# Patient Record
Sex: Female | Born: 1970
Health system: Southern US, Community
[De-identification: ages and names within clinical notes are randomized; demographics above are authoritative.]

## PROBLEM LIST (undated history)

## (undated) ENCOUNTER — Emergency Department (HOSPITAL_COMMUNITY): Admission: EM | Disposition: A | Discharge status: Home / Self Care | Payer: BC Managed Care – PPO

## (undated) ENCOUNTER — Emergency Department (HOSPITAL_COMMUNITY): Admission: EM | Payer: Medicaid Other | Source: Home / Self Care

## (undated) ENCOUNTER — Emergency Department (HOSPITAL_COMMUNITY): Payer: BC Managed Care – PPO

## (undated) DIAGNOSIS — E119 Type 2 diabetes mellitus without complications: Secondary | ICD-10-CM

## (undated) DIAGNOSIS — Z8613 Personal history of malaria: Secondary | ICD-10-CM

## (undated) DIAGNOSIS — I10 Essential (primary) hypertension: Secondary | ICD-10-CM

## (undated) DIAGNOSIS — D61818 Other pancytopenia: Secondary | ICD-10-CM

## (undated) DIAGNOSIS — B1920 Unspecified viral hepatitis C without hepatic coma: Secondary | ICD-10-CM

## (undated) HISTORY — PX: FOOT SURGERY: SHX648

## (undated) HISTORY — DX: Personal history of malaria: Z86.13

## (undated) HISTORY — DX: Unspecified viral hepatitis C without hepatic coma: B19.20

## (undated) HISTORY — DX: Other pancytopenia: D61.818

---

## 2014-11-11 ENCOUNTER — Emergency Department (INDEPENDENT_AMBULATORY_CARE_PROVIDER_SITE_OTHER)
Admission: EM | Admit: 2014-11-11 | Discharge: 2014-11-11 | Disposition: A | Discharge status: Nursing Home (Includes ICF) | Payer: Medicaid Other | Source: Home / Self Care | Attending: Emergency Medicine | Admitting: Emergency Medicine

## 2014-11-11 ENCOUNTER — Emergency Department (HOSPITAL_COMMUNITY)
Admission: EM | Admit: 2014-11-11 | Discharge: 2014-11-11 | Disposition: A | Discharge status: Home / Self Care | Payer: Medicaid Other | Attending: Emergency Medicine | Admitting: Emergency Medicine

## 2014-11-11 ENCOUNTER — Encounter (HOSPITAL_COMMUNITY): Payer: Self-pay | Admitting: Emergency Medicine

## 2014-11-11 ENCOUNTER — Emergency Department (HOSPITAL_COMMUNITY): Payer: Medicaid Other

## 2014-11-11 DIAGNOSIS — R109 Unspecified abdominal pain: Secondary | ICD-10-CM | POA: Insufficient documentation

## 2014-11-11 DIAGNOSIS — D72819 Decreased white blood cell count, unspecified: Secondary | ICD-10-CM

## 2014-11-11 DIAGNOSIS — D696 Thrombocytopenia, unspecified: Secondary | ICD-10-CM

## 2014-11-11 DIAGNOSIS — R161 Splenomegaly, not elsewhere classified: Secondary | ICD-10-CM

## 2014-11-11 DIAGNOSIS — R10819 Abdominal tenderness, unspecified site: Secondary | ICD-10-CM

## 2014-11-11 DIAGNOSIS — D649 Anemia, unspecified: Secondary | ICD-10-CM | POA: Diagnosis not present

## 2014-11-11 DIAGNOSIS — K7689 Other specified diseases of liver: Secondary | ICD-10-CM

## 2014-11-11 DIAGNOSIS — R6881 Early satiety: Secondary | ICD-10-CM

## 2014-11-11 DIAGNOSIS — R140 Abdominal distension (gaseous): Secondary | ICD-10-CM

## 2014-11-11 LAB — I-STAT CHEM 8, ED
BUN: 17 mg/dL (ref 6–23)
CALCIUM ION: 1.17 mmol/L (ref 1.12–1.23)
CREATININE: 0.6 mg/dL (ref 0.50–1.10)
Chloride: 105 mEq/L (ref 96–112)
Glucose, Bld: 109 mg/dL — ABNORMAL HIGH (ref 70–99)
HCT: 32 % — ABNORMAL LOW (ref 36.0–46.0)
Hemoglobin: 10.9 g/dL — ABNORMAL LOW (ref 12.0–15.0)
Potassium: 4 mEq/L (ref 3.7–5.3)
SODIUM: 141 meq/L (ref 137–147)
TCO2: 24 mmol/L (ref 0–100)

## 2014-11-11 LAB — POCT URINALYSIS DIP (DEVICE)
Bilirubin Urine: NEGATIVE
Glucose, UA: NEGATIVE mg/dL
Hgb urine dipstick: NEGATIVE
Ketones, ur: NEGATIVE mg/dL
Leukocytes, UA: NEGATIVE
Nitrite: NEGATIVE
PH: 7 (ref 5.0–8.0)
PROTEIN: NEGATIVE mg/dL
SPECIFIC GRAVITY, URINE: 1.015 (ref 1.005–1.030)
UROBILINOGEN UA: 2 mg/dL — AB (ref 0.0–1.0)

## 2014-11-11 LAB — URINALYSIS, ROUTINE W REFLEX MICROSCOPIC
Bilirubin Urine: NEGATIVE
Glucose, UA: NEGATIVE mg/dL
Ketones, ur: NEGATIVE mg/dL
Leukocytes, UA: NEGATIVE
Nitrite: NEGATIVE
Protein, ur: NEGATIVE mg/dL
SPECIFIC GRAVITY, URINE: 1.017 (ref 1.005–1.030)
UROBILINOGEN UA: 1 mg/dL (ref 0.0–1.0)
pH: 7 (ref 5.0–8.0)

## 2014-11-11 LAB — CBC WITH DIFFERENTIAL/PLATELET
BASOS ABS: 0 10*3/uL (ref 0.0–0.1)
Basophils Relative: 0 % (ref 0–1)
EOS ABS: 0 10*3/uL (ref 0.0–0.7)
Eosinophils Relative: 2 % (ref 0–5)
HEMATOCRIT: 29.9 % — AB (ref 36.0–46.0)
Hemoglobin: 9.5 g/dL — ABNORMAL LOW (ref 12.0–15.0)
LYMPHS ABS: 0.6 10*3/uL — AB (ref 0.7–4.0)
LYMPHS PCT: 38 % (ref 12–46)
MCH: 23.9 pg — AB (ref 26.0–34.0)
MCHC: 31.8 g/dL (ref 30.0–36.0)
MCV: 75.1 fL — ABNORMAL LOW (ref 78.0–100.0)
MONO ABS: 0.1 10*3/uL (ref 0.1–1.0)
Monocytes Relative: 7 % (ref 3–12)
NEUTROS ABS: 0.8 10*3/uL — AB (ref 1.7–7.7)
Neutrophils Relative %: 53 % (ref 43–77)
Platelets: 38 10*3/uL — ABNORMAL LOW (ref 150–400)
RBC: 3.98 MIL/uL (ref 3.87–5.11)
RDW: 15 % (ref 11.5–15.5)
WBC: 1.5 10*3/uL — ABNORMAL LOW (ref 4.0–10.5)

## 2014-11-11 LAB — COMPREHENSIVE METABOLIC PANEL
ALBUMIN: 3.6 g/dL (ref 3.5–5.2)
ALT: 30 U/L (ref 0–35)
AST: 39 U/L — ABNORMAL HIGH (ref 0–37)
Alkaline Phosphatase: 55 U/L (ref 39–117)
Anion gap: 12 (ref 5–15)
BUN: 16 mg/dL (ref 6–23)
CALCIUM: 9.1 mg/dL (ref 8.4–10.5)
CO2: 24 mEq/L (ref 19–32)
Chloride: 104 mEq/L (ref 96–112)
Creatinine, Ser: 0.59 mg/dL (ref 0.50–1.10)
GFR calc Af Amer: >90 mL/min (ref >90)
GFR calc non Af Amer: >90 mL/min (ref >90)
GLUCOSE: 104 mg/dL — AB (ref 70–99)
Potassium: 4.1 mEq/L (ref 3.7–5.3)
Sodium: 140 mEq/L (ref 137–147)
TOTAL PROTEIN: 8.3 g/dL (ref 6.0–8.3)
Total Bilirubin: 0.4 mg/dL (ref 0.3–1.2)

## 2014-11-11 LAB — URINE MICROSCOPIC-ADD ON

## 2014-11-11 LAB — POC OCCULT BLOOD, ED: FECAL OCCULT BLD: NEGATIVE

## 2014-11-11 LAB — POCT PREGNANCY, URINE: PREG TEST UR: NEGATIVE

## 2014-11-11 LAB — LIPASE, BLOOD: LIPASE: 32 U/L (ref 11–59)

## 2014-11-11 MED ORDER — HYDROCODONE-ACETAMINOPHEN 5-325 MG PO TABS
1.0000 | ORAL_TABLET | ORAL | Status: DC | PRN
Start: 2014-11-11 — End: 2014-12-08

## 2014-11-11 MED ORDER — SODIUM CHLORIDE 0.9 % IV BOLUS (SEPSIS)
1000.0000 mL | Freq: Once | INTRAVENOUS | Status: AC
  Administered 2014-11-11: 1000 mL via INTRAVENOUS

## 2014-11-11 MED ORDER — IOHEXOL 300 MG/ML  SOLN
100.0000 mL | Freq: Once | INTRAMUSCULAR | Status: AC | PRN
  Administered 2014-11-11: 100 mL via INTRAVENOUS

## 2014-11-11 MED ORDER — MORPHINE SULFATE 4 MG/ML IJ SOLN
4.0000 mg | Freq: Once | INTRAMUSCULAR | Status: AC
  Administered 2014-11-11: 4 mg via INTRAVENOUS
  Filled 2014-11-11: qty 1

## 2014-11-11 MED ORDER — ONDANSETRON HCL 4 MG/2ML IJ SOLN
4.0000 mg | Freq: Once | INTRAMUSCULAR | Status: AC
  Administered 2014-11-11: 4 mg via INTRAVENOUS
  Filled 2014-11-11: qty 2

## 2014-11-11 MED ORDER — IOHEXOL 300 MG/ML  SOLN
25.0000 mL | INTRAMUSCULAR | Status: AC
  Administered 2014-11-11: 25 mL via ORAL

## 2014-11-11 NOTE — ED Notes (Signed)
Pt finished oral contrast. CT made aware.

## 2014-11-11 NOTE — ED Notes (Signed)
Pt instructed via interpreter line to follow up. Pt with representative from refugee agency. Who verbalizes understanding as well as via Arts administrator. Pt is ambulatory.

## 2014-11-11 NOTE — ED Provider Notes (Signed)
CSN: [id number]     Arrival date & time 11/11/14  1217 History   First MD Initiated Contact with Patient 11/11/14 1247     Chief Complaint  Patient presents with  . Abdominal Pain     (Consider location/radiation/quality/duration/timing/severity/associated sxs/prior Treatment) HPI  Katherine Garza is a 43 y.o. female presenting with intermittent abdominal pain and distention that she has had for one year. Most recent episode started October 23. Patient states she is in pain now. She denies any nausea, vomiting. When she has the pain she endorses diarrhea with bloody mucus. Otherwise she her stool is regular every other day. Patient denies any urinary or vaginal complaints. Patient states she has a decreased appetite and early satiety. Patient endorses tactile fevers and chills. No weight loss. LMP on 11/54/2015. Stated this was first menses in several months. Patient recently came to [place] in October from [place] in as refugee for 19 years.  History obtained via Optometrist. Patient only speaks Swahili. History obtained from patient. Family at bedside.  History reviewed. No pertinent past medical history. History reviewed. No pertinent past surgical history. No family history on file. History  Substance Use Topics  . Smoking status: Never Smoker   . Smokeless tobacco: Not on file  . Alcohol Use: No   OB History    No data available     Review of Systems  Constitutional: Positive for fever and chills.  Respiratory: Negative for shortness of breath.   Cardiovascular: Negative for chest pain.  Gastrointestinal: Positive for abdominal pain, diarrhea and abdominal distention. Negative for nausea and vomiting.  Genitourinary: Negative for dysuria and hematuria.  Musculoskeletal: Negative for back pain.  Skin: Negative for rash.  Neurological: Negative for headaches.      Allergies  Review of patient's allergies indicates no known allergies.  Home Medications   Prior to  Admission medications   Medication Sig Start Date End Date Taking? Authorizing Provider  HYDROcodone-acetaminophen (NORCO/VICODIN) 5-325 MG per tablet Take 1-2 tablets by mouth every 4 (four) hours as needed for moderate pain or severe pain. 11/11/14   Jordan Hawks L Tyrrell Stephens, PA-C   BP 118/73 mmHg  Pulse 77  Temp(Src) 97.8 F (36.6 C) (Oral)  Resp 19  SpO2 100%  LMP 11/03/2014 Physical Exam  Constitutional: She appears well-developed and well-nourished. No distress.  HENT:  Head: Normocephalic and atraumatic.  Eyes: Conjunctivae and EOM are normal. Right eye exhibits no discharge. Left eye exhibits no discharge.  Cardiovascular: Normal rate, regular rhythm and normal heart sounds.   Pulmonary/Chest: Effort normal and breath sounds normal. No respiratory distress. She has no wheezes.  Abdominal: Soft. Bowel sounds are normal.  Abdomen soft with diffuse tenderness worse in left lower quadrant. Patient with distention. Enlarged spleen noted but no masses felt. No back tenderness or CVA tenderness.  Neurological: She is alert. She exhibits normal muscle tone. Coordination normal.  Skin: Skin is warm and dry. She is not diaphoretic.  Nursing note and vitals reviewed.   ED Course  Procedures (including critical care time) Labs Review Labs Reviewed  CBC WITH DIFFERENTIAL - Abnormal; Notable for the following:    WBC 1.5 (*)    Hemoglobin 9.5 (*)    HCT 29.9 (*)    MCV 75.1 (*)    MCH 23.9 (*)    Platelets 38 (*)    Neutro Abs 0.8 (*)    Lymphs Abs 0.6 (*)    All other components within normal limits  COMPREHENSIVE METABOLIC PANEL - Abnormal; Notable  for the following:    Glucose, Bld 104 (*)    AST 39 (*)    All other components within normal limits  URINALYSIS, ROUTINE W REFLEX MICROSCOPIC - Abnormal; Notable for the following:    Hgb urine dipstick SMALL (*)    All other components within normal limits  I-STAT CHEM 8, ED - Abnormal; Notable for the following:    Glucose, Bld 109  (*)    Hemoglobin 10.9 (*)    HCT 32.0 (*)    All other components within normal limits  LIPASE, BLOOD  URINE MICROSCOPIC-ADD ON  POC OCCULT BLOOD, ED    Imaging Review Ct Abdomen Pelvis W Contrast  11/11/2014   CLINICAL DATA:  Acute left-sided abdominal pain.  EXAM: CT ABDOMEN AND PELVIS WITH CONTRAST  TECHNIQUE: Multidetector CT imaging of the abdomen and pelvis was performed using the standard protocol following bolus administration of intravenous contrast.  CONTRAST:  169mL OMNIPAQUE IOHEXOL 300 MG/ML  SOLN  COMPARISON:  None.  FINDINGS: Mild degenerative disc disease is noted at L3-4. Visualized lung bases appear normal.  No gallstones are noted. Gallbladder is mildly distended, but no surrounding inflammation or gallbladder wall thickening is noted. Nodular hepatic margins are noted suggesting hepatic cirrhosis. Portal veins are dilated but patent. Splenic vein is also dilated. Severe splenomegaly is noted. Pancreas appears normal. Adrenal glands appear normal. No hydronephrosis or renal obstruction is noted. Bilateral simple renal cysts are noted. Small nonobstructive calculus is noted in upper pole collecting system of left kidney. Multiple small ill-defined low densities are noted in the liver, as well as ill-defined abnormality seen in anterior segment of right hepatic lobe. The left kidney is displaced by the splenomegaly. The appendix appears normal. There is no evidence of bowel obstruction. No abnormal fluid collection is noted. Urinary bladder is decompressed. Uterus appears normal. Ovaries appear normal. No significant adenopathy is noted.  IMPRESSION: Nodular hepatic margins are noted suggesting hepatic cirrhosis. Severe splenomegaly is noted with dilatation of the portal and splenic vein suggesting portal hypertension. Multiple small ill-defined hypodensities are noted in hepatic parenchyma, as well as ill-defined complex abnormality seen in anterior segment of right hepatic lobe. MRI is  recommended to rule out possible underlying hepatocellular carcinoma.  Small nonobstructive left renal calculus is noted. Bilateral renal cysts are noted. No hydronephrosis or renal obstruction is noted.   Electronically Signed   By: Sabino Dick M.D.   On: 11/11/2014 16:16     EKG Interpretation None      MDM   Final diagnoses:  Abdominal tenderness  Splenomegaly  Liver nodule  Mild anemia  Leukopenia   Pt is [place] refugee who has recently come to the [place] and without prior medical work up for intermittent left sided abdominal pain with abdominal distention for one year and ocassional bloody stool. Pt in no distress. VSS. Diffuse abdominal tenderness worse on left with enlarged spleen noted. No rebound or signs of peritonitis. Pt labs with mild anemia, leukocytosis, thrombocytosis and no electrolyte abnormalities. Mild elevation of AST but no other LFT or kidney function abnormalities. Lipase negative and UA without signs of infection. Hemoccult negative. CT with severe splenomegaly and liver changes suggesting cirrhosis and portal hypertension. Also noted are complex abnormalities in hepatic parenchyma possibly representing hepatocellular carcinoma. Pt was informed of these findings via phone translator. Pt without a PCP. Pt to follow up with the wellness center for further evaluation of these findings. A case management intern, Claiborne Billings, has been assigned to the case  to help facilitate pt getting her pain medications and establishing follow up as well as getting in contact with the refugee agency the pt have been working with. It has been determined that no acute conditions requiring further emergency intervention are present at this time. Patient is afebrile, nontoxic, and in no acute distress. Patient is appropriate for outpatient management and is stable for discharge.  Discussed return precautions with patient. Discussed all results and patient verbalizes understanding and agrees with  plan.  Case has been discussed with Dr. Mingo Amber who agrees with the above plan and to discharge.     Pura Spice, PA-C 11/12/14 1108  Evelina Bucy, MD 11/12/14 1245

## 2014-11-11 NOTE — Discharge Instructions (Signed)
Return to the emergency room with worsening of symptoms, new symptoms or with symptoms that are concerning. Ibuprofen 400mg  (2 tablets 200mg ) every 5-6 hours for 3-5 days and then as needed for pain.\ Norco for severe pain. Do not operate machinery, drive or drink alcohol while taking narcotics or muscle relaxers. Call to make appointment with the wellness center as soon as possible.   IMPRESSION: Nodular hepatic margins are noted suggesting hepatic cirrhosis. Severe splenomegaly is noted with dilatation of the portal and splenic vein suggesting portal hypertension. Multiple small ill-defined hypodensities are noted in hepatic parenchyma, as well as ill-defined complex abnormality seen in anterior segment of right hepatic lobe. MRI is recommended to rule out possible underlying hepatocellular carcinoma.  Small nonobstructive left renal calculus is noted. Bilateral renal cysts are noted. No hydronephrosis or renal obstruction is noted.

## 2014-11-11 NOTE — ED Notes (Addendum)
Pt ONLY SPEAKS SWAHILI.  HAS pain in left abd, states has been hurting since October 23-- that is when she came to the [place], from [place] as a refugee for 19 years. No nausea/vomiting, when having pain, pt states has diarrhea. Intermittent.  States has blood in stool-- red mixed with mucus.

## 2014-11-11 NOTE — ED Notes (Addendum)
Via Swahili phone interpreter Pt c/o intermittent abd pain onset 1 year; recently flared up on 11/21 and has been more constant Arrived to [place] recently; no PCP LMP = 11/24; reports its first period she has seen in 7 months Alert, no signs of acute distress.

## 2014-11-11 NOTE — ED Provider Notes (Signed)
CSN: [id number]     Arrival date & time 11/11/14  1019 History   First MD Initiated Contact with Patient 11/11/14 1040     Chief Complaint  Patient presents with  . Abdominal Pain   (Consider location/radiation/quality/duration/timing/severity/associated sxs/prior Treatment) HPI Comments: 43 year old female from [place] has been in the [place] for one month. She is complaining of chronic intermittent abdominal pain and swelling. Her most recent episode began approximately 10/31/2014. She developed a distended abdomen with pain primarily to the left hemiabdomen and epigastrium. The pain is unchanged in the past week. She has not sought medical attention for this in the past. She denies nausea, vomiting. She states she has a bowel movement approximately every other day. She has decreased appetite and early satiety. Denies weight loss. LMP was on 11/03/2014, however that was the first menses in 7 months.   History reviewed. No pertinent past medical history. History reviewed. No pertinent past surgical history. No family history on file. History  Substance Use Topics  . Smoking status: Never Smoker   . Smokeless tobacco: Not on file  . Alcohol Use: No   OB History    No data available     Review of Systems  Constitutional: Positive for activity change and fatigue. Negative for fever.  HENT: Negative.   Respiratory: Negative for cough and shortness of breath.   Gastrointestinal: Positive for abdominal pain and abdominal distention. Negative for vomiting, diarrhea and constipation.  Genitourinary: Negative.  Negative for dysuria, urgency, frequency, vaginal bleeding and pelvic pain.  Skin: Negative.     Allergies  Review of patient's allergies indicates no known allergies.  Home Medications   Prior to Admission medications   Not on File   BP 149/87 mmHg  Pulse 79  Temp(Src) 99 F (37.2 C) (Oral)  Resp 14  SpO2 98%  LMP 11/03/2014 Physical Exam  Constitutional: She  appears well-developed. No distress.  Eyes: EOM are normal.  Neck: Normal range of motion. Neck supple.  Cardiovascular: Normal rate, regular rhythm and normal heart sounds.   Pulmonary/Chest: Effort normal and breath sounds normal. No respiratory distress. She has no wheezes. She has no rales.  Abdominal: Soft. She exhibits distension. There is tenderness. There is no rebound and no guarding.  Abdomen percusses tympanic. Tenderness left hemiabdomen, lesser to right. No RLQ tenderness.  Musculoskeletal: She exhibits no edema.  Neurological: She is alert. She exhibits normal muscle tone.  Skin: Skin is warm and dry.  Psychiatric: She has a normal mood and affect.  Nursing note and vitals reviewed.   ED Course  Procedures (including critical care time) Labs Review Labs Reviewed  POCT URINALYSIS DIP (DEVICE) - Abnormal; Notable for the following:    Urobilinogen, UA 2.0 (*)    All other components within normal limits  POCT PREGNANCY, URINE   Results for orders placed or performed during the hospital encounter of 11/11/14  POCT urinalysis dip (device)  Result Value Ref Range   Glucose, UA NEGATIVE NEGATIVE mg/dL   Bilirubin Urine NEGATIVE NEGATIVE   Ketones, ur NEGATIVE NEGATIVE mg/dL   Specific Gravity, Urine 1.015 1.005 - 1.030   Hgb urine dipstick NEGATIVE NEGATIVE   pH 7.0 5.0 - 8.0   Protein, ur NEGATIVE NEGATIVE mg/dL   Urobilinogen, UA 2.0 (H) 0.0 - 1.0 mg/dL   Nitrite NEGATIVE NEGATIVE   Leukocytes, UA NEGATIVE NEGATIVE  Pregnancy, urine POC  Result Value Ref Range   Preg Test, Ur NEGATIVE NEGATIVE     Imaging Review  No results found.   MDM   1. Abdominal pain in female   2. Abdominal distension   3. Early satiety      Although the assistance with the interpreter is helpful there seems to be some loss in translation. Uncertain etiology for abdominal pain. Consider ileus, constipation, parasitic dz, mass, partial obstruction. Transfer to to the ED for  adequate evaluation via shuttle.     Janne Napoleon, NP 11/11/14 1124

## 2014-11-12 LAB — PATHOLOGIST SMEAR REVIEW

## 2014-11-27 ENCOUNTER — Ambulatory Visit (INDEPENDENT_AMBULATORY_CARE_PROVIDER_SITE_OTHER): Payer: Medicaid Other | Admitting: Internal Medicine

## 2014-11-27 ENCOUNTER — Encounter: Payer: Self-pay | Admitting: Internal Medicine

## 2014-11-27 VITALS — BP 139/78 | HR 86 | Temp 98.6°F | Ht 63.4 in | Wt 156.8 lb

## 2014-11-27 DIAGNOSIS — R161 Splenomegaly, not elsewhere classified: Secondary | ICD-10-CM

## 2014-11-27 DIAGNOSIS — R109 Unspecified abdominal pain: Secondary | ICD-10-CM

## 2014-11-27 DIAGNOSIS — K219 Gastro-esophageal reflux disease without esophagitis: Secondary | ICD-10-CM | POA: Insufficient documentation

## 2014-11-27 DIAGNOSIS — D61818 Other pancytopenia: Secondary | ICD-10-CM | POA: Insufficient documentation

## 2014-11-27 DIAGNOSIS — R1012 Left upper quadrant pain: Secondary | ICD-10-CM

## 2014-11-27 DIAGNOSIS — K7460 Unspecified cirrhosis of liver: Secondary | ICD-10-CM

## 2014-11-27 DIAGNOSIS — R1013 Epigastric pain: Secondary | ICD-10-CM

## 2014-11-27 HISTORY — DX: Splenomegaly, not elsewhere classified: R16.1

## 2014-11-27 HISTORY — DX: Unspecified cirrhosis of liver: K74.60

## 2014-11-27 LAB — COMPLETE METABOLIC PANEL WITH GFR
ALBUMIN: 3.9 g/dL (ref 3.5–5.2)
ALT: 30 U/L (ref 0–35)
AST: 35 U/L (ref 0–37)
Alkaline Phosphatase: 55 U/L (ref 39–117)
BUN: 16 mg/dL (ref 6–23)
CALCIUM: 9.2 mg/dL (ref 8.4–10.5)
CO2: 26 meq/L (ref 19–32)
Chloride: 103 mEq/L (ref 96–112)
Creat: 0.6 mg/dL (ref 0.50–1.10)
Glucose, Bld: 80 mg/dL (ref 70–99)
POTASSIUM: 3.9 meq/L (ref 3.5–5.3)
SODIUM: 138 meq/L (ref 135–145)
TOTAL PROTEIN: 8.2 g/dL (ref 6.0–8.3)
Total Bilirubin: 0.5 mg/dL (ref 0.2–1.2)

## 2014-11-27 LAB — C-REACTIVE PROTEIN: CRP: <0.5 mg/dL (ref <0.60)

## 2014-11-27 MED ORDER — ESOMEPRAZOLE MAGNESIUM 20 MG PO CPDR: 20.0000 mg | DELAYED_RELEASE_CAPSULE | Freq: Every day | ORAL | Status: DC

## 2014-11-27 NOTE — Assessment & Plan Note (Addendum)
Unclear etiology, however, suspect this is most likely related to severe splenomegaly (palpable on exam) and sequestration. Typically this only effects RBC's and platelets, however, can effect WBC's as well. May also be 2/2 infection (ie: leishmaniasis, malaria, hepatitis? etc.). No lymphadenopathy on exam or seen on CT abdomen, lymphoma less likely. Patient w/ microcytic anemia (MCV 75) and smear significant for pancytopenia (including neutropenia) and normocytic anemia on pathologist review. MDS less likely given MCV (not macrocytic anemia). HIV also significant possibility. -Repeat CBC w/ diff today -Follow up w/ hematology (Dr. Beryle Beams) in 12/2014.  -HIV pending

## 2014-11-27 NOTE — Assessment & Plan Note (Addendum)
Most likely related to portal HTN 2/2 hepatic cirrhosis of unknown etiology, given significant dilatation of the portal and splenic veins seen on CT abdomen. Other possibilities include infectious causes as stated in hepatic cirrhosis section, however, appears to be a primary hepatic issue given imaging results.  -Hepatitis panel, HIV, Tb Quant gold -GI referral -Heme referral

## 2014-11-27 NOTE — Progress Notes (Signed)
Subjective:   Patient ID: Katherine Garza female   DOB: [birth year] 43 y.o.   MRN: [id number]  HPI: [name] is a  43 y.o. Swahili-speaking female w/ unknown PMHx, presents to the clinic today for a follow-up visit after being seen in the ED on 11/11/14. Patient was initially complaining of intermittent abdominal pain and distension which has been going on for about a year, accompanied by diarrhea and scant blood in her stools. A CT scan of the abdomen was done at that time which showed nodular hepatic margins suggestive of hepatic cirrhosis, severe splenomegaly with dilatation of the portal and splenic vein suggestive of portal hypertension. Also showed multiple small ill-defined hypodensities in hepatic parenchyma, as well an as ill-defined complex abnormality seen in anterior segment of the right hepatic lobe.   Today, the patient claims she is no longer having abdominal pain, diarrhea, or blood in her stools. Her only complaint at this time is "burning" in her chest. She denies associated SOB, diaphoresis, or palpitations. She says there is no association w/ food or exertion, but does say it is worse when she lies flat. No recent fever, chills, nausea, vomiting. She also denies any recent weight loss or night sweats. No cough or hemoptysis.   The patient has an unknown past medical history but does state she has had malaria several times (could not specify the number of times) and has been hospitalized twice for this. She also says she had hospitalization about 1 year ago for "pain with swallowing" and was given several injections and oral medications to take, the specifics of this are unclear. 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 Currently she is doing house work, according to the patient.    Past Medical History  Diagnosis Date  . History of malaria     Multiple episodes w/ 2 hospitalizations   Current  Outpatient Prescriptions  Medication Sig Dispense Refill  . esomeprazole (NEXIUM) 20 MG capsule Take 1 capsule (20 mg total) by mouth daily. 30 capsule 1  . HYDROcodone-acetaminophen (NORCO/VICODIN) 5-325 MG per tablet Take 1-2 tablets by mouth every 4 (four) hours as needed for moderate pain or severe pain. 20 tablet 0   No current facility-administered medications for this visit.    Review of Systems: General: Denies fever, chills, diaphoresis, appetite change and fatigue.  Respiratory: Denies SOB, DOE, cough, and wheezing.   Cardiovascular: Positive for chest pain ("burning"). Denies palpitations.  Gastrointestinal: Denies nausea, vomiting, abdominal pain, and diarrhea.  Genitourinary: Denies dysuria, increased frequency, and flank pain. Endocrine: Denies hot or cold intolerance, polyuria, and polydipsia. Musculoskeletal: Denies myalgias, back pain, joint swelling, arthralgias and gait problem.  Skin: Denies pallor, rash and wounds.  Neurological: Denies dizziness, seizures, syncope, weakness, lightheadedness, numbness and headaches.  Psychiatric/Behavioral: Denies mood changes, and sleep disturbances .  Objective:   Physical Exam: Filed Vitals:   11/27/14 1005  BP: 139/78  Pulse: 86  Temp: 98.6 F (37 C)  TempSrc: Oral  SpO2: 100%    General: Very pleasant thin-appearing african female, alert, cooperative, NAD. ONLY Swahili speaking. HEENT: PERRL, EOMI. Moist mucus membranes. Multiple dental caries. No pharyngeal erythema. Whitish lesion on the tip of the tongue.  Neck: Full range of motion without pain, supple, no lymphadenopathy or carotid bruits Lungs: Clear to ascultation bilaterally, normal work of respiration, no wheezes, rales, rhonchi Heart: RRR, no murmurs, gallops, or rubs Breast: Irregular breast tissue,  no obvious lumps or masses. No axillary lymphadenopathy.  Abdomen: Soft, non-tender, non-distended, BS +. Significant splenomegaly extending ~8-10 cm below  ribs. Non-tender. Liver not palpated.  Extremities: No cyanosis, clubbing, or edema. Strong distal pulses in all extremities.  Neurologic: Alert & oriented X3, cranial nerves II-XII intact, strength grossly intact, sensation intact to light touch   Assessment & Plan:   Please see problem based assessment and plan.

## 2014-11-27 NOTE — Assessment & Plan Note (Addendum)
Patient complaining of chest pain/burning, initially, but after further investigation, seems to be epigastric in nature. Says the pain is located centrally and is worse when lying down. Denies SOB, palpitations, dizziness, lightheadedness, or diaphoresis. No recent cough, not exacerbated by deep inspiration or exertion. Chest examination including heart and lungs as well as breast examination unremarkable.  -Start Nexium 20 mg daily for now -If no improvement in 4-6 weeks, can consider further workup for H. Pylori (patient from [place], >10% prevalence).  -GI referral

## 2014-11-27 NOTE — Assessment & Plan Note (Addendum)
Nodular liver on CT w/ severe splenomegaly, likely 2/2 portal hypertension. No obvious jaundice/icterus or other stigmata of cirrhosis. Most recent LFT's generally normal aside from AST mildly elevated to 39. 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 Some question of [place] given irregularities seen on CT as well. Cannot rule out other infectious causes of liver disease as well such as parasitic infections (schistosomiasis, echinococcus, visceral leishmaniasis) however, eosinophils during ED visit on 11/11/14 were 0. Also possibly related to multiple malarial infections (or incompletely treated malarial infection) in relation to splenomegaly ("tropical splenomegaly syndrome") however this is quite rare and unlikely.  -GI referral for further management. Will likely need MRI to further characterize nodularity and abnormality in anterior segment of the right hepatic lobe.  -Repeat CMP today -Hepatitis workup; Hep E Ab, Hep C Ab, and Hep B surface Ag. -Tb Quantiferon gold assay -HIV -ESR, CRP

## 2014-11-27 NOTE — Assessment & Plan Note (Signed)
Chief complaint when seen in the ED on 11/11/14, most likely related to severe splenomegaly. Has almost completely resolved at this time, according to the patient. No recent diarrhea or blood in stool.  -RTC in 4 weeks -GI referral

## 2014-11-27 NOTE — Patient Instructions (Signed)
General Instructions:  1. Please schedule a follow up appointment for 2-4 weeks.   We will schedule you to see a GI doctor as you have an enlarged spleen and changes in your liver.   2. Please take all medications as prescribed. Take Nexium 20 mg daily for chest pain.  3. If you have worsening of your symptoms or new symptoms arise, please call the clinic ([phone]), or go to the ER immediately if symptoms are severe.   Please bring your medicines with you each time you come to clinic.  Medicines may include prescription medications, over-the-counter medications, herbal remedies, eye drops, vitamins, or other pills.

## 2014-11-28 LAB — CBC WITH DIFFERENTIAL/PLATELET
BASOS ABS: 0 10*3/uL (ref 0.0–0.1)
BASOS PCT: 0 % (ref 0–1)
Eosinophils Absolute: 0 10*3/uL (ref 0.0–0.7)
Eosinophils Relative: 3 % (ref 0–5)
HCT: 33.2 % — ABNORMAL LOW (ref 36.0–46.0)
Hemoglobin: 10.8 g/dL — ABNORMAL LOW (ref 12.0–15.0)
Lymphocytes Relative: 42 % (ref 12–46)
Lymphs Abs: 0.7 10*3/uL (ref 0.7–4.0)
MCH: 24.3 pg — ABNORMAL LOW (ref 26.0–34.0)
MCHC: 32.5 g/dL (ref 30.0–36.0)
MCV: 74.6 fL — AB (ref 78.0–100.0)
Monocytes Absolute: 0.1 10*3/uL (ref 0.1–1.0)
Monocytes Relative: 7 % (ref 3–12)
NEUTROS ABS: 0.8 10*3/uL — AB (ref 1.7–7.7)
NEUTROS PCT: 48 % (ref 43–77)
PLATELETS: 34 10*3/uL — AB (ref 150–400)
RBC: 4.45 MIL/uL (ref 3.87–5.11)
RDW: 15.6 % — ABNORMAL HIGH (ref 11.5–15.5)
WBC: 1.6 10*3/uL — AB (ref 4.0–10.5)

## 2014-11-28 LAB — HIV ANTIBODY (ROUTINE TESTING W REFLEX): HIV 1&2 Ab, 4th Generation: NONREACTIVE

## 2014-11-28 LAB — SEDIMENTATION RATE: SED RATE: 38 mm/h — AB (ref 0–22)

## 2014-11-28 LAB — HEPATITIS C ANTIBODY: HCV AB: REACTIVE — AB

## 2014-11-28 LAB — HEPATITIS B SURFACE ANTIGEN: Hepatitis B Surface Ag: NEGATIVE

## 2014-11-30 LAB — HEPATITIS C RNA QUANTITATIVE
HCV Quantitative Log: 5.63 {Log} — ABNORMAL HIGH (ref <1.18)
HCV Quantitative: 426353 IU/mL — ABNORMAL HIGH (ref <15)

## 2014-12-01 ENCOUNTER — Telehealth: Payer: Self-pay | Admitting: *Deleted

## 2014-12-01 DIAGNOSIS — R1013 Epigastric pain: Secondary | ICD-10-CM

## 2014-12-01 LAB — QUANTIFERON TB GOLD ASSAY (BLOOD)
Mitogen value: 0.17 IU/mL
QUANTIFERON TB AG MINUS NIL: 0 [IU]/mL
Quantiferon Nil Value: 0.09 IU/mL
TB AG VALUE: 0.04 [IU]/mL

## 2014-12-01 MED ORDER — ESOMEPRAZOLE MAGNESIUM 20 MG PO CPDR: 20.0000 mg | DELAYED_RELEASE_CAPSULE | Freq: Every day | ORAL | Status: DC

## 2014-12-01 NOTE — Telephone Encounter (Signed)
Received PA request from pt's pharmacy for the esomeprazole magnesium 20mg  caps.  Brand name is the preferred.  Brand name sent to pharmacy, will send prescribing MD to make change in the pt's chart.Despina Hidden Cassady12/22/20159:19 AM

## 2014-12-05 LAB — HEPATITIS E ANTIBODY, IGG

## 2014-12-07 ENCOUNTER — Telehealth: Payer: Self-pay | Admitting: *Deleted

## 2014-12-07 NOTE — Telephone Encounter (Signed)
Claiborne Billings with [facility] called to request NPI number.  NPI number given x 1 visit.  Pt will need to contact medicaid to change name of provider on her card.  Derl Barrow, RN

## 2014-12-08 ENCOUNTER — Encounter (HOSPITAL_COMMUNITY): Payer: Self-pay | Admitting: Emergency Medicine

## 2014-12-08 ENCOUNTER — Emergency Department (HOSPITAL_COMMUNITY)
Admission: EM | Admit: 2014-12-08 | Discharge: 2014-12-08 | Disposition: A | Discharge status: Home / Self Care | Payer: Medicaid Other | Source: Home / Self Care | Attending: Family Medicine | Admitting: Family Medicine

## 2014-12-08 DIAGNOSIS — K0889 Other specified disorders of teeth and supporting structures: Secondary | ICD-10-CM

## 2014-12-08 DIAGNOSIS — K047 Periapical abscess without sinus: Secondary | ICD-10-CM

## 2014-12-08 DIAGNOSIS — K088 Other specified disorders of teeth and supporting structures: Secondary | ICD-10-CM

## 2014-12-08 MED ORDER — HYDROCODONE-ACETAMINOPHEN 5-325 MG PO TABS
1.0000 | ORAL_TABLET | ORAL | Status: DC | PRN
Start: 2014-12-08 — End: 2015-02-24

## 2014-12-08 MED ORDER — AMOXICILLIN 500 MG PO CAPS: 1000.0000 mg | ORAL_CAPSULE | Freq: Two times a day (BID) | ORAL | Status: DC

## 2014-12-08 NOTE — Discharge Instructions (Signed)
Dental Abscess A dental abscess is a collection of infected fluid (pus) from a bacterial infection in the inner part of the tooth (pulp). It usually occurs at the end of the tooth's root.  CAUSES   Severe tooth decay.  Trauma to the tooth that allows bacteria to enter into the pulp, such as a broken or chipped tooth. SYMPTOMS   Severe pain in and around the infected tooth.  Swelling and redness around the abscessed tooth or in the mouth or face.  Tenderness.  Pus drainage.  Bad breath.  Bitter taste in the mouth.  Difficulty swallowing.  Difficulty opening the mouth.  Nausea.  Vomiting.  Chills.  Swollen neck glands. DIAGNOSIS   A medical and dental history will be taken.  An examination will be performed by tapping on the abscessed tooth.  X-rays may be taken of the tooth to identify the abscess. TREATMENT The goal of treatment is to eliminate the infection. You may be prescribed antibiotic medicine to stop the infection from spreading. A root canal may be performed to save the tooth. If the tooth cannot be saved, it may be pulled (extracted) and the abscess may be drained.  HOME CARE INSTRUCTIONS  Only take over-the-counter or prescription medicines for pain, fever, or discomfort as directed by your caregiver.  Rinse your mouth (gargle) often with salt water ( tsp salt in 8 oz [250 ml] of warm water) to relieve pain or swelling.  Do not drive after taking pain medicine (narcotics).  Do not apply heat to the outside of your face.  Return to your dentist for further treatment as directed. SEEK MEDICAL CARE IF:  Your pain is not helped by medicine.  Your pain is getting worse instead of better. SEEK IMMEDIATE MEDICAL CARE IF:  You have a fever or persistent symptoms for more than 2-3 days.  You have a fever and your symptoms suddenly get worse.  You have chills or a very bad headache.  You have problems breathing or swallowing.  You have trouble  opening your mouth.  You have swelling in the neck or around the eye. Document Released: 11/27/2005 Document Revised: 08/21/2012 Document Reviewed: 03/07/2011 [facility] Patient Information 2015 [place], [place]. This information is not intended to replace advice given to you by your health care provider. Make sure you discuss any questions you have with your health care provider.  Dental Pain A tooth ache may be caused by cavities (tooth decay). Cavities expose the nerve of the tooth to air and hot or cold temperatures. It may come from an infection or abscess (also called a boil or furuncle) around your tooth. It is also often caused by dental caries (tooth decay). This causes the pain you are having. DIAGNOSIS  Your caregiver can diagnose this problem by exam. TREATMENT   If caused by an infection, it may be treated with medications which kill germs (antibiotics) and pain medications as prescribed by your caregiver. Take medications as directed.  Only take over-the-counter or prescription medicines for pain, discomfort, or fever as directed by your caregiver.  Whether the tooth ache today is caused by infection or dental disease, you should see your dentist as soon as possible for further care. SEEK MEDICAL CARE IF: The exam and treatment you received today has been provided on an emergency basis only. This is not a substitute for complete medical or dental care. If your problem worsens or new problems (symptoms) appear, and you are unable to meet with your dentist, call or  return to this location. SEEK IMMEDIATE MEDICAL CARE IF:   You have a fever.  You develop redness and swelling of your face, jaw, or neck.  You are unable to open your mouth.  You have severe pain uncontrolled by pain medicine. MAKE SURE YOU:   Understand these instructions.  Will watch your condition.  Will get help right away if you are not doing well or get worse. Document Released: 11/27/2005 Document  Revised: 02/19/2012 Document Reviewed: 07/15/2008 [facility] Patient Information 2015 [place], [place]. This information is not intended to replace advice given to you by your health care provider. Make sure you discuss any questions you have with your health care provider.

## 2014-12-08 NOTE — ED Provider Notes (Signed)
CSN: [id number]     Arrival date & time 12/08/14  1307 History   First MD Initiated Contact with Patient 12/08/14 1344     No chief complaint on file.  (Consider location/radiation/quality/duration/timing/severity/associated sxs/prior Treatment) HPI Comments: 43 year old female who speaks Swahili only is here with an interpreter. Her complaint is that of pain and swelling to the right jaw and lower tooth. It began approximately 2-3 days ago.  Patient is a 43 y.o. female presenting with abscess.  Abscess   Past Medical History  Diagnosis Date  . History of malaria     Multiple episodes w/ 2 hospitalizations   No past surgical history on file. No family history on file. History  Substance Use Topics  . Smoking status: Never Smoker   . Smokeless tobacco: Not on file  . Alcohol Use: No   OB History    No data available     Review of Systems  Constitutional: Negative.   HENT: Positive for dental problem and facial swelling. Negative for congestion and sore throat.   Respiratory: Negative.   Gastrointestinal: Negative.     Allergies  Review of patient's allergies indicates no known allergies.  Home Medications   Prior to Admission medications   Medication Sig Start Date End Date Taking? Authorizing Provider  amoxicillin (AMOXIL) 500 MG capsule Take 2 capsules (1,000 mg total) by mouth 2 (two) times daily. 12/08/14   Janne Napoleon, NP  esomeprazole (NEXIUM) 20 MG capsule Take 1 capsule (20 mg total) by mouth daily. 12/01/14 12/01/15  Corky Sox, MD  HYDROcodone-acetaminophen (NORCO/VICODIN) 5-325 MG per tablet Take 1 tablet by mouth every 4 (four) hours as needed. 12/08/14   Janne Napoleon, NP   BP 131/81 mmHg  Pulse 90  Temp(Src) 98.5 F (36.9 C) (Oral)  Resp 16  SpO2 100%  LMP 11/03/2014 Physical Exam  Constitutional: She is oriented to person, place, and time. She appears well-developed and well-nourished. No distress.  HENT:  Mouth/Throat: Oropharynx is clear and  moist. No oropharyngeal exudate.  Right lower third molar tender with extended, hypertrophied gingival tissue with erythema covering a portion of the tooth and small bulge adjacent to the tooth. Gingiva is friable. Positive dental tenderness.   Neck: Normal range of motion. Neck supple.  Pulmonary/Chest: Effort normal.  Lymphadenopathy:    She has no cervical adenopathy.  Neurological: She is alert and oriented to person, place, and time.  Skin: Skin is warm and dry.  Nursing note and vitals reviewed.   ED Course  Procedures (including critical care time) Labs Review Labs Reviewed - No data to display  Imaging Review No results found.   MDM   1. Dental abscess   2. Toothache    Amoxicillin norco 5 mg #15 Must see dentist ASAP    Janne Napoleon, NP 12/08/14 1409  Janne Napoleon, NP 12/08/14 1410

## 2014-12-08 NOTE — Progress Notes (Signed)
Internal Medicine Clinic Attending  I saw and evaluated the patient.  I personally confirmed the key portions of the history and exam documented by Dr. Jones and I reviewed pertinent patient test results.  The assessment, diagnosis, and plan were formulated together and I agree with the documentation in the resident's note.     

## 2014-12-08 NOTE — ED Notes (Signed)
Patient does not speak Katherine Garza waiting for interpreter to arrive. Patient is pointing the the right side of her mouth and implies pain. She holds up the number 3. See providers note.

## 2014-12-17 ENCOUNTER — Ambulatory Visit (INDEPENDENT_AMBULATORY_CARE_PROVIDER_SITE_OTHER): Payer: Medicaid Other | Admitting: Internal Medicine

## 2014-12-17 ENCOUNTER — Encounter: Payer: Self-pay | Admitting: Internal Medicine

## 2014-12-17 VITALS — BP 138/86 | HR 88 | Temp 98.2°F | Ht 63.5 in | Wt 159.3 lb

## 2014-12-17 DIAGNOSIS — K7460 Unspecified cirrhosis of liver: Secondary | ICD-10-CM

## 2014-12-17 DIAGNOSIS — D61818 Other pancytopenia: Secondary | ICD-10-CM

## 2014-12-17 DIAGNOSIS — K219 Gastro-esophageal reflux disease without esophagitis: Secondary | ICD-10-CM

## 2014-12-17 NOTE — Patient Instructions (Addendum)
Please attend your GI and hematology appointments on January 11 and January 25th.  Continue to take your Nexium as prescribed.  General Instructions:   Thank you for bringing your medicines today. This helps [place] keep you safe from mistakes.   Progress Toward Treatment Goals:  No flowsheet data found.  Self Care Goals & Plans:  No flowsheet data found.  No flowsheet data found.   Care Management & Community Referrals:  No flowsheet data found.     Gastroenterology appointment: Dr. Wall[facility] 12-21-2014@9 :30  [address], [place] [address] Referral#: [id number]   Hematology appointment: Dr. Beryle Beams 01/04/15 9:30 AM at [facility] clinic

## 2014-12-17 NOTE — Assessment & Plan Note (Signed)
Patient has burning epigastric pain worst with spicy foods and improving on Nexium (she has been taking it for 2 weeks).   - Continue Nexium (patient has one refill); to be continued if this continues to help - Could investigate for H pylori or consider Carafate (patient's kidney function good) if response to Nexium weakens - Patient has GI follow up with Dr. Penelope Coop on 11/21/15

## 2014-12-17 NOTE — Progress Notes (Signed)
Subjective:     Patient ID: Katherine Garza, female   DOB: 1971/05/19, 44 y.o.   MRN: [id number]  HPI Katherine Garza is a44 year old Swahili-speaking female who presents to the clinic today for follow-up after her initial clinic visit last month. At that time, she was complaining of epigastric burning and she was started on Nexium 20 mg by mouth daily. Today, her main concern is continuing intermittent epigastric burning, though this has improved. The burning is worst with spicy foods, which she cut out of her diet completely. She has not noticed shortness of breath, cough, halitosis, diaphoresis, chest pain or palpitations. She has not noticed a change in the pain on lying down.   On an ED visit one month ago, the patient was complaining of LUQ pain; she was found to have severe splenomegaly and her CT scan of the abdomen at that time showed nodular hepatic margins suggestive of hepatic cirrhosis, severe splenomegaly with dilatation of the portal and splenic vein suggestive of portal hypertension. Also showed multiple small ill-defined hypodensities in hepatic parenchyma, as well an as ill-defined complex abnormality seen in anterior segment of the right hepatic lobe. The patient has an unknown past medical history but does state she has had malaria several times (could not specify the number of times) and has been hospitalized twice for this. Her hepatitis C antibody was reactive on last visit with HCV quant 384,665, (hepatitis B surface antigen negative, hepatitis E not detected, HIV nonreactive), her quantiferon gold indeterminate, her ESR high (38), CRP WNL. She has follow up with Dr. Penelope Coop (GI) on 12/21/14. Today, she states that her LUQ pain has resolved. She has not been experiencing any diarrhea or blood in her stool.  She was also found to have pancytopenia in the ED last month. This may all be a result of her severe splenomegaly and sequestration. She had an MCV of 75 with pancytopenia  on smear. Infection (leishmaniasis, malaria, hepatits?), MDS and lymphoma are also on the differential. The patient has follow up with Dr. Beryle Beams on 01/04/15.   Review of Systems  Constitutional: Negative for chills, activity change and appetite change.  HENT: Negative.   Eyes: Negative.   Respiratory: Negative.   Cardiovascular: Negative.   Gastrointestinal: Negative for vomiting, abdominal pain, diarrhea, constipation and blood in stool.       Burning in chest, worst with spicy foods, improved since last visit.  Genitourinary: Negative.   Musculoskeletal: Negative.   Skin: Negative.   Neurological: Negative.        Objective:   Physical Exam  Constitutional: She is oriented to person, place, and time. She appears well-developed and well-nourished.  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  HENT:  Head: Normocephalic and atraumatic.  Poor dentition  Eyes: EOM are normal. Pupils are equal, round, and reactive to light.  Cardiovascular: Normal rate, regular rhythm and normal heart sounds.   No murmur heard. Pulmonary/Chest: Effort normal and breath sounds normal.  Abdominal: Soft. Bowel sounds are normal. She exhibits distension. There is no tenderness. There is no rebound and no guarding.  Extreme splenomegaly, extending 7 cm below costal margin. Liver not palpable.  Musculoskeletal: She exhibits edema.  Trace pitting edema BLE  Neurological: She is alert and oriented to person, place, and time.  Skin: Skin is warm and dry. She is not diaphoretic.  Psychiatric: She has a normal mood and affect.     In summary, Katherine Garza appears to have symptoms of GERD.  Her Nexium is helping. Please see problem-oriented charting for assessment and plan.

## 2014-12-17 NOTE — Assessment & Plan Note (Signed)
Patient's CT abdomen/pelvis showed hepatic cirrhosis (11/11/14). Her hepatitis C antibody was reactive with HCV quant 608,883, (hepatitis B surface antigen negative, hepatitis E not detected, HIV nonreactive), her quantiferon gold indeterminate, her ESR high (38), CRP WNL. She has been experiencing no diarrhea, blood in her stool, vomiting or RUQ pain.  - Follow up with Dr. Penelope Coop (GI) on 12/21/14

## 2014-12-17 NOTE — Progress Notes (Signed)
Internal Medicine Clinic Attending  Case discussed with Dr. Mallory at the time of the visit.  We reviewed the resident's history and exam and pertinent patient test results.  I agree with the assessment, diagnosis, and plan of care documented in the resident's note. 

## 2014-12-17 NOTE — Assessment & Plan Note (Signed)
Pancytopenia last month (see below). This is likely a result of her severe splenomegaly and sequestration. She had an MCV of 75 with pancytopenia on smear. Infection (leishmaniasis, malaria, hepatits?), MDS and lymphoma are also on the differential.        Ref Range 2wk ago (11/27/14) 64mo ago (11/11/14) 40mo ago (11/11/14)    WBC 4.0 - 10.5 K/uL 1.6 (L)  1.5 (L)    RBC 3.87 - 5.11 MIL/uL 4.45  3.98    Hemoglobin 12.0 - 15.0 g/dL 10.8 (L) 10.9 (L) 9.5 (L)    HCT 36.0 - 46.0 % 33.2 (L) 32.0 (L) 29.9 (L)    MCV 78.0 - 100.0 fL 74.6 (L)  75.1 (L)    MCH 26.0 - 34.0 pg 24.3 (L)  23.9 (L)    MCHC 30.0 - 36.0 g/dL 32.5  31.8    RDW 11.5 - 15.5 % 15.6 (H)  15.0    Platelets 150 - 400 K/uL 34 (L)  38 (L)CM      - Follow up with Dr. Beryle Beams on 01/04/15

## 2014-12-21 ENCOUNTER — Other Ambulatory Visit: Payer: Self-pay | Admitting: Gastroenterology

## 2014-12-21 DIAGNOSIS — K7469 Other cirrhosis of liver: Secondary | ICD-10-CM

## 2014-12-21 DIAGNOSIS — R935 Abnormal findings on diagnostic imaging of other abdominal regions, including retroperitoneum: Secondary | ICD-10-CM

## 2015-01-04 ENCOUNTER — Ambulatory Visit (INDEPENDENT_AMBULATORY_CARE_PROVIDER_SITE_OTHER): Payer: Medicaid Other | Admitting: Oncology

## 2015-01-04 ENCOUNTER — Encounter: Payer: Self-pay | Admitting: Oncology

## 2015-01-04 VITALS — BP 131/75 | HR 72 | Temp 98.0°F | Ht 63.5 in | Wt 164.5 lb

## 2015-01-04 DIAGNOSIS — D509 Iron deficiency anemia, unspecified: Secondary | ICD-10-CM

## 2015-01-04 DIAGNOSIS — R161 Splenomegaly, not elsewhere classified: Secondary | ICD-10-CM

## 2015-01-04 DIAGNOSIS — B1920 Unspecified viral hepatitis C without hepatic coma: Secondary | ICD-10-CM

## 2015-01-04 DIAGNOSIS — B1710 Acute hepatitis C without hepatic coma: Secondary | ICD-10-CM

## 2015-01-04 DIAGNOSIS — Z8613 Personal history of malaria: Secondary | ICD-10-CM

## 2015-01-04 DIAGNOSIS — B182 Chronic viral hepatitis C: Secondary | ICD-10-CM

## 2015-01-04 DIAGNOSIS — K7460 Unspecified cirrhosis of liver: Secondary | ICD-10-CM

## 2015-01-04 DIAGNOSIS — D61818 Other pancytopenia: Secondary | ICD-10-CM

## 2015-01-04 HISTORY — DX: Unspecified viral hepatitis C without hepatic coma: B19.20

## 2015-01-04 LAB — LACTATE DEHYDROGENASE: LDH: 173 U/L (ref 94–250)

## 2015-01-04 LAB — IRON AND TIBC
%SAT: 10 % — ABNORMAL LOW (ref 20–55)
Iron: 51 ug/dL (ref 42–145)
TIBC: 489 ug/dL — AB (ref 250–470)
UIBC: 438 ug/dL — ABNORMAL HIGH (ref 125–400)

## 2015-01-04 LAB — SAVE SMEAR

## 2015-01-04 LAB — HEPATITIS A ANTIBODY, TOTAL: HEP A TOTAL AB: REACTIVE — AB

## 2015-01-04 LAB — FERRITIN: Ferritin: 17 ng/mL (ref 10–291)

## 2015-01-04 NOTE — Patient Instructions (Signed)
To lab today Schedule appointment to see Infectious Disease Dr Linus Salmons as soon as possible No need to see blood specialist again unless requested by the other doctors - I will send them a letter

## 2015-01-04 NOTE — Progress Notes (Signed)
Patient ID: Katherine Garza, female   DOB: [birth year], 44 y.o.   MRN: [id number] New Patient Hematology   Katherine Garza [id number] 09-04-1971 44 y.o. 01/04/2015  CC: Dr. Drucilla Schmidt, Dr. Anson Fret, Dr. Scharlene Gloss   Reason for referral: Pancytopenia in patient with newly diagnosis hepatitis C infection   HPI:  History obtained with the assistance of a certified translator by phone  44 year old woman originally from the [place] who initially presented to [facility] urgent care center on 11/11/2014 complaining of abdominal pain. Pain was progressive over a one year interval. Physical examination remarkable for an enlarged spleen. No lymphadenopathy. Laboratory profile showed pancytopenia with hemoglobin 9.5, hematocrit 29.9, MCV 75, white count 1500, 53% neutrophils, 38% lymphocytes, 7 monocytes, 2 eosinophils platelet count 38,000. A CT scan of the abdomen was done which confirmed marked splenomegaly, a nodular contour of the liver suggesting cirrhosis, some nondescript abnormalities in the liver parenchyma with multiple small ill-defined low densities as well as a more prominent abnormality in the anterior segment of the right hepatic lobe. Evidence for portal hypertension without any portal vein thrombosis. Gallbladder was mildly distended. A nonobstructing left renal stone noted. Benign bilateral renal cysts. She was referred to the general internal medicine clinic for further evaluation. Additional laboratory studies were done on December 18 and revealed that she is positive for hepatitis C with quantitative virus of 426,353, log 5.63. Hepatitis A not checked. Hepatitis B surface antigen negative. HIV negative. QuantiFERON negative. Hepatitis E IgG negative. Repeat CBC essentially unchanged from the December 2 study.   PMH: Past Medical History  Diagnosis Date  . History of malaria     Multiple episodes w/ 2 hospitalizations    No past surgical history on  file.  Allergies: No Known Allergies  Medications:  Current outpatient prescriptions:  .  amoxicillin (AMOXIL) 500 MG capsule, Take 2 capsules (1,000 mg total) by mouth 2 (two) times daily., Disp: 28 capsule, Rfl: 0 .  esomeprazole (NEXIUM) 20 MG capsule, Take 1 capsule (20 mg total) by mouth daily., Disp: 30 capsule, Rfl: 1 .  HYDROcodone-acetaminophen (NORCO/VICODIN) 5-325 MG per tablet, Take 1 tablet by mouth every 4 (four) hours as needed., Disp: 15 tablet, Rfl: 0  Social History: Married. Originally from the [employer]. Speaks only Swahili.   she has never smoked.  she does not drink alcohol or use illicit drugs.  Family History: No family history on file.  Review of Systems: See history of present illness Remaining ROS negative.  Physical Exam: Blood pressure 131/75, pulse 72, temperature 98 F (36.7 C), temperature source Oral, height 5' 3.5" (1.613 m), weight 164 lb 8 oz (74.617 kg), SpO2 100 %. Wt Readings from Last 3 Encounters:  01/04/15 164 lb 8 oz (74.617 kg)  12/17/14 159 lb 4.8 oz (72.258 kg)  11/27/14 156 lb 12.8 oz (71.124 kg)     General appearance: Well-nourished African woman HENNT: Pharynx no erythema, exudate, mass, or ulcer. No thyromegaly or thyroid nodules Lymph nodes: No cervical, supraclavicular, or axillary lymphadenopathy Breasts:  Lungs: Clear to auscultation, resonant to percussion throughout Heart: Regular rhythm, no murmur, no gallop, no rub, no click, no edema Abdomen: Soft, nontender, normal bowel sounds, no obvious fluid wave, no mass, spleen 12 cm below left costal margin. No hepatomegaly. Extremities: No edema, no calf tenderness Musculoskeletal: no joint deformities GU:  Vascular: Carotid pulses 2+, no bruits, Neurologic: Alert, oriented, PERRLA, cranial nerves grossly normal, motor strength 5 over 5, reflexes 1+ symmetric, upper body coordination  normal, gait normal, Skin: No rash or ecchymosis    Lab Results: Lab Results  Component  Value Date   WBC 1.6* 11/27/2014   HGB 10.8* 11/27/2014   HCT 33.2* 11/27/2014   MCV 74.6* 11/27/2014   PLT 34* 11/27/2014     Chemistry      Component Value Date/Time   NA 138 11/27/2014 1059   K 3.9 11/27/2014 1059   CL 103 11/27/2014 1059   CO2 26 11/27/2014 1059   BUN 16 11/27/2014 1059   CREATININE 0.60 11/27/2014 1059   CREATININE 0.60 11/11/2014 1403      Component Value Date/Time   CALCIUM 9.2 11/27/2014 1059   ALKPHOS 55 11/27/2014 1059   AST 35 11/27/2014 1059   ALT 30 11/27/2014 1059   BILITOT 0.5 11/27/2014 1059      Review of peripheral blood film:  Normochromic microcytic red cells. No spherocytes, no schistocytes, no polychromasia, no inclusions, no parasites. Confirm low white count and low platelet count. Mature neutrophils, occasional benign reactive lymphocyte, normal-appearing monocytes. No immature myeloid or lymphoid cells.   Radiological Studies: EXAM: CT ABDOMEN AND PELVIS WITH CONTRAST  TECHNIQUE: Multidetector CT imaging of the abdomen and pelvis was performed using the standard protocol following bolus administration of intravenous contrast.  CONTRAST: 139m OMNIPAQUE IOHEXOL 300 MG/ML SOLN  COMPARISON: None.  FINDINGS: Mild degenerative disc disease is noted at L3-4. Visualized lung bases appear normal.  No gallstones are noted. Gallbladder is mildly distended, but no surrounding inflammation or gallbladder wall thickening is noted. Nodular hepatic margins are noted suggesting hepatic cirrhosis. Portal veins are dilated but patent. Splenic vein is also dilated. Severe splenomegaly is noted. Pancreas appears normal. Adrenal glands appear normal. No hydronephrosis or renal obstruction is noted. Bilateral simple renal cysts are noted. Small nonobstructive calculus is noted in upper pole collecting system of left kidney. Multiple small ill-defined low densities are noted in the liver, as well as ill-defined abnormality seen in  anterior segment of right hepatic lobe. The left kidney is displaced by the splenomegaly. The appendix appears normal. There is no evidence of bowel obstruction. No abnormal fluid collection is noted. Urinary bladder is decompressed. Uterus appears normal. Ovaries appear normal. No significant adenopathy is noted.  IMPRESSION: Nodular hepatic margins are noted suggesting hepatic cirrhosis. Severe splenomegaly is noted with dilatation of the portal and splenic vein suggesting portal hypertension. Multiple small ill-defined hypodensities are noted in hepatic parenchyma, as well as ill-defined complex abnormality seen in anterior segment of right hepatic lobe. MRI is recommended to rule out possible underlying hepatocellular carcinoma.  Small nonobstructive left renal calculus is noted. Bilateral renal cysts are noted. No hydronephrosis or renal obstruction is noted.   Electronically Signed  By: JSabino DickM.D.  On: 11/11/2014 16:16     Impression and Plan: #1. Untreated hepatitis C infection Liver functions are surprisingly normal but x-ray findings suggest cirrhosis with portal hypertension and passive congestion of the spleen. She does have a history of malaria so the splenomegaly may be partially related to this.   #2. Pancytopenia Likely related to #1. A combination of viral suppression of the bone marrow function, decreased thrombopoietin production by the liver, and an element of splenic sequestration in an enlarged spleen.  #3. Microcytic anemia Likely multifactorial. I suspect she has an element of iron deficiency and may also have a thalassemia trait in addition to the anemia associated with active hepatitis C infection.  #4. History of malaria No obvious changes on the peripheral blood  film to suggest active infection  #5. Indistinct, parenchymal abnormalities in her liver. Hepatitis C infection puts her at risk for hepatocellular carcinoma. Radiologist  recommends a MRI scan which has been scheduled for this Wednesday.  Recommendations: I'm going to check some additional baseline laboratory studies including hepatitis a IgM, iron and ferritin, alpha-fetoprotein, hemoglobin electrophoresis. Proceed with MRI of the liver as scheduled. I would also consider ultrasound elastography to assess degree of liver fibrosis. She has a referral to gastroenterology:  Dr. Penelope Coop. I will make a referral to Dr. Scharlene Gloss, infectious disease specialist with interest in hepatitis C. I believe the major challenge in this woman will be getting reimbursement for anti-hepatitis C medication. I do not feel that a bone marrow biopsy will add any additional helpful information that will change her management.        Annia Belt, MD 01/04/2015, 4:21 PM

## 2015-01-04 NOTE — Progress Notes (Signed)
Ms. Heringer came to the Clinic Lab for blood to be drawn.  Used the language line to assist with communication. Swahili.  UG#[id number].   Maryan Rued, PBT  01-04-2015 [phone]

## 2015-01-05 LAB — CBC WITH DIFFERENTIAL/PLATELET
Basophils Absolute: 0 10*3/uL (ref 0.0–0.1)
Basophils Relative: 1 % (ref 0–1)
EOS ABS: 0 10*3/uL (ref 0.0–0.7)
Eosinophils Relative: 2 % (ref 0–5)
HCT: 33.2 % — ABNORMAL LOW (ref 36.0–46.0)
HEMOGLOBIN: 11.1 g/dL — AB (ref 12.0–15.0)
Lymphocytes Relative: 52 % — ABNORMAL HIGH (ref 12–46)
Lymphs Abs: 0.8 10*3/uL (ref 0.7–4.0)
MCH: 23.9 pg — AB (ref 26.0–34.0)
MCHC: 33.4 g/dL (ref 30.0–36.0)
MCV: 71.6 fL — AB (ref 78.0–100.0)
Monocytes Absolute: 0.1 10*3/uL (ref 0.1–1.0)
Monocytes Relative: 7 % (ref 3–12)
Neutro Abs: 0.6 10*3/uL — ABNORMAL LOW (ref 1.7–7.7)
Neutrophils Relative %: 38 % — ABNORMAL LOW (ref 43–77)
Platelets: 35 10*3/uL — ABNORMAL LOW (ref 150–400)
RBC: 4.64 MIL/uL (ref 3.87–5.11)
RDW: 15.7 % — ABNORMAL HIGH (ref 11.5–15.5)
WBC: 1.5 10*3/uL — AB (ref 4.0–10.5)

## 2015-01-05 LAB — RETICULOCYTES
ABS Retic: 32.5 10*3/uL (ref 19.0–186.0)
RBC.: 4.64 MIL/uL (ref 3.87–5.11)
Retic Ct Pct: 0.7 % (ref 0.4–2.3)

## 2015-01-05 LAB — AFP TUMOR MARKER: AFP-Tumor Marker: 5.7 ng/mL (ref <6.1)

## 2015-01-05 LAB — PATHOLOGIST SMEAR REVIEW

## 2015-01-06 ENCOUNTER — Other Ambulatory Visit: Payer: Medicaid Other

## 2015-01-06 LAB — HEMOGLOBINOPATHY EVALUATION
HEMOGLOBIN OTHER: 0 %
HGB A2 QUANT: 2.9 % (ref 2.2–3.2)
Hgb A: 59.2 % — ABNORMAL LOW (ref 96.8–97.8)
Hgb F Quant: 0 % (ref 0.0–2.0)
Hgb S Quant: 37.9 % — ABNORMAL HIGH

## 2015-01-14 ENCOUNTER — Other Ambulatory Visit: Payer: Medicaid Other

## 2015-01-14 DIAGNOSIS — B182 Chronic viral hepatitis C: Secondary | ICD-10-CM

## 2015-01-14 LAB — HEPATITIS B SURFACE ANTIBODY,QUALITATIVE: Hep B S Ab: NEGATIVE

## 2015-01-14 LAB — HEPATITIS B CORE ANTIBODY, TOTAL: HEP B C TOTAL AB: NONREACTIVE

## 2015-01-14 LAB — PROTIME-INR
INR: 1.31 (ref <1.50)
PROTHROMBIN TIME: 16.3 s — AB (ref 11.6–15.2)

## 2015-01-15 LAB — ANA: Anti Nuclear Antibody(ANA): POSITIVE — AB

## 2015-01-15 LAB — ANTI-NUCLEAR AB-TITER (ANA TITER): ANA Titer 1: NEGATIVE

## 2015-01-19 LAB — HEPATITIS C GENOTYPE: HCV Genotype: 4

## 2015-01-20 ENCOUNTER — Other Ambulatory Visit: Payer: Medicaid Other

## 2015-01-29 ENCOUNTER — Ambulatory Visit
Admission: RE | Admit: 2015-01-29 | Discharge: 2015-01-29 | Disposition: A | Discharge status: Home / Self Care | Payer: Medicaid Other | Source: Ambulatory Visit | Attending: Gastroenterology | Admitting: Gastroenterology

## 2015-01-29 DIAGNOSIS — K7469 Other cirrhosis of liver: Secondary | ICD-10-CM

## 2015-01-29 DIAGNOSIS — R935 Abnormal findings on diagnostic imaging of other abdominal regions, including retroperitoneum: Secondary | ICD-10-CM

## 2015-01-29 MED ORDER — GADOXETATE DISODIUM 0.25 MMOL/ML IV SOLN
7.0000 mL | Freq: Once | INTRAVENOUS | Status: AC | PRN
  Administered 2015-01-29: 7 mL via INTRAVENOUS

## 2015-02-24 ENCOUNTER — Ambulatory Visit (INDEPENDENT_AMBULATORY_CARE_PROVIDER_SITE_OTHER): Payer: Medicaid Other | Admitting: Internal Medicine

## 2015-02-24 ENCOUNTER — Encounter: Payer: Self-pay | Admitting: Internal Medicine

## 2015-02-24 VITALS — BP 123/79 | HR 96 | Temp 98.2°F | Ht 64.0 in | Wt 164.0 lb

## 2015-02-24 DIAGNOSIS — B182 Chronic viral hepatitis C: Secondary | ICD-10-CM

## 2015-02-24 DIAGNOSIS — Z23 Encounter for immunization: Secondary | ICD-10-CM

## 2015-02-24 DIAGNOSIS — K7460 Unspecified cirrhosis of liver: Secondary | ICD-10-CM

## 2015-02-24 MED ORDER — LEDIPASVIR-SOFOSBUVIR 90-400 MG PO TABS: 1.0000 | ORAL_TABLET | Freq: Every day | ORAL | Status: DC

## 2015-02-24 NOTE — Progress Notes (Signed)
+Katherine Garza is a 44 y.o. female who presents for initial evaluation and management of a positive Hepatitis C antibody test.  Patient tested positive several months ago. Hepatitis C risk factors present are: none identified, she is from the [place], in the [place] 4 months. Patient denies IV drug abuse, multiple sexual partners, renal dialysis, sexual contact with person with liver disease, tattoos. Patient has had other studies performed. Results: hepatitis C RNA by PCR, result: positive. Patient has not had prior treatment for Hepatitis C. Patient does have a past history of liver disease. Patient does not have a family history of liver disease.   HPI: She does not recall any transmission risk.  Does not drink alcohol.  Has not recently had her menstrual period and is sexually active.  Takes Nexium.    Patient does have documented immunity to Hepatitis A. Patient does not have documented immunity to Hepatitis B.     Review of Systems A comprehensive review of systems was negative.   Past Medical History  Diagnosis Date  . History of malaria     Multiple episodes w/ 2 hospitalizations  . Hepatitis C virus infection 01/04/2015    Prior to Admission medications   Medication Sig Start Date End Date Taking? Authorizing Provider  esomeprazole (NEXIUM) 20 MG capsule Take 1 capsule (20 mg total) by mouth daily. 12/01/14 12/01/15 Yes Corky Sox, MD    No Known Allergies  History  Substance Use Topics  . Smoking status: Never Smoker   . Smokeless tobacco: Never Used  . Alcohol Use: No    No family history on file.    Objective:   Filed Vitals:   02/24/15 1338  BP: 123/79  Pulse: 96  Temp: 98.2 F (36.8 C)   in no apparent distress and alert HEENT: anicteric Cor RRR and No murmurs clear Bowel sounds are normal, liver is not enlarged, spleen is not enlarged peripheral pulses normal, no pedal edema, no clubbing or cyanosis negative for - jaundice, spider hemangioma,  telangiectasia, palmar erythema, ecchymosis and atrophy  Laboratory Genotype:  Lab Results  Component Value Date   HCVGENOTYPE 4 01/14/2015   HCV viral load:  Lab Results  Component Value Date   HCVQUANT 774128* 11/27/2014   Lab Results  Component Value Date   WBC 1.5* 01/04/2015   HGB 11.1* 01/04/2015   HCT 33.2* 01/04/2015   MCV 71.6* 01/04/2015   PLT 35* 01/04/2015    Lab Results  Component Value Date   CREATININE 0.60 11/27/2014   BUN 16 11/27/2014   NA 138 11/27/2014   K 3.9 11/27/2014   CL 103 11/27/2014   CO2 26 11/27/2014    Lab Results  Component Value Date   ALT 30 11/27/2014   AST 35 11/27/2014   ALKPHOS 55 11/27/2014   BILITOT 0.5 11/27/2014   INR 1.31 01/14/2015      Assessment: Chronic Hepatitis C genotype 4  Plan: 1) Patient counseled extensively on limiting acetaminophen to no more than 2 grams daily, avoidance of alcohol. 2) Transmission discussed with patient including sexual transmission, sharing razors and toothbrush.   3) Will need referral to gastroenterology if concern for cirrhosis 4) Will need referral for substance abuse counseling: No. 5) Will prescribe Harvoni for 12 weeks  6) Hepatitis A vaccine No. 7) Hepatitis B vaccine Yes.   8) Pneumovax vaccine  9) will follow up in 6 weeks 10) she is on Nexium and feels she can stop during Harvoni treatment

## 2015-02-24 NOTE — Addendum Note (Signed)
Addended by: Myrtis Hopping A on: 02/24/2015 02:50 PM   Modules accepted: Orders

## 2015-02-24 NOTE — Patient Instructions (Addendum)
Date 02/24/2015  Dear Ms Melene Muller, As discussed in the [facility], your hepatitis C therapy will include the following medications:          Harvoni 90mg /400mg  tablet:           Take 1 tablet by mouth once daily   Please note that ALL MEDICATIONS WILL START ON THE SAME DATE for a total of 12 weeks. ---------------------------------------------------------------- Your HCV Treatment Start Date: TBA   Your HCV genotype:  4    Liver Fibrosis: cirrhosis   ---------------------------------------------------------------- YOUR PHARMACY CONTACT:   [place] [facility] [place] Phone: [phone] Hours: Monday to Friday 7:30 am to 6:00 pm   Please always contact your pharmacy at least 3-4 business days before you run out of medications to ensure your next month's medication is ready or 1 week prior to running out if you receive it by mail.  Remember, each prescription is for 28 days. ---------------------------------------------------------------- GENERAL NOTES REGARDING YOUR HEPATITIS C MEDICATION:  SOFOSBUVIR/LEDIPASVIR (HARVONI): - Harvoni tablet is taken daily with OR without food. - The tablets are orange. - The tablets should be stored at room temperature.  - Acid reducing agents such as H2 blockers (ie. Pepcid (famotidine), Zantac (ranitidine), Tagamet (cimetidine), Axid (nizatidine) and proton pump inhibitors (ie. Prilosec (omeprazole), Protonix (pantoprazole), Nexium (esomeprazole), or Aciphex (rabeprazole)) can decrease effectiveness of Harvoni. Do not take until you have discussed with a health care provider.    -Antacids that contain magnesium and/or aluminum hydroxide (ie. Milk of Magensia, Rolaids, Gaviscon, Maalox, Mylanta, an dArthritis Pain Formula)can reduce absorption of Harvoni, so take them at least 4 hours before or after Harvoni.  -Calcium carbonate (calcium supplements or antacids such as Tums,  Caltrate, Os-Cal)needs to be taken at least 4 hours hours before or after Harvoni.  -St. John's wort or any products that contain St. John's wort like some herbal supplements  Please inform the office prior to starting any of these medications.  - The common side effects with Harvoni:      1. Fatigue      2. Headache      3. Nausea      4. Diarrhea      5. Insomnia   Support Path is a suite of resources designed to help patients start with HARVONI and move toward treatment completion [facility] helps patients access therapy and get off to an efficient start  Benefits investigation and prior authorization support Co-pay and other financial assistance A specialty pharmacy finder CO-PAY COUPON The [facility] co-pay coupon may help eligible patients lower their out-of-pocket costs. With a co-pay coupon, most eligible patients may pay no more than $5 per co-pay (restrictions apply) www.harvoni.com call 714-887-7137 Not valid for patients enrolled in government healthcare prescription drug programs, such as Medicare Part D and Medicaid. Patients in the coverage gap known as the "donut hole" also are not eligible The HARVONI co-pay coupon program will cover the out-of-pocket costs for HARVONI prescriptions up to a maximum of 25% of the catalog price of a 12-week regimen of HARVONI  Please note that this only lists the most common side effects and is NOT a comprehensive list of the potential side effects of these medications. For more information, please review the drug information sheets that come with your medication package from the pharmacy.  ---------------------------------------------------------------- GENERAL HELPFUL HINTS ON HCV THERAPY: 1. No alcohol. 2. Protect against sun-sensitivity/sunburns (wear sunglasses, hat, long sleeves, pants and  sunscreen). 3. Stay well-hydrated/well-moisturized. 4. Notify the [facility] of any changes in your other  over-the-counter/herbal or prescription medications. 5. If you miss a dose of your medication, take the missed dose as soon as you remember. Return to your regular time/dose schedule the next day.  6.  Do not stop taking your medications without first talking with your healthcare provider. 7.  You may take Tylenol (acetaminophen), as long as the dose is less than 2000 mg (OR no more than 4 tablets of the Tylenol Extra Strengths 500mg  tablet) in 24 hours. 8.  You will need to obtain routine labs and/or office visits at [facility] at weeks 2, 4, 8,  and 12 as well as 12 and 24 weeks after completion of treatment.   Scharlene Gloss, [place] for [address] [place], [place]  [phone]

## 2015-02-24 NOTE — Addendum Note (Signed)
Addended by: Thayer Headings on: 02/24/2015 02:30 PM   Modules accepted: Level of Service

## 2015-02-25 ENCOUNTER — Other Ambulatory Visit (INDEPENDENT_AMBULATORY_CARE_PROVIDER_SITE_OTHER): Payer: Medicaid Other

## 2015-02-25 DIAGNOSIS — N926 Irregular menstruation, unspecified: Secondary | ICD-10-CM

## 2015-02-25 LAB — POCT URINE PREGNANCY: PREG TEST UR: NEGATIVE

## 2015-04-05 ENCOUNTER — Other Ambulatory Visit: Payer: Self-pay | Admitting: Gastroenterology

## 2015-04-05 DIAGNOSIS — K7469 Other cirrhosis of liver: Secondary | ICD-10-CM

## 2015-04-06 ENCOUNTER — Encounter: Payer: Self-pay | Admitting: Internal Medicine

## 2015-04-06 ENCOUNTER — Ambulatory Visit (HOSPITAL_COMMUNITY)
Admission: RE | Admit: 2015-04-06 | Discharge: 2015-04-06 | Disposition: A | Discharge status: Home / Self Care | Payer: Medicaid Other | Source: Ambulatory Visit | Attending: Internal Medicine | Admitting: Internal Medicine

## 2015-04-06 ENCOUNTER — Ambulatory Visit (INDEPENDENT_AMBULATORY_CARE_PROVIDER_SITE_OTHER): Payer: Medicaid Other | Admitting: Internal Medicine

## 2015-04-06 VITALS — BP 129/74 | HR 76 | Temp 98.6°F | Ht 64.0 in | Wt 175.9 lb

## 2015-04-06 DIAGNOSIS — R2232 Localized swelling, mass and lump, left upper limb: Secondary | ICD-10-CM | POA: Insufficient documentation

## 2015-04-06 DIAGNOSIS — Z0000 Encounter for general adult medical examination without abnormal findings: Secondary | ICD-10-CM | POA: Insufficient documentation

## 2015-04-06 DIAGNOSIS — B1920 Unspecified viral hepatitis C without hepatic coma: Secondary | ICD-10-CM

## 2015-04-06 DIAGNOSIS — H6123 Impacted cerumen, bilateral: Secondary | ICD-10-CM | POA: Diagnosis present

## 2015-04-06 DIAGNOSIS — M25532 Pain in left wrist: Secondary | ICD-10-CM | POA: Insufficient documentation

## 2015-04-06 DIAGNOSIS — H6120 Impacted cerumen, unspecified ear: Secondary | ICD-10-CM | POA: Insufficient documentation

## 2015-04-06 MED ORDER — CIPROFLOXACIN-DEXAMETHASONE 0.3-0.1 % OT SUSP: 4.0000 [drp] | Freq: Two times a day (BID) | OTIC | Status: DC

## 2015-04-06 NOTE — Assessment & Plan Note (Signed)
Checked Snellen vision is 20/20 left eye and 20/30 right eye. Patient is able to work her job at [employer] with this vision. If vision declines consider sending to eye MD

## 2015-04-06 NOTE — Patient Instructions (Addendum)
Follow up in 3 months, sooner if needed  You can try Debrox drops for your ears  Use antibiotic ear drops 4 x per day right ear We will take an Xray of your left wrist  You are able to work.  Your vision and your ears are okay for your job. (Please see the note)  Cerumen Impaction A cerumen impaction is when the wax in your ear forms a plug. This plug usually causes reduced hearing. Sometimes it also causes an earache or dizziness. Removing a cerumen impaction can be difficult and painful. The wax sticks to the ear canal. The canal is sensitive and bleeds easily. If you try to remove a heavy wax buildup with a cotton tipped swab, you may push it in further. Irrigation with water, suction, and small ear curettes may be used to clear out the wax. If the impaction is fixed to the skin in the ear canal, ear drops may be needed for a few days to loosen the wax. People who build up a lot of wax frequently can use ear wax removal products available in your local drugstore. SEEK MEDICAL CARE IF:  You develop an earache, increased hearing loss, or marked dizziness. Document Released: 01/04/2005 Document Revised: 02/19/2012 Document Reviewed: 02/24/2010 [facility] Patient Information 2015 [place], [place]. This information is not intended to replace advice given to you by your health care provider. Make sure you discuss any questions you have with your health care provider.

## 2015-04-06 NOTE — Assessment & Plan Note (Signed)
Taking Harvoni and following with ID

## 2015-04-06 NOTE — Progress Notes (Signed)
   Subjective:    Patient ID: Katherine Garza, female    DOB: 1971-09-15, 44 y.o.   MRN: [id number]  HPI Comments: 44 y.o african speaking women. 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 BP today 124/74  1. Eyes-She denies problems with vision. Snellen test performed and left eye with 20/20 vision and right eye with 20/30 vision  2. Ears-She denies problems with hearing 3. Left wrist with ~1 cm nodule which has been present for >1 year or longer. She denies pain currently and when she does have pain has 4/10 pain with ROM.  Her wrist pain is better. She has not tried any meds for this 4. H/o HCV with cirrhosis following with ID on Harvoni     Review of Systems  HENT: Negative for hearing loss.   Eyes: Negative for visual disturbance.  Respiratory: Negative for shortness of breath.   Cardiovascular: Negative for chest pain.       Objective:   Physical Exam  Constitutional: She is oriented to person, place, and time. Vital signs are normal. She appears well-developed and well-nourished. She is cooperative. No distress.  HENT:  Head: Normocephalic and atraumatic.  Right Ear: Hearing, tympanic membrane and external ear normal.  Left Ear: Hearing and tympanic membrane normal.  Mouth/Throat: Abnormal dentition. No oropharyngeal exudate.  B/l cerumen impaction   Eyes: Conjunctivae are normal. Pupils are equal, round, and reactive to light. Right eye exhibits no discharge. Left eye exhibits no discharge. No scleral icterus.  Cardiovascular: Normal rate, regular rhythm and normal heart sounds.   No murmur heard. Pulmonary/Chest: Effort normal and breath sounds normal. No respiratory distress. She has no wheezes.  Musculoskeletal:       Left wrist: She exhibits normal range of motion and no tenderness.        Arms: Neurological: She is alert and oriented to person, place, and time. She has normal strength. No cranial nerve deficit or sensory deficit. Gait normal.  Snellen with left eye 20/20 right eye 20/30  Skin: Skin is warm and dry. No rash noted. She is not diaphoretic.  Psychiatric: She has a normal mood and affect. Her speech is normal and behavior is normal. Judgment and thought content normal. Cognition and memory are normal.  Nursing note and vitals reviewed.         Assessment & Plan:  F/u in 3 months

## 2015-04-06 NOTE — Addendum Note (Signed)
Addended by: Cresenciano Genre on: 04/06/2015 11:57 AM   Modules accepted: Orders

## 2015-04-06 NOTE — Addendum Note (Signed)
Addended by: Cresenciano Genre on: 04/06/2015 11:53 AM   Modules accepted: Orders, Level of Service

## 2015-04-06 NOTE — Assessment & Plan Note (Signed)
Lesion could be bone vs ganglion cyst  Will do Xray left wrist to rule of bone lesion if negative consider further w/u for ganglion cyst

## 2015-04-06 NOTE — Assessment & Plan Note (Addendum)
B/l ear lavage after lavage ear bleeding min. With inflammation will try ear drops  Debrox OTC drops can try

## 2015-04-07 NOTE — Progress Notes (Signed)
INTERNAL MEDICINE TEACHING ATTENDING ADDENDUM - Sheyla Zaffino, MD: I reviewed and discussed at the time of visit with the resident Dr. McLean, the patient's medical history, physical examination, diagnosis and results of pertinent tests and treatment and I agree with the patient's care as documented.  

## 2015-04-09 ENCOUNTER — Other Ambulatory Visit: Payer: Medicaid Other

## 2015-04-12 ENCOUNTER — Encounter: Payer: Self-pay | Admitting: *Deleted

## 2015-04-15 ENCOUNTER — Ambulatory Visit (INDEPENDENT_AMBULATORY_CARE_PROVIDER_SITE_OTHER): Payer: Medicaid Other | Admitting: Internal Medicine

## 2015-04-15 ENCOUNTER — Encounter: Payer: Self-pay | Admitting: Internal Medicine

## 2015-04-15 VITALS — BP 136/88 | HR 80 | Temp 98.7°F | Wt 174.0 lb

## 2015-04-15 DIAGNOSIS — Z23 Encounter for immunization: Secondary | ICD-10-CM

## 2015-04-15 DIAGNOSIS — K7460 Unspecified cirrhosis of liver: Secondary | ICD-10-CM | POA: Diagnosis not present

## 2015-04-15 DIAGNOSIS — B182 Chronic viral hepatitis C: Secondary | ICD-10-CM | POA: Diagnosis not present

## 2015-04-15 LAB — COMPLETE METABOLIC PANEL WITH GFR
ALBUMIN: 4.3 g/dL (ref 3.5–5.2)
ALT: 9 U/L (ref 0–35)
AST: 16 U/L (ref 0–37)
Alkaline Phosphatase: 69 U/L (ref 39–117)
BUN: 11 mg/dL (ref 6–23)
CO2: 26 mEq/L (ref 19–32)
CREATININE: 0.78 mg/dL (ref 0.50–1.10)
Calcium: 9.4 mg/dL (ref 8.4–10.5)
Chloride: 101 mEq/L (ref 96–112)
GFR, Est African American: >89 mL/min
Glucose, Bld: 90 mg/dL (ref 70–99)
Potassium: 4.1 mEq/L (ref 3.5–5.3)
Sodium: 137 mEq/L (ref 135–145)
TOTAL PROTEIN: 8.5 g/dL — AB (ref 6.0–8.3)
Total Bilirubin: 0.8 mg/dL (ref 0.2–1.2)

## 2015-04-15 NOTE — Assessment & Plan Note (Addendum)
Managed by [facility].

## 2015-04-15 NOTE — Progress Notes (Signed)
   Subjective:    Patient ID: Katherine Garza, female    DOB: 03-Sep-1971, 44 y.o.   MRN: [id number]  HPI She is here for follow up of HCV.  She has genotype 4, viral load of 426,353 and cirrhosis noted on MRI. She started Harvoni just over 4 weeks ago and taking daily, no missed doses and is on her second bottle.  Has held Nexium.  No issues with GERD.  Child Pugh A.  Did have MRI of liver due to nodules but not cw hepatoma.  AFP done and normal at 5.7.  Noted splenomegaly on CT, has low platelets.  No symptoms of encephalopathy.     Review of Systems  Constitutional: Negative for fatigue.  Gastrointestinal: Negative for nausea and diarrhea.  Skin: Negative for rash.  Neurological: Positive for headaches.       Mild and relieved without therapy       Objective:   Physical Exam  Constitutional: She appears well-developed and well-nourished. No distress.  Eyes: No scleral icterus.  Cardiovascular: Normal rate, regular rhythm and normal heart sounds.   No murmur heard. Pulmonary/Chest: Effort normal and breath sounds normal. No respiratory distress. She has no wheezes.  Skin: No rash noted.          Assessment & Plan:

## 2015-04-15 NOTE — Assessment & Plan Note (Signed)
Doing well and no issues with medication.  Labs today and rtc in 8-9 weeks for end of treatment labs.

## 2015-04-15 NOTE — Progress Notes (Signed)
Patient already has follow up scheduled with [facility], Dr. Penelope Coop.  She is scheduled for ultrasound on 5/9 at [place], upper GI at [place] on 5/10. Landis Gandy, RN

## 2015-04-16 LAB — CBC WITH DIFFERENTIAL/PLATELET
BASOS PCT: 0 % (ref 0–1)
Basophils Absolute: 0 10*3/uL (ref 0.0–0.1)
EOS ABS: 0 10*3/uL (ref 0.0–0.7)
EOS PCT: 1 % (ref 0–5)
HCT: 34.8 % — ABNORMAL LOW (ref 36.0–46.0)
HEMOGLOBIN: 11.5 g/dL — AB (ref 12.0–15.0)
Lymphocytes Relative: 42 % (ref 12–46)
Lymphs Abs: 0.8 10*3/uL (ref 0.7–4.0)
MCH: 24.5 pg — ABNORMAL LOW (ref 26.0–34.0)
MCHC: 33 g/dL (ref 30.0–36.0)
MCV: 74.2 fL — AB (ref 78.0–100.0)
MONOS PCT: 6 % (ref 3–12)
Monocytes Absolute: 0.1 10*3/uL (ref 0.1–1.0)
Neutro Abs: 0.9 10*3/uL — ABNORMAL LOW (ref 1.7–7.7)
Neutrophils Relative %: 51 % (ref 43–77)
PLATELETS: 33 10*3/uL — AB (ref 150–400)
RBC: 4.69 MIL/uL (ref 3.87–5.11)
RDW: 16.6 % — ABNORMAL HIGH (ref 11.5–15.5)
WBC: 1.8 10*3/uL — ABNORMAL LOW (ref 4.0–10.5)

## 2015-04-16 LAB — HEPATITIS C RNA QUANTITATIVE: HCV QUANT: NOT DETECTED [IU]/mL (ref <15)

## 2015-04-19 ENCOUNTER — Ambulatory Visit
Admission: RE | Admit: 2015-04-19 | Discharge: 2015-04-19 | Disposition: A | Discharge status: Home / Self Care | Payer: Medicaid Other | Source: Ambulatory Visit | Attending: Gastroenterology | Admitting: Gastroenterology

## 2015-04-19 DIAGNOSIS — K7469 Other cirrhosis of liver: Secondary | ICD-10-CM

## 2015-06-15 ENCOUNTER — Ambulatory Visit (INDEPENDENT_AMBULATORY_CARE_PROVIDER_SITE_OTHER): Payer: Medicaid Other | Admitting: Internal Medicine

## 2015-06-15 VITALS — BP 134/84 | HR 87 | Temp 97.4°F | Wt 179.0 lb

## 2015-06-15 DIAGNOSIS — B1920 Unspecified viral hepatitis C without hepatic coma: Secondary | ICD-10-CM | POA: Diagnosis present

## 2015-06-15 DIAGNOSIS — Z23 Encounter for immunization: Secondary | ICD-10-CM

## 2015-06-15 DIAGNOSIS — K7460 Unspecified cirrhosis of liver: Secondary | ICD-10-CM | POA: Diagnosis not present

## 2015-06-15 NOTE — Assessment & Plan Note (Signed)
Followed by [facility].

## 2015-06-15 NOTE — Addendum Note (Signed)
Addended by: Myrtis Hopping A on: 06/15/2015 11:37 AM   Modules accepted: Orders

## 2015-06-15 NOTE — Progress Notes (Signed)
   Subjective:    Patient ID: Katherine Garza, female    DOB: 03/30/1971, 44 y.o.   MRN: [id number]  HPI She is here for follow up of HCV.  She has genotype 4, viral load of 426,353 and cirrhosis noted on MRI. She completed 12 weeks of Harvoni.  Held Nexium.  No issues with GERD.  Child Pugh A.  Did have MRI of liver due to nodules but not cw hepatoma.  AFP done and normal at 5.7. Recent ultrasound and no new concerns.  Followed by Dr. Penelope Coop of [facility].  Noted splenomegaly on CT, has low platelets.    Review of Systems  Constitutional: Negative for fatigue.  Gastrointestinal: Negative for nausea and diarrhea.  Skin: Negative for rash.  Neurological: Negative for headaches.       Objective:   Physical Exam  Constitutional: She appears well-developed and well-nourished. No distress.  Eyes: No scleral icterus.  Cardiovascular: Normal rate, regular rhythm and normal heart sounds.   No murmur heard. Pulmonary/Chest: Effort normal and breath sounds normal. No respiratory distress. She has no wheezes.  Abdominal: Soft. Bowel sounds are normal. She exhibits distension.  Skin: No rash noted.          Assessment & Plan:

## 2015-06-15 NOTE — Assessment & Plan Note (Signed)
Will check end of treatment vl and then SVR12 in about 4 months (close to [facility]).

## 2015-06-16 LAB — HEPATITIS C RNA QUANTITATIVE: HCV Quantitative: NOT DETECTED IU/mL (ref <15)

## 2015-10-21 ENCOUNTER — Ambulatory Visit (INDEPENDENT_AMBULATORY_CARE_PROVIDER_SITE_OTHER): Payer: BLUE CROSS/BLUE SHIELD | Admitting: Internal Medicine

## 2015-10-21 ENCOUNTER — Encounter: Payer: Self-pay | Admitting: Internal Medicine

## 2015-10-21 VITALS — BP 129/86 | HR 73 | Temp 97.6°F | Ht 62.0 in | Wt 194.0 lb

## 2015-10-21 DIAGNOSIS — Z23 Encounter for immunization: Secondary | ICD-10-CM

## 2015-10-21 DIAGNOSIS — B182 Chronic viral hepatitis C: Secondary | ICD-10-CM

## 2015-10-21 DIAGNOSIS — K7460 Unspecified cirrhosis of liver: Secondary | ICD-10-CM | POA: Diagnosis not present

## 2015-10-22 LAB — HEPATITIS C RNA QUANTITATIVE: HCV Quantitative: NOT DETECTED IU/mL (ref <15)

## 2015-10-22 NOTE — Progress Notes (Signed)
   Subjective:    Patient ID: Katherine Garza, female    DOB: Feb 02, 1971, 44 y.o.   MRN: [id number]  HPI She is here for follow up of HCV.  She has genotype 4, viral load of 426,353 and cirrhosis noted on MRI. She completed 12 weeks of Harvoni.  Child Pugh A.  Did have MRI of liver due to nodules but not cw hepatoma.  AFP done and normal at 5.7. Followed by Dr. Penelope Coop of [facility].  Noted splenomegaly on CT, has low platelets. Now here for SVR12 at about 5 months post treatment.  No complaints.  On nadolol for her cirrhosis.    Review of Systems  Constitutional: Negative for fatigue.  Gastrointestinal: Negative for nausea and diarrhea.  Skin: Negative for rash.  Neurological: Negative for headaches.       Objective:   Physical Exam  Constitutional: She appears well-developed and well-nourished. No distress.  Eyes: No scleral icterus.  Cardiovascular: Normal rate, regular rhythm and normal heart sounds.   No murmur heard. Pulmonary/Chest: Effort normal and breath sounds normal. No respiratory distress. She has no wheezes.  Abdominal: Soft. Bowel sounds are normal. She exhibits distension.  Skin: No rash noted.          Assessment & Plan:

## 2015-10-22 NOTE — Assessment & Plan Note (Signed)
Will check SVR12 (5 months) today and if negative, considered cured.  No further follow up indicated.

## 2015-10-22 NOTE — Assessment & Plan Note (Signed)
Followed by GI.  Recently started nadolol.  Will continue to follow with them.

## 2015-10-25 ENCOUNTER — Telehealth: Payer: Self-pay | Admitting: *Deleted

## 2015-10-25 NOTE — Telephone Encounter (Signed)
Patient notified via interpreter. Katherine Garza

## 2015-10-25 NOTE — Telephone Encounter (Signed)
-----   Message from Thayer Headings, MD sent at 10/23/2015  9:23 AM EST ----- Please let her know that her HCV is still negative and she is now considered cured, as discussed.  She does not need to follow up. thanks

## 2016-02-16 ENCOUNTER — Other Ambulatory Visit: Payer: Self-pay | Admitting: Gastroenterology

## 2016-02-16 DIAGNOSIS — K7460 Unspecified cirrhosis of liver: Secondary | ICD-10-CM

## 2016-02-23 ENCOUNTER — Ambulatory Visit
Admission: RE | Admit: 2016-02-23 | Discharge: 2016-02-23 | Disposition: A | Discharge status: Home / Self Care | Payer: BLUE CROSS/BLUE SHIELD | Source: Ambulatory Visit | Attending: Gastroenterology | Admitting: Gastroenterology

## 2016-02-23 DIAGNOSIS — K7460 Unspecified cirrhosis of liver: Secondary | ICD-10-CM

## 2016-05-22 ENCOUNTER — Encounter: Payer: Self-pay | Admitting: *Deleted

## 2016-09-06 ENCOUNTER — Other Ambulatory Visit: Payer: Self-pay | Admitting: Gastroenterology

## 2016-09-06 DIAGNOSIS — K7460 Unspecified cirrhosis of liver: Secondary | ICD-10-CM

## 2016-09-20 ENCOUNTER — Ambulatory Visit
Admission: RE | Admit: 2016-09-20 | Discharge: 2016-09-20 | Disposition: A | Discharge status: Home / Self Care | Payer: BLUE CROSS/BLUE SHIELD | Source: Ambulatory Visit | Attending: Gastroenterology | Admitting: Gastroenterology

## 2016-09-20 DIAGNOSIS — K7460 Unspecified cirrhosis of liver: Secondary | ICD-10-CM

## 2017-01-05 ENCOUNTER — Ambulatory Visit (HOSPITAL_COMMUNITY)
Admission: RE | Admit: 2017-01-05 | Discharge: 2017-01-05 | Disposition: A | Discharge status: Home / Self Care | Payer: BLUE CROSS/BLUE SHIELD | Source: Ambulatory Visit | Attending: Internal Medicine | Admitting: Internal Medicine

## 2017-01-05 ENCOUNTER — Ambulatory Visit (INDEPENDENT_AMBULATORY_CARE_PROVIDER_SITE_OTHER): Payer: BLUE CROSS/BLUE SHIELD | Admitting: Internal Medicine

## 2017-01-05 ENCOUNTER — Telehealth: Payer: Self-pay | Admitting: *Deleted

## 2017-01-05 ENCOUNTER — Encounter: Payer: Self-pay | Admitting: Internal Medicine

## 2017-01-05 VITALS — BP 134/76 | HR 80 | Temp 97.3°F | Ht 62.0 in | Wt 228.6 lb

## 2017-01-05 DIAGNOSIS — R600 Localized edema: Secondary | ICD-10-CM | POA: Diagnosis not present

## 2017-01-05 DIAGNOSIS — M25461 Effusion, right knee: Secondary | ICD-10-CM | POA: Diagnosis not present

## 2017-01-05 DIAGNOSIS — M79604 Pain in right leg: Secondary | ICD-10-CM

## 2017-01-05 DIAGNOSIS — M7989 Other specified soft tissue disorders: Secondary | ICD-10-CM | POA: Insufficient documentation

## 2017-01-05 DIAGNOSIS — M79605 Pain in left leg: Secondary | ICD-10-CM

## 2017-01-05 DIAGNOSIS — Z862 Personal history of diseases of the blood and blood-forming organs and certain disorders involving the immune mechanism: Secondary | ICD-10-CM

## 2017-01-05 DIAGNOSIS — K766 Portal hypertension: Secondary | ICD-10-CM

## 2017-01-05 DIAGNOSIS — K7460 Unspecified cirrhosis of liver: Secondary | ICD-10-CM

## 2017-01-05 DIAGNOSIS — Z8619 Personal history of other infectious and parasitic diseases: Secondary | ICD-10-CM

## 2017-01-05 DIAGNOSIS — M25561 Pain in right knee: Secondary | ICD-10-CM | POA: Diagnosis not present

## 2017-01-05 DIAGNOSIS — Z79899 Other long term (current) drug therapy: Secondary | ICD-10-CM

## 2017-01-05 LAB — CBC WITH DIFFERENTIAL/PLATELET
BASOS PCT: 0 %
Basophils Absolute: 0 10*3/uL (ref 0.0–0.1)
EOS ABS: 0 10*3/uL (ref 0.0–0.7)
EOS PCT: 1 %
HCT: 31.1 % — ABNORMAL LOW (ref 36.0–46.0)
Hemoglobin: 10.1 g/dL — ABNORMAL LOW (ref 12.0–15.0)
Lymphocytes Relative: 41 %
Lymphs Abs: 0.7 10*3/uL (ref 0.7–4.0)
MCH: 25.7 pg — ABNORMAL LOW (ref 26.0–34.0)
MCHC: 32.5 g/dL (ref 30.0–36.0)
MCV: 79.1 fL (ref 78.0–100.0)
MONO ABS: 0.2 10*3/uL (ref 0.1–1.0)
Monocytes Relative: 10 %
NEUTROS ABS: 0.7 10*3/uL — AB (ref 1.7–7.7)
NEUTROS PCT: 48 %
PLATELETS: 34 10*3/uL — AB (ref 150–400)
RBC: 3.93 MIL/uL (ref 3.87–5.11)
RDW: 14.6 % (ref 11.5–15.5)
WBC: 1.6 10*3/uL — ABNORMAL LOW (ref 4.0–10.5)

## 2017-01-05 MED ORDER — FUROSEMIDE 40 MG PO TABS: 40.0000 mg | ORAL_TABLET | Freq: Two times a day (BID) | ORAL | 11 refills | Status: DC

## 2017-01-05 MED FILL — FUROSEMIDE 40 MG TABLET: 40 | 30 days supply | Qty: 60 | Fill #0

## 2017-01-05 NOTE — Patient Instructions (Signed)
Please take the lasix 40 mg daily. Come back to clinic in 1 week.

## 2017-01-05 NOTE — Assessment & Plan Note (Addendum)
Katherine Garza presents with a 2 week history of bilateral lower extremity pain and swelling. Reports her right knee is the most painful. Denies any trauma or falls. Denies any fevers but does report chills. No recent travel. Denies any erythema but does note that right knee has felt warm for the past 2 weeks.  Reports monogamous relationship with husband. No history of sexually transmitted disease. No rashes. No history of blood clots. No history of gout.   Both legs are edematous with 2+ pitting edema. Diffiuclt to palpate pulses due to edema but 1+ bilaterally. No pain to palpation. Right knee is edematous and warm to the touch. No erythema or rashes present.   BP is elevated today at 166/90, mildly tachycardic at 100 with temp of 99.9.   History of Hep C with cirrhosis and evidence of portal HTN. Currently only on nadolol. Skin changes on legs appear more consistent with chronic venous stasis and I question the duration of this edema. Possibly related to her cirrhosis.   Last plt count in 2016 was in the 30s.   Assessment: Bilateral lower extremity edema and pain  Plan: Will get Dopplers of both legs to rule out DVT. Will also get ultrasound of right knee to check for effusions. No systemic signs of infection that would be concerning for septic joint infection.   Dopplers negative. Checking cbc due to history of thrombocytopenia - plts 34.  CMP today - not drawn, will check at follow up.   Ultrasound of her right knee shows mild to moderate lateral effusion but significant tissue edema present. Given low plts and low suspicion for septic joint (pain only mild, no systemic signs, no obvious source) opted to not do arthrocentesis today.   Lasix 40 mg daily. RTC in 1 week. Would consider alternative considerations for LE edema if no improvement including ECHO.

## 2017-01-05 NOTE — Telephone Encounter (Signed)
Pt presents with c/o R lower leg being painful for 2 weeks, her work has a clinic and pt was seen there yesterday. RLE 53cms LLE 50cms, appears to be more this am, BP 166/90, HR 100, T 99.9 Today RLE has redness to it and is painful at front of knee and down the outer R calf. Please advise Spoke to dr's narendra and boswell. Dr Charlynn Grimes will see pt

## 2017-01-05 NOTE — Progress Notes (Signed)
   CC: Leg swelling and pain  HPI:  [name] is a 46 y.o. female with a past medical history listed below here today with complaints of lower extremity swelling and pain.  For details of today's visit and the status of her chronic medical issues please refer to the assessment and plan.  Past Medical History:  Diagnosis Date  . Hepatitis C virus infection 01/04/2015  . History of malaria    Multiple episodes w/ 2 hospitalizations    Review of Systems:   See HPI  Physical Exam:  Vitals:   01/05/17 1108  BP: 134/76  Pulse: 80  Temp: 97.3 F (36.3 C)  TempSrc: Oral  SpO2: 100%  Weight: 228 lb 9.6 oz (103.7 kg)  Height: 5\' 2"  (1.575 m)   Physical Exam  Constitutional: She is well-developed, well-nourished, and in no distress. No distress.  HENT:  Head: Normocephalic and atraumatic.  Cardiovascular: Regular rhythm and normal heart sounds.   tachycardic  Pulmonary/Chest: Effort normal and breath sounds normal. No respiratory distress. She has no wheezes.  Musculoskeletal: She exhibits edema.  Both legs are edematous with 2+ pitting edema. Diffiuclt to palpate pulses due to edema but 1+ bilaterally. No pain to palpation. Right knee is edematous and warm to the touch. No erythema or rashes present.   Skin: Skin is warm and dry. No rash noted. She is not diaphoretic. No erythema.     Assessment & Plan:   See Encounters Tab for problem based charting.  Patient seen with Dr. Dareen Piano

## 2017-01-05 NOTE — Progress Notes (Addendum)
VASCULAR LAB PRELIMINARY  PRELIMINARY  PRELIMINARY  PRELIMINARY  Bilateral lower extremity venous duplex completed.    Preliminary report:  Technically difficult due body habitus and edema Bilateral:  No obvious evidence of DVTor superficial thrombosis. Positive for a right Baker's cyst. No evidence of a left Baker's cyst   Katherine Garza, RVS 01/05/2017, 1:28 PM

## 2017-01-08 LAB — PATHOLOGIST SMEAR REVIEW

## 2017-01-08 NOTE — Progress Notes (Signed)
Internal Medicine Clinic Attending  I saw and evaluated the patient.  I personally confirmed the key portions of the history and exam documented by Dr. Boswell and I reviewed pertinent patient test results.  The assessment, diagnosis, and plan were formulated together and I agree with the documentation in the resident's note. 

## 2017-01-08 NOTE — Addendum Note (Signed)
Addended by: [place] Lions on: 01/08/2017 10:37 AM   Modules accepted: Orders

## 2017-01-12 ENCOUNTER — Ambulatory Visit (INDEPENDENT_AMBULATORY_CARE_PROVIDER_SITE_OTHER): Payer: BLUE CROSS/BLUE SHIELD | Admitting: Internal Medicine

## 2017-01-12 VITALS — BP 136/82 | HR 81 | Temp 97.5°F | Ht 62.0 in | Wt 217.6 lb

## 2017-01-12 DIAGNOSIS — K7460 Unspecified cirrhosis of liver: Secondary | ICD-10-CM | POA: Diagnosis not present

## 2017-01-12 DIAGNOSIS — B182 Chronic viral hepatitis C: Secondary | ICD-10-CM | POA: Diagnosis not present

## 2017-01-12 DIAGNOSIS — R600 Localized edema: Secondary | ICD-10-CM

## 2017-01-12 DIAGNOSIS — D696 Thrombocytopenia, unspecified: Secondary | ICD-10-CM

## 2017-01-12 LAB — PROTIME-INR
INR: 1.2
PROTHROMBIN TIME: 15.3 s — AB (ref 11.4–15.2)

## 2017-01-12 LAB — COMPREHENSIVE METABOLIC PANEL
ALT: 24 U/L (ref 14–54)
ANION GAP: 11 (ref 5–15)
AST: 31 U/L (ref 15–41)
Albumin: 4.5 g/dL (ref 3.5–5.0)
Alkaline Phosphatase: 52 U/L (ref 38–126)
BILIRUBIN TOTAL: 0.8 mg/dL (ref 0.3–1.2)
BUN: 12 mg/dL (ref 6–20)
CHLORIDE: 97 mmol/L — AB (ref 101–111)
CO2: 31 mmol/L (ref 22–32)
Calcium: 10.1 mg/dL (ref 8.9–10.3)
Creatinine, Ser: 0.73 mg/dL (ref 0.44–1.00)
Glucose, Bld: 124 mg/dL — ABNORMAL HIGH (ref 65–99)
POTASSIUM: 4 mmol/L (ref 3.5–5.1)
Sodium: 139 mmol/L (ref 135–145)
TOTAL PROTEIN: 8.6 g/dL — AB (ref 6.5–8.1)

## 2017-01-12 LAB — BRAIN NATRIURETIC PEPTIDE: B NATRIURETIC PEPTIDE 5: 8.6 pg/mL (ref 0.0–100.0)

## 2017-01-12 MED ORDER — IBUPROFEN 200 MG PO TABS: 800.0000 mg | ORAL_TABLET | Freq: Three times a day (TID) | ORAL | 2 refills | Status: DC | PRN

## 2017-01-12 NOTE — Progress Notes (Signed)
   CC: follow-up of bilateral lower extremity edema  HPI:  Ms.Jette Midgette is a 46 y.o. 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 She presents with an interpretor.   BL Lower Extremity Edema: Seen 1/26 with a several week history of lower extremity swelling and pain. Also endorsed right knee pain as well. Denied any sort of trauma, previous travel, falls, fevers or chills. BL LE Dopplers were ordered which were negative for DVT. R knee [place] did show a mid-moderate lateral effusion and extensive soft tissue edema. At her visit she was started on Lasix 40 mg daily. Since starting Lasix, she is down 11 lbs and she has noticed an improvement in her swelling and her SOB has completely resolved.   Hepatitis C Infection, Cirrhosis: Last hepatic US 10/17 was without mass concerning for [place]. Will be due for repeat imaging 03/2017.   Past Medical History:  Diagnosis Date  . Hepatitis C virus infection 01/04/2015  . History of malaria    Multiple episodes w/ 2 hospitalizations    Review of Systems:  Review of Systems  Constitutional: Negative for chills and fever.  Respiratory: Negative for cough, shortness of breath (resolved) and wheezing.   Cardiovascular: Positive for orthopnea and leg swelling. Negative for chest pain and palpitations.  Gastrointestinal: Negative for abdominal pain, blood in stool, nausea and vomiting.  Musculoskeletal: Negative for falls and myalgias.  Neurological: Negative for weakness.   Physical Exam: Physical Exam  Constitutional: She is well-developed, well-nourished, and in no distress.  HENT:  Head: Normocephalic and atraumatic.  Cardiovascular: Normal rate and regular rhythm.   No murmur heard. Pulmonary/Chest: Effort normal and breath sounds normal. No respiratory distress.  Abdominal: Soft. Bowel sounds are normal. She  exhibits no distension.  Musculoskeletal: She exhibits edema (1+ pitting edema BL to knee).  Right knee without obvious effusion, erythema or increased warmth. Indicates pain is medial and rates it 5/10.   Skin: Skin is warm and dry. No rash noted. She is not diaphoretic. No erythema.  Psychiatric: Affect and judgment normal.    Vitals:   01/12/17 0951  BP: 136/82  Pulse: 81  Temp: 97.5 F (36.4 C)  TempSrc: Oral  SpO2: 100%  Weight: 217 lb 9.5 oz (98.7 kg)  Height: 5\' 2"  (1.575 m)    Assessment & Plan:   See Encounters Tab for problem based charting.  Patient discussed with Dr. Angelia Mould

## 2017-01-12 NOTE — Assessment & Plan Note (Signed)
Volume status improved on todays examination however still has volume to go. Patient reports compliance with Lasix and has lost 11 lbs since her visit 7 days ago. She reports her SOB with exertion has resolved as well. Dopplers negative for DVTs and R knee [place] did show a lateral knee effusion however showed significant soft tissue edema.  Need to evaluate further why patient has peripheral edema. Ordered CMET, BNP, ECHO. Encouraged patient to continue Lasix and to follow-up 1 week after completing the ECHO.

## 2017-01-12 NOTE — Patient Instructions (Signed)
It was a pleasure meeting you today!  1. Please continue taking your Lasix as directed.  2. I have ordered several blood tests today which need to be performed before you leave the clinic.  3. I have also ordered you a examination called an ECHOCARDIOGRAM. This should be completed within the next 1-2 weeks. 4. Ive also sent you a prescription for a pain medication as well.  5. Please make an appointment in the clinic approximately 1 week after your ECHO has been completed!

## 2017-01-12 NOTE — Assessment & Plan Note (Signed)
Abd [place] from 09/2016 consistent with cirrhosis. No underlying mass seen at that time. Recommend repeat US 02/2017 as continued screening for HCC.  -PT/INR today

## 2017-01-16 NOTE — Progress Notes (Addendum)
Internal Medicine Clinic Attending  Case discussed with Dr. Danford Bad at the time of the visit.  We reviewed the resident's history and exam and pertinent patient test results.  I agree with the assessment, diagnosis, and plan of care documented in the resident's note with the following exception: it appears that high dose Ibuprofen was prescribed.  I will ask Dr Danford Bad to discontinue this medication as it is a high risk medication given her cirrhosis and thrombocytopenia.

## 2017-01-26 ENCOUNTER — Ambulatory Visit (HOSPITAL_COMMUNITY)
Admission: RE | Admit: 2017-01-26 | Discharge: 2017-01-26 | Disposition: A | Discharge status: Home / Self Care | Payer: BLUE CROSS/BLUE SHIELD | Source: Ambulatory Visit | Attending: Internal Medicine | Admitting: Internal Medicine

## 2017-01-26 DIAGNOSIS — R0600 Dyspnea, unspecified: Secondary | ICD-10-CM | POA: Diagnosis present

## 2017-01-26 DIAGNOSIS — R600 Localized edema: Secondary | ICD-10-CM

## 2017-01-26 DIAGNOSIS — I351 Nonrheumatic aortic (valve) insufficiency: Secondary | ICD-10-CM | POA: Insufficient documentation

## 2017-01-26 LAB — ECHOCARDIOGRAM COMPLETE
AVLVOTPG: 13 mmHg
AVPHT: 389 ms
CHL CUP MV DEC (S): 239
E decel time: 239 msec
EERAT: 10.02
FS: 30 % (ref 28–44)
IV/PV OW: 0.9
LA diam end sys: 34 mm
LA vol: 42.2 mL
LADIAMINDEX: 1.72 cm/m2
LASIZE: 34 mm
LAVOLA4C: 41.8 mL
LAVOLIN: 21.3 mL/m2
LV E/e' medial: 10.02
LV E/e'average: 10.02
LV PW d: 10.2 mm — AB (ref 0.6–1.1)
LVELAT: 9.68 cm/s
LVOT SV: 93 mL
LVOT VTI: 36.6 cm
LVOT area: 2.54 cm2
LVOT diameter: 18 mm
LVOT peak vel: 179 cm/s
MV Peak grad: 4 mmHg
MV pk A vel: 101 m/s
MVPKEVEL: 97 m/s
RV LATERAL S' VELOCITY: 14.6 cm/s
TAPSE: 17.8 mm
TDI e' lateral: 9.68
TDI e' medial: 6.53

## 2017-01-26 NOTE — Progress Notes (Signed)
  Echocardiogram 2D Echocardiogram has been performed.  Katherine Garza 01/26/2017, 12:00 PM

## 2017-02-01 ENCOUNTER — Telehealth: Payer: Self-pay | Admitting: Internal Medicine

## 2017-02-01 NOTE — Telephone Encounter (Signed)
APT. REMINDER CALL, LMTCB °

## 2017-02-02 ENCOUNTER — Ambulatory Visit (INDEPENDENT_AMBULATORY_CARE_PROVIDER_SITE_OTHER): Payer: BLUE CROSS/BLUE SHIELD | Admitting: Internal Medicine

## 2017-02-02 VITALS — BP 130/83 | HR 83 | Temp 97.7°F | Ht 62.0 in | Wt 218.5 lb

## 2017-02-02 DIAGNOSIS — D509 Iron deficiency anemia, unspecified: Secondary | ICD-10-CM | POA: Diagnosis not present

## 2017-02-02 DIAGNOSIS — M25562 Pain in left knee: Secondary | ICD-10-CM

## 2017-02-02 DIAGNOSIS — M25561 Pain in right knee: Secondary | ICD-10-CM | POA: Diagnosis not present

## 2017-02-02 DIAGNOSIS — Z8619 Personal history of other infectious and parasitic diseases: Secondary | ICD-10-CM

## 2017-02-02 DIAGNOSIS — R600 Localized edema: Secondary | ICD-10-CM | POA: Diagnosis not present

## 2017-02-02 DIAGNOSIS — M25461 Effusion, right knee: Secondary | ICD-10-CM | POA: Diagnosis not present

## 2017-02-02 DIAGNOSIS — K7460 Unspecified cirrhosis of liver: Secondary | ICD-10-CM | POA: Diagnosis not present

## 2017-02-02 DIAGNOSIS — Z8613 Personal history of malaria: Secondary | ICD-10-CM

## 2017-02-02 DIAGNOSIS — I8510 Secondary esophageal varices without bleeding: Secondary | ICD-10-CM | POA: Diagnosis not present

## 2017-02-02 DIAGNOSIS — L819 Disorder of pigmentation, unspecified: Secondary | ICD-10-CM | POA: Diagnosis not present

## 2017-02-02 MED ORDER — NADOLOL 40 MG PO TABS: 40.0000 mg | ORAL_TABLET | Freq: Every day | ORAL | 11 refills | Status: DC

## 2017-02-02 MED ORDER — TRAMADOL HCL 50 MG PO TABS: 50.0000 mg | ORAL_TABLET | Freq: Two times a day (BID) | ORAL | 1 refills | Status: DC | PRN

## 2017-02-02 MED ORDER — FERROUS SULFATE 325 (65 FE) MG PO TBEC: 325.0000 mg | DELAYED_RELEASE_TABLET | Freq: Every day | ORAL | 3 refills | Status: DC

## 2017-02-02 MED FILL — traMADol HCL 50 MG TABS: 50 | 7 days supply | Qty: 14 | Fill #0

## 2017-02-02 NOTE — Progress Notes (Signed)
    CC: Follow-up for lower extremity edema  HPI: [name] is a 46 y.o. female with PMHx of cirrhosis secondary to hepatitis C, history of hepatitis C status post treatment, grade 1 diastolic heart failure who presents to the clinic for follow-up for lower extremity edema.  Patient first presented to the internal medicine clinic on 01/05/2017 with complaint of bilateral lower extremity pain and swelling. She also noted pain in her bilateral knees, particularly her right knee. On exam, patient was noted to have 2+ pitting edema bilaterally. Original differential diagnosis included heart failure, chronic venous stasis, or volume status changes secondary to cirrhosis. Bilateral lower extremity Dopplers were obtained which were negative for DVT. The right knee ultrasound was obtained which showed only mild to moderate right knee effusion but with significant soft tissue edema. Patient was started on Lasix 40 mg daily and instructed to follow up in 1 week. On follow-up, patient has lost 11 pounds of weight in her lower extremity edema was improved. Shortness of breath was also resolved. An echocardiogram was obtained to show EF of 65-70% and grade 1 diastolic dysfunction. There is no significant valvular abnormalities. Today, patient states the swelling has improved, but she has significant bilateral lower extremity pain extending from her ankles up to her shins bilaterally. She describes the pain as burning, sharp, throbbing. She had previously been taking ibuprofen 800 mg 3 times a day as needed, but this was discontinued due to increased risk of bleeding given her thrombocytopenia. Pain is severe.  Past Medical History:  Diagnosis Date  . Hepatitis C virus infection 01/04/2015  . History of malaria    Multiple episodes w/ 2 hospitalizations    Review of Systems: Please see pertinent ROS reviewed in HPI and problem based charting.   Physical Exam: Vitals:   02/02/17 0927  BP: 130/83    Pulse: 83  Temp: 97.7 F (36.5 C)  TempSrc: Oral  SpO2: 99%  Weight: 218 lb 8 oz (99.1 kg)  Height: 5\' 2"  (1.575 m)   General: Vital signs reviewed.  Patient is well-developed and well-nourished, in no acute distress and cooperative with exam.  Cardiovascular: RRR, S1 normal, S2 normal, no murmurs, gallops, or rubs. Pulmonary/Chest: Clear to auscultation bilaterally, no wheezes, rales, or rhonchi. Abdominal: Soft, non-tender, non-distended, BS +, No fluid wave Extremities: 1-2 + nonpitting brawny and woody lower extremity edema bilaterally, hyperpigmentation throughout bilateral distal lower extremities and dorsal surface of bilateral feet. No calf tenderness. No knee tenderness on palpation.  Neurological: Sensory intact to light touch bilaterally in lower extremities.   Assessment & Plan:  See encounters tab for problem based medical decision making. Patient discussed with Dr. Lynnae January

## 2017-02-02 NOTE — Patient Instructions (Signed)
Take Nadolol 40 mg once a day. Start taking ferrous sulfate once a day.  Continue taking Lasix.  Stop taking ibuprofen.  Start taking tramadol 50 mg twice a day as needed for pain. Please follow up in 4 weeks.

## 2017-02-02 NOTE — Assessment & Plan Note (Signed)
Laboratory workup is consistent with iron deficiency anemia.  Plan: -Start ferrous sulfate 325 mg once a day

## 2017-02-02 NOTE — Assessment & Plan Note (Signed)
Patient has a history of cirrhosis with grade 1 esophageal varices. She was previously on nadolol but has no longer been taking it. She has been treated for hepatitis C. No prior history of ascites or paracenteses. No palpable ascites or fluid wave on examination today. Blood pressure is 130/83 with a heart rate of 83. She has been compliant with Lasix. Patient is to have a repeat abdominal ultrasound in March 2018 for continued surveillance for hepatocellular carcinoma. [facility].  Plan: -Restart nadolol 40 mg once a day for variceal bleeding prophylaxis -If patient remains on a nonselective beta blocker she does not need repeat screening for varices

## 2017-02-02 NOTE — Assessment & Plan Note (Signed)
Patient first presented to the internal medicine clinic on 01/05/2017 with complaint of bilateral lower extremity pain and swelling. She also noted pain in her bilateral knees, particularly her right knee. On exam, patient was noted to have 2+ pitting edema bilaterally. Original differential diagnosis included heart failure, chronic venous stasis, or volume status changes secondary to cirrhosis. Bilateral lower extremity Dopplers were obtained which were negative for DVT. The right knee ultrasound was obtained which showed only mild to moderate right knee effusion but with significant soft tissue edema. Patient was started on Lasix 40 mg daily and instructed to follow up in 1 week. On follow-up, patient has lost 11 pounds of weight in her lower extremity edema was improved. Shortness of breath was also resolved. An echocardiogram was obtained to show EF of 65-70% and grade 1 diastolic dysfunction. There is no significant valvular abnormalities. Today, patient states the swelling has improved, but she has significant bilateral lower extremity pain extending from her ankles up to her shins bilaterally. She describes the pain as burning, sharp, throbbing. She had previously been taking ibuprofen 800 mg 3 times a day as needed, but this was discontinued due to increased risk of bleeding given her thrombocytopenia. Pain is severe. On examination, patient has 1-2+ nonpitting brawny and woody edema in her lower extremities bilaterally with hyperpigmentation throughout her distal lower extremities extending to dorsal surface of feet bilaterally.  Assessment: Pain and edema of lower extremities. This may be secondary to chronic venous stasis despite patient stating that her symptoms are new in the last 1 month. However, I am concerned for development of lipodermatosclerosis given the appearance of her lower extremities. Especially with the skin induration and hyperpigmentation of the lower third of the  leg.  Plan: -Continue Lasix -Leg elevation -Weight loss -Patient will follow up in 4 weeks -Tramadol 50 mg twice a day when necessary -At follow-up, please address compression therapy and more aggressive modalities for chronic venous stasis/lipodermatosclerosis

## 2017-02-05 NOTE — Progress Notes (Signed)
Internal Medicine Clinic Attending  Case discussed with Dr. Burns soon after the resident saw the patient.  We reviewed the resident's history and exam and pertinent patient test results.  I agree with the assessment, diagnosis, and plan of care documented in the resident's note. 

## 2017-03-02 ENCOUNTER — Ambulatory Visit (INDEPENDENT_AMBULATORY_CARE_PROVIDER_SITE_OTHER): Payer: BLUE CROSS/BLUE SHIELD | Admitting: Internal Medicine

## 2017-03-02 VITALS — BP 128/76 | HR 77 | Temp 97.6°F | Ht 62.0 in | Wt 212.7 lb

## 2017-03-02 DIAGNOSIS — R600 Localized edema: Secondary | ICD-10-CM

## 2017-03-02 DIAGNOSIS — Z79899 Other long term (current) drug therapy: Secondary | ICD-10-CM | POA: Diagnosis not present

## 2017-03-02 MED ORDER — FUROSEMIDE 40 MG PO TABS: 40.0000 mg | ORAL_TABLET | Freq: Every day | ORAL | 11 refills | Status: DC

## 2017-03-02 MED FILL — FUROSEMIDE 40 MG TABLET: 40 | 30 days supply | Qty: 30 | Fill #0

## 2017-03-02 NOTE — Assessment & Plan Note (Signed)
A: Pt here for 1 month f/u of LE edema. She is on lasix 40mg  daily. Her LE edema and pain has improved. She has no complaints this visit. She is tolerating lasix and has been elevating her legs. Weight is down to 212 from 218 last month. On exam she has trace pedal edema.   P: continue lasix. F/u in 3 months or sooner if needed.

## 2017-03-02 NOTE — Progress Notes (Signed)
   CC: LE edema   HPI:  Katherine Garza is a 46 y.o. with PMHx as outlined below who presents to clinic for LE edema follow up. Please see problem list for further details of patient's chronic medical issues.   Past Medical History:  Diagnosis Date  . Hepatitis C virus infection 01/04/2015  . History of malaria    Multiple episodes w/ 2 hospitalizations    Review of Systems:  Denies orthopnea, weight gain, SOB, and dysuria.   Physical Exam:  Vitals:   03/02/17 0929  BP: 128/76  Pulse: 77  Temp: 97.6 F (36.4 C)  TempSrc: Oral  SpO2: 100%  Weight: 212 lb 11.2 oz (96.5 kg)  Height: 5\' 2"  (1.575 m)   Physical Exam  Constitutional:  appears well-developed and well-nourished. No distress.  HENT:  Head: Normocephalic and atraumatic.  Nose: Nose normal.  Cardiovascular: Normal rate, regular rhythm and normal heart sounds.  Exam reveals no gallop and no friction rub.   No murmur heard. Pulmonary/Chest: Effort normal and breath sounds normal. No respiratory distress.  has no wheezes.no rales.  Abdominal: Soft. Bowel sounds are normal. distended There is no tenderness. There is no rebound and no guarding.  Neurological: alert and oriented to person, place, and time.  Ext: trace pedal edema b/l with hyperpigmented skin, no open skin breaks or oozing.    Assessment & Plan:   See Encounters Tab for problem based charting.  Patient discussed with Dr. Daryll Drown

## 2017-03-05 NOTE — Progress Notes (Signed)
Internal Medicine Clinic Attending  Case discussed with Dr. Truong soon after the resident saw the patient.  We reviewed the resident's history and exam and pertinent patient test results.  I agree with the assessment, diagnosis, and plan of care documented in the resident's note. 

## 2017-04-05 ENCOUNTER — Other Ambulatory Visit: Payer: Self-pay | Admitting: Gastroenterology

## 2017-04-05 DIAGNOSIS — K7469 Other cirrhosis of liver: Secondary | ICD-10-CM

## 2017-04-06 MED FILL — FUROSEMIDE 40 MG TABLET: 40 | 30 days supply | Qty: 30 | Fill #1

## 2017-04-12 ENCOUNTER — Other Ambulatory Visit: Payer: BLUE CROSS/BLUE SHIELD

## 2017-04-13 ENCOUNTER — Ambulatory Visit
Admission: RE | Admit: 2017-04-13 | Discharge: 2017-04-13 | Disposition: A | Discharge status: Home / Self Care | Payer: BLUE CROSS/BLUE SHIELD | Source: Ambulatory Visit | Attending: Gastroenterology | Admitting: Gastroenterology

## 2017-04-13 DIAGNOSIS — K7469 Other cirrhosis of liver: Secondary | ICD-10-CM

## 2017-05-04 ENCOUNTER — Telehealth: Payer: Self-pay | Admitting: *Deleted

## 2017-05-04 MED FILL — FUROSEMIDE 40 MG TABLET: 40 | 30 days supply | Qty: 30 | Fill #2

## 2017-05-04 NOTE — Telephone Encounter (Signed)
Pt presented to front desk wanting her medicine refilled, called [facility], they are filling her furosemide

## 2017-05-18 ENCOUNTER — Encounter: Payer: Self-pay | Admitting: *Deleted

## 2017-06-04 MED FILL — FUROSEMIDE 40 MG TABLET: 40 | 30 days supply | Qty: 30 | Fill #3

## 2017-07-23 MED FILL — FUROSEMIDE 40 MG TABLET: 40 | 30 days supply | Qty: 30 | Fill #4

## 2017-08-30 MED FILL — FUROSEMIDE 40 MG TABLET: 40 | 30 days supply | Qty: 30 | Fill #5

## 2017-10-01 MED FILL — FUROSEMIDE 40 MG TABLET: 40 | 30 days supply | Qty: 30 | Fill #6

## 2017-10-29 MED FILL — FUROSEMIDE 40 MG TAB: 40 | 30 days supply | Qty: 30 | Fill #7

## 2017-11-13 ENCOUNTER — Other Ambulatory Visit: Payer: Self-pay | Admitting: Gastroenterology

## 2017-11-13 DIAGNOSIS — K7460 Unspecified cirrhosis of liver: Secondary | ICD-10-CM

## 2017-11-26 ENCOUNTER — Ambulatory Visit
Admission: RE | Admit: 2017-11-26 | Discharge: 2017-11-26 | Disposition: A | Discharge status: Home / Self Care | Payer: BLUE CROSS/BLUE SHIELD | Source: Ambulatory Visit | Attending: Gastroenterology | Admitting: Gastroenterology

## 2017-11-26 DIAGNOSIS — K7460 Unspecified cirrhosis of liver: Secondary | ICD-10-CM

## 2017-12-10 MED FILL — FUROSEMIDE 40 MG TAB: 40 | 30 days supply | Qty: 30 | Fill #8

## 2018-01-08 IMAGING — US US ABDOMEN COMPLETE
1 series · 14 of 25 positions shown · non-contrast
Comparison: 09/20/2016

CLINICAL DATA: Nonalcoholic micronodular cirrhosis of the liver,
hepatitis-C

EXAM:
ABDOMEN ULTRASOUND COMPLETE

[Series 1: us abdomen complete · 0.17mm/px · 14 of 101 slices shown]
[im 1/101]
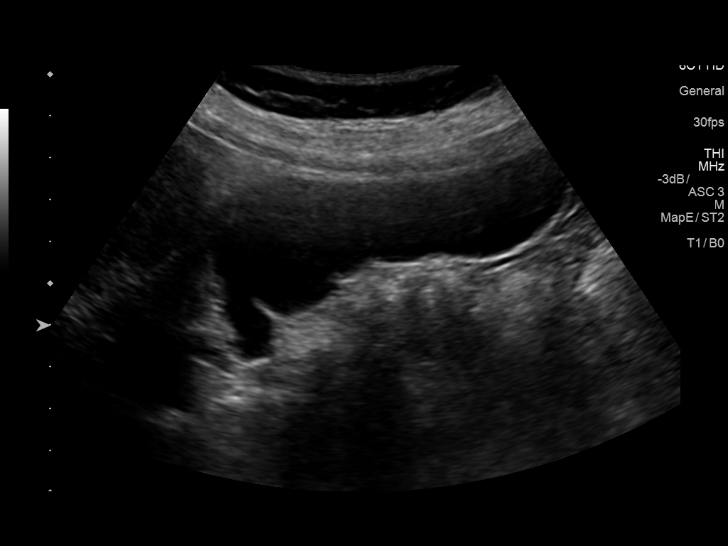
[im 9/101]
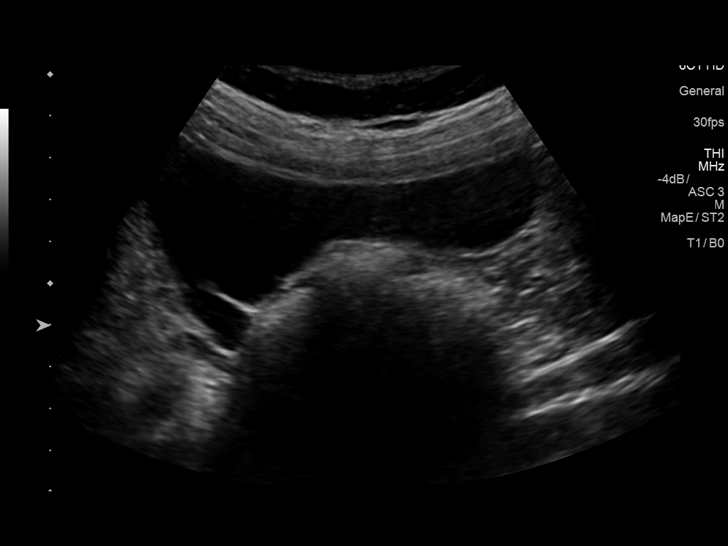
[im 17/101]
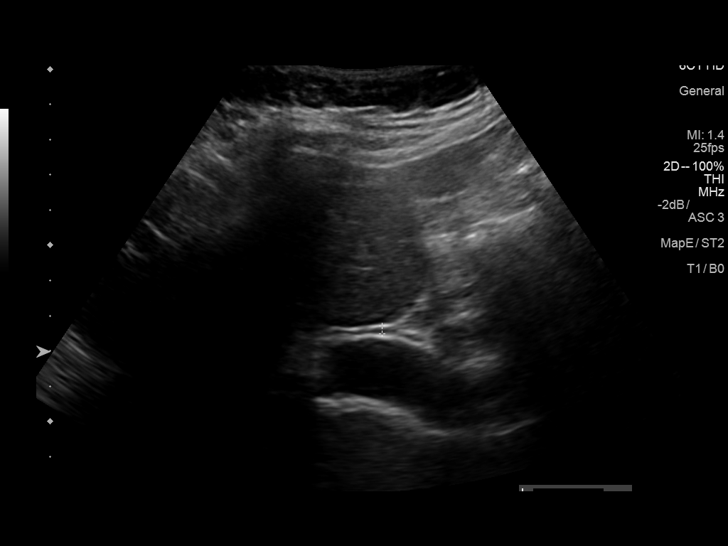
[im 26/101]
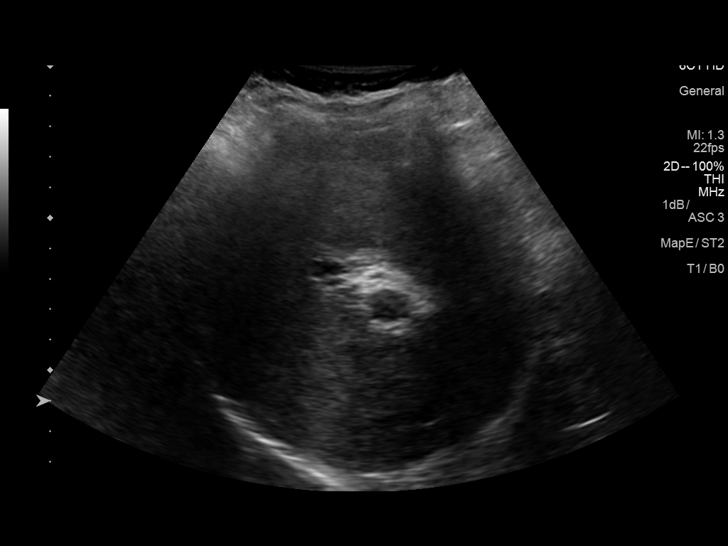
[im 34/101]
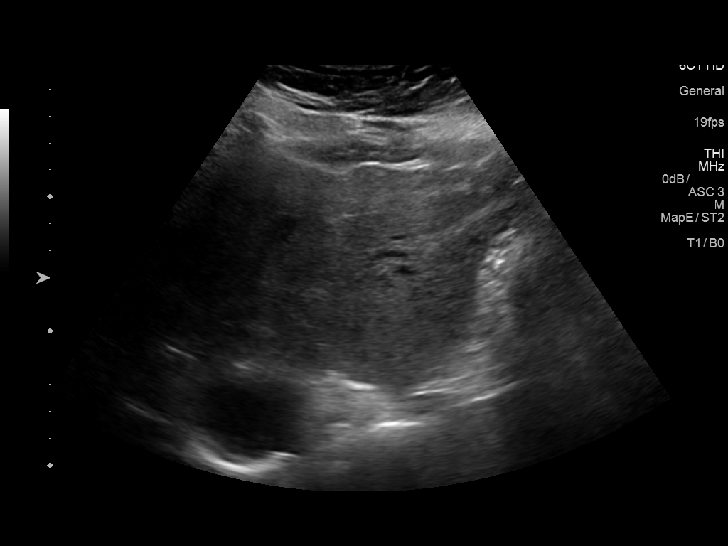
[im 38/101]
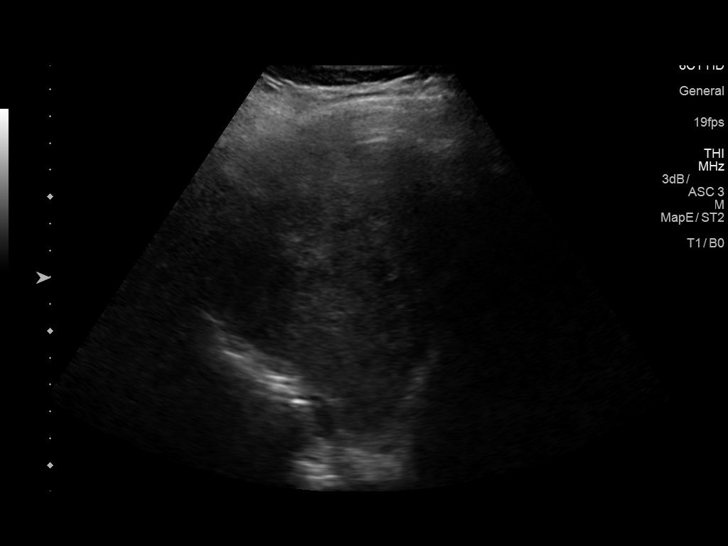
[im 46/101]
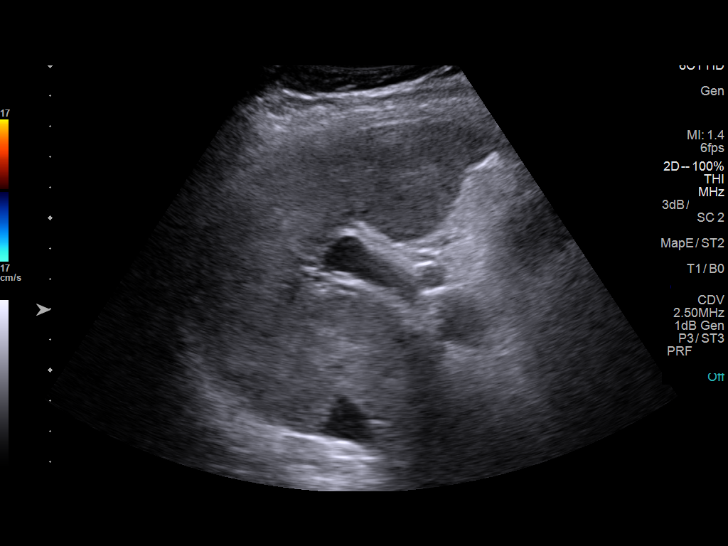
[im 55/101]
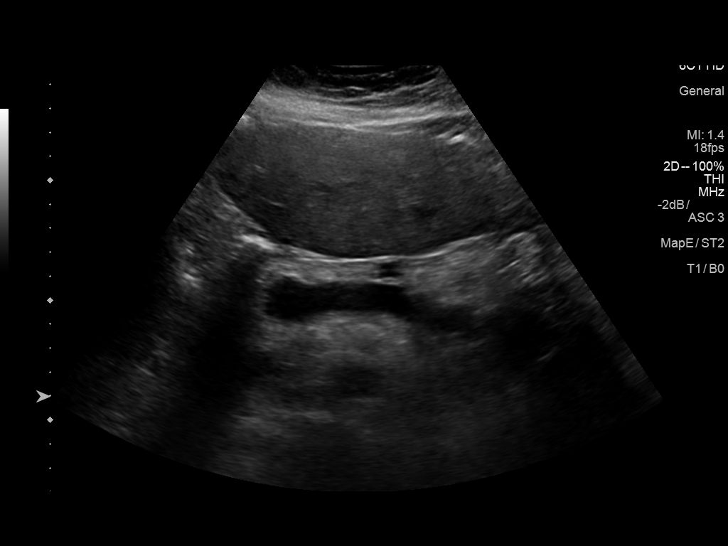
[im 63/101]
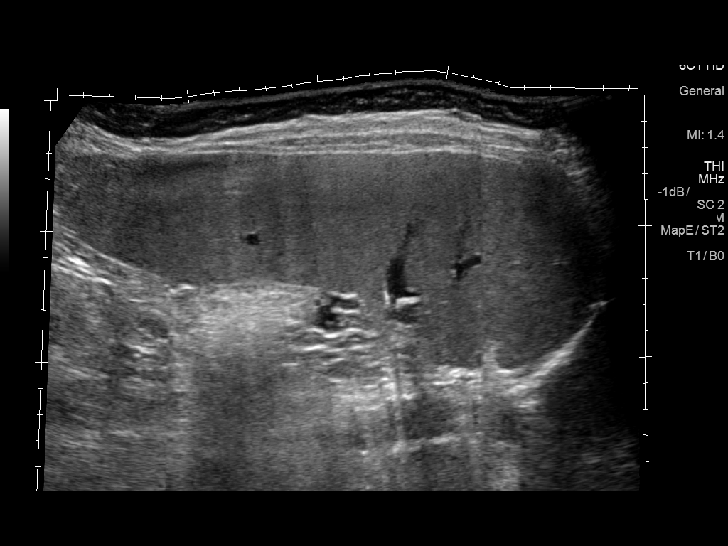
[im 67/101]
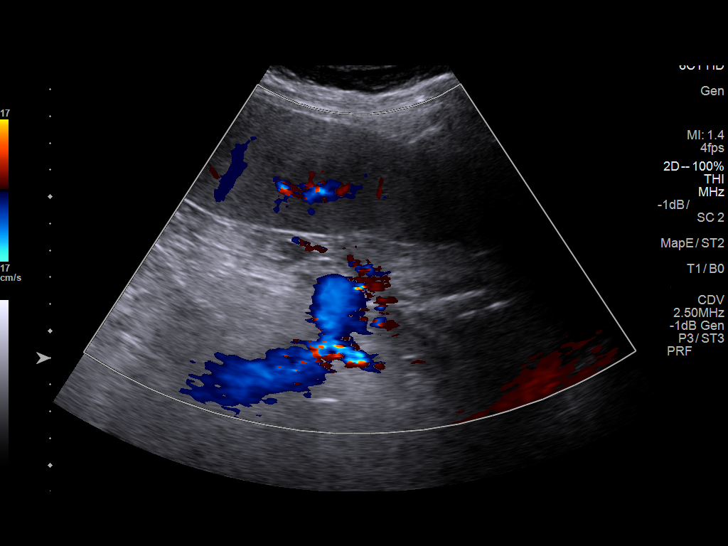
[im 76/101]
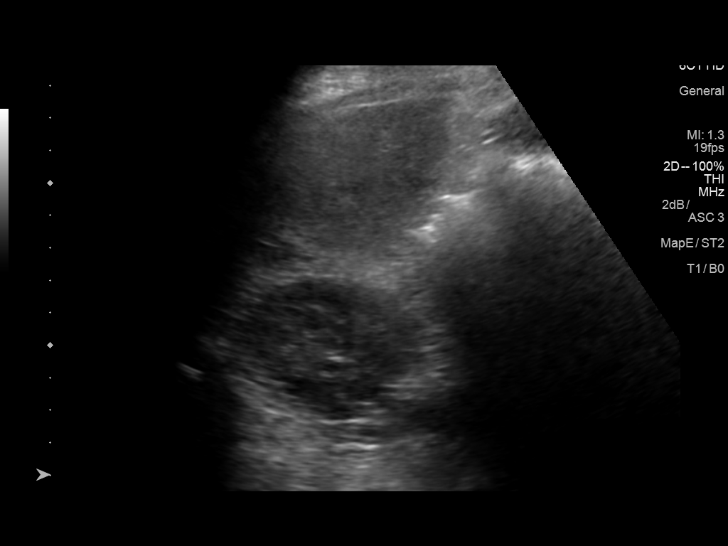
[im 84/101]
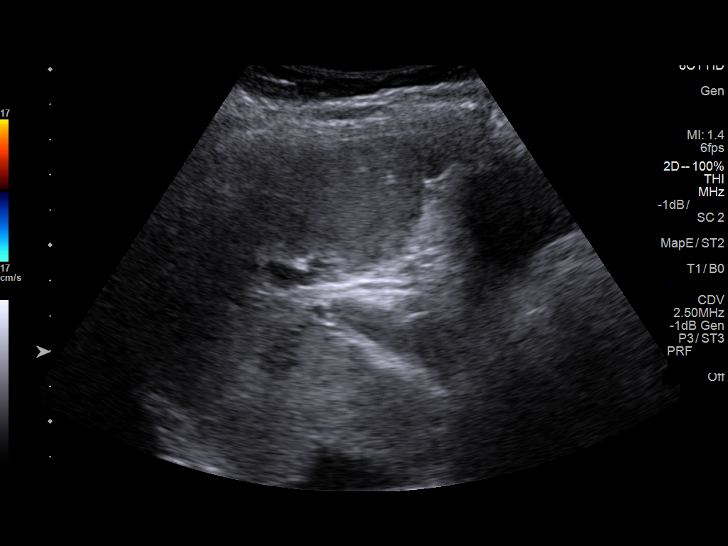
[im 92/101]
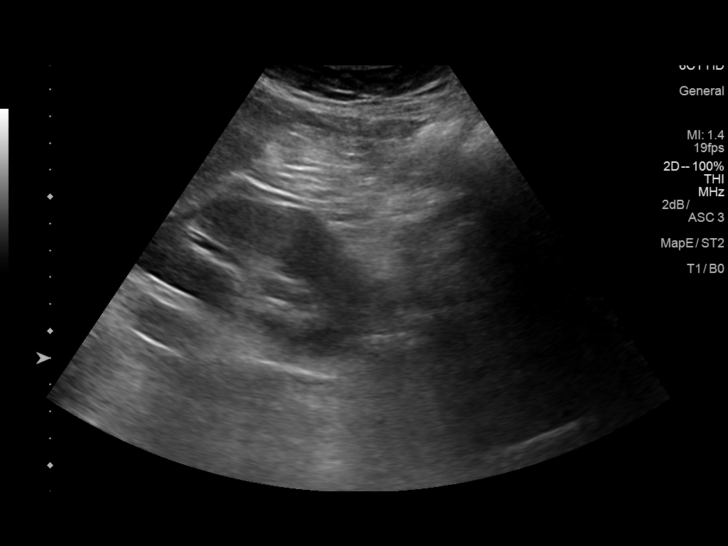
[im 101/101]
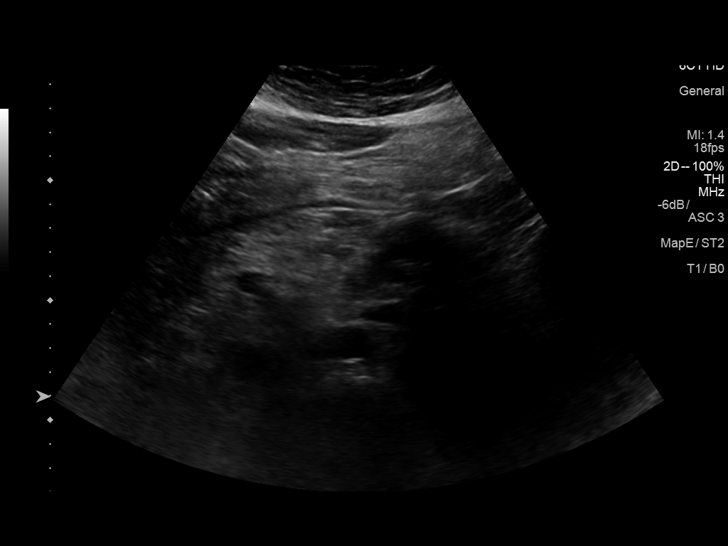

[14 of 25 positions shown; findings below may reference images not displayed]

FINDINGS: Gallbladder: Normally distended without stones or wall thickening.
No pericholecystic fluid or sonographic Murphy sign.

Common bile duct: Diameter: Normal caliber 2 mm diameter

Liver: Inhomogeneous parenchymal echogenicity. Nodular hepatic
contours. No discrete hepatic mass visualized. Hepatopetal portal
venous flow.

IVC: Normal appearance

Pancreas: Distal tail obscured by bowel gas, visualized portion
normal appearance

Spleen: Enlarged, 20.0 cm length with calculated volume 14 10 mL. No
discrete splenic mass lesion.

Right Kidney: Length: 11.3 cm. Normal morphology without mass or
hydronephrosis.

Left Kidney: Length: 11.2 cm. Normal morphology without mass or
hydronephrosis.

Abdominal aorta: Visualized portion normal caliber

Other findings: No free fluid
IMPRESSION: Cirrhotic appearing liver with splenomegaly.

Otherwise negative exam.

## 2018-01-15 MED FILL — FUROSEMIDE 40 MG TAB: 40 | 30 days supply | Qty: 30 | Fill #9

## 2018-02-19 MED FILL — FUROSEMIDE 40 MG TAB: 40 | 30 days supply | Qty: 30 | Fill #10

## 2018-04-01 ENCOUNTER — Other Ambulatory Visit: Payer: Self-pay | Admitting: *Deleted

## 2018-04-01 DIAGNOSIS — R600 Localized edema: Secondary | ICD-10-CM

## 2018-04-01 NOTE — Telephone Encounter (Signed)
Pt will need to follow up in clinic, it has been over a year I would feel uncomfortable continuing to prescribe without her being evaluated, labs drawn, etc

## 2018-04-04 ENCOUNTER — Telehealth: Payer: Self-pay | Admitting: *Deleted

## 2018-04-04 NOTE — Telephone Encounter (Signed)
PT WALK IN Pt presented at desk wanting all medicine refilled per [place]., she was informed per dr winfrey's note 4/22 that pt must be seen for refills, will add her to open slot in

## 2018-04-08 ENCOUNTER — Encounter: Payer: Self-pay | Admitting: Internal Medicine

## 2018-04-08 ENCOUNTER — Ambulatory Visit (INDEPENDENT_AMBULATORY_CARE_PROVIDER_SITE_OTHER): Payer: BLUE CROSS/BLUE SHIELD | Admitting: Internal Medicine

## 2018-04-08 VITALS — BP 144/69 | HR 72 | Temp 97.7°F | Ht 62.0 in | Wt 217.5 lb

## 2018-04-08 DIAGNOSIS — D61818 Other pancytopenia: Secondary | ICD-10-CM | POA: Diagnosis not present

## 2018-04-08 DIAGNOSIS — Z79899 Other long term (current) drug therapy: Secondary | ICD-10-CM

## 2018-04-08 DIAGNOSIS — R161 Splenomegaly, not elsewhere classified: Secondary | ICD-10-CM | POA: Diagnosis not present

## 2018-04-08 DIAGNOSIS — I8510 Secondary esophageal varices without bleeding: Secondary | ICD-10-CM | POA: Diagnosis not present

## 2018-04-08 DIAGNOSIS — R600 Localized edema: Secondary | ICD-10-CM

## 2018-04-08 DIAGNOSIS — K7460 Unspecified cirrhosis of liver: Secondary | ICD-10-CM | POA: Diagnosis not present

## 2018-04-08 DIAGNOSIS — K766 Portal hypertension: Secondary | ICD-10-CM

## 2018-04-08 MED ORDER — FUROSEMIDE 40 MG PO TABS: 40.0000 mg | ORAL_TABLET | Freq: Every day | ORAL | 11 refills | Status: DC

## 2018-04-08 MED FILL — FUROSEMIDE 40 MG TAB: 40 | 30 days supply | Qty: 30 | Fill #0

## 2018-04-08 NOTE — Assessment & Plan Note (Signed)
Patient has a history of hepatic cirrhosis, with grade 1 esophageal varices.  Reports compliance with her nadolol 40 mg daily.  She follows regularly with [facility].  Last abdominal ultrasound was performed in December 2018 for [facility] screening, no suspicious masses were seen.  She has not had labs checked in our system in over a year.  We will request records from Dr. Estell Harpin office prior to repeating labs. -- Request records [facility]

## 2018-04-08 NOTE — Assessment & Plan Note (Signed)
Patient has chronic lower extremity edema secondary to hepatic cirrhosis and portal hypertension.  She is prescribed Lasix 40 mg daily.  Requesting refill.  She reports some mild worsening of her edema since running out of her prescription. --Refilled Lasix 40 mg daily

## 2018-04-08 NOTE — Assessment & Plan Note (Signed)
Patient has a history of pancytopenia felt to be secondary to splenomegaly / sequestration from portal hypertension.  She was previously referred to hematology, I do not see these records.  We are requesting records from Dr. Estell Harpin office prior to repeating labs.  Pending results, consider repeat CBC and heme referral.

## 2018-04-08 NOTE — Patient Instructions (Addendum)
FOLLOW-UP INSTRUCTIONS When: 3 months For: PCP follow up What to bring: Medications   Ms. Bitter,  It was a pleasure to meet you. Please continue to take your medicines as previously prescribed. I have sent a refill of your lasix to your pharmacy. We will obtain records from Dr. Estell Harpin office. Please follow up with your PCP in 3 months. If you have any questions or concerns, call our clinic at [phone] or after hours call [phone] and ask for the internal medicine resident on call. Thank you!  - Dr. Philipp Ovens

## 2018-04-08 NOTE — Progress Notes (Signed)
   CC: Cirrhosis follow up  HPI:  [name] is a 47 y.o. female with past medical history outlined below here for cirrhosis follow up. For the details of today's visit, please refer to the assessment and plan.  Past Medical History:  Diagnosis Date  . Hepatitis C virus infection 01/04/2015  . History of malaria    Multiple episodes w/ 2 hospitalizations    Review of Systems  Cardiovascular: Positive for leg swelling.  Gastrointestinal: Negative for abdominal pain.    Physical Exam:  Vitals:   04/08/18 0837  BP: (!) 144/69  Pulse: 72  Temp: 97.7 F (36.5 C)  TempSrc: Oral  SpO2: 100%  Weight: 217 lb 8 oz (98.7 kg)  Height: 5\' 2"  (1.575 m)    Constitutional: NAD, appears comfortable Cardiovascular: RRR, no murmurs, rubs, or gallops.  Pulmonary/Chest: CTAB, no wheezes, rales, or rhonchi. Extremities: Warm and well perfused. Bilateral non pitting edema Psychiatric: Normal mood and affect  Assessment & Plan:   See Encounters Tab for problem based charting.  Patient discussed with Dr. Eppie Gibson

## 2018-04-13 NOTE — Progress Notes (Signed)
Case discussed with Dr. Guilloud at the time of the visit.  We reviewed the resident's history and exam and pertinent patient test results.  I agree with the assessment, diagnosis and plan of care documented in the resident's note. 

## 2018-05-02 MED FILL — FUROSEMIDE 40 MG TAB: 40 | 30 days supply | Qty: 30 | Fill #1

## 2018-06-06 ENCOUNTER — Other Ambulatory Visit: Payer: Self-pay | Admitting: Gastroenterology

## 2018-06-06 DIAGNOSIS — K7460 Unspecified cirrhosis of liver: Secondary | ICD-10-CM

## 2018-06-06 MED FILL — FUROSEMIDE 40 MG TAB: 40 | 30 days supply | Qty: 30 | Fill #2

## 2018-06-18 ENCOUNTER — Ambulatory Visit
Admission: RE | Admit: 2018-06-18 | Discharge: 2018-06-18 | Disposition: A | Discharge status: Home / Self Care | Payer: BLUE CROSS/BLUE SHIELD | Source: Ambulatory Visit | Attending: Gastroenterology | Admitting: Gastroenterology

## 2018-06-18 DIAGNOSIS — K7460 Unspecified cirrhosis of liver: Secondary | ICD-10-CM

## 2018-07-08 ENCOUNTER — Encounter: Payer: Self-pay | Admitting: Internal Medicine

## 2018-07-08 ENCOUNTER — Ambulatory Visit: Payer: BLUE CROSS/BLUE SHIELD | Admitting: Internal Medicine

## 2018-07-08 ENCOUNTER — Other Ambulatory Visit: Payer: Self-pay

## 2018-07-08 VITALS — BP 141/79 | HR 78 | Temp 97.6°F | Ht 62.0 in | Wt 218.6 lb

## 2018-07-08 DIAGNOSIS — D61818 Other pancytopenia: Secondary | ICD-10-CM

## 2018-07-08 DIAGNOSIS — Z23 Encounter for immunization: Secondary | ICD-10-CM

## 2018-07-08 DIAGNOSIS — Z8619 Personal history of other infectious and parasitic diseases: Secondary | ICD-10-CM

## 2018-07-08 DIAGNOSIS — Z79899 Other long term (current) drug therapy: Secondary | ICD-10-CM

## 2018-07-08 DIAGNOSIS — K7460 Unspecified cirrhosis of liver: Secondary | ICD-10-CM

## 2018-07-08 DIAGNOSIS — Z0000 Encounter for general adult medical examination without abnormal findings: Secondary | ICD-10-CM

## 2018-07-08 LAB — PROTIME-INR
INR: 1.26
PROTHROMBIN TIME: 15.7 s — AB (ref 11.4–15.2)

## 2018-07-08 NOTE — Progress Notes (Signed)
   CC: follow up of cirrhosis, health maintenance  HPI:  [name] is a 47 y.o. female with PMH below.  She is here to follow up her hepatic cirrhosis and we addressed her health maintenance today.  She will receive her TDap.    Please see A&P for status of the patient's chronic medical conditions  Past Medical History:  Diagnosis Date  . Hepatitis C virus infection 01/04/2015  . History of malaria    Multiple episodes w/ 2 hospitalizations   Review of Systems:  ROS: Pulmonary: pt denies increased work of breathing, shortness of breath,  Cardiac: pt denies palpitations, chest pain,  Abdominal: pt denies abdominal pain, nausea, vomiting, or diarrhea  Physical Exam:  Vitals:   07/08/18 1319  BP: (!) 141/79  Pulse: 78  Temp: 97.6 F (36.4 C)  TempSrc: Oral  SpO2: 100%  Weight: 218 lb 9.6 oz (99.2 kg)  Height: 5\' 2"  (1.575 m)   Physical Exam  Constitutional: No distress.  Cardiovascular: Normal rate, regular rhythm and normal heart sounds. Exam reveals no gallop and no friction rub.  No murmur heard. Pulmonary/Chest: Effort normal and breath sounds normal. No respiratory distress. She has no wheezes. She has no rales. She exhibits no tenderness.  Abdominal: She exhibits no mass. There is no tenderness. There is no rebound and no guarding.  Musculoskeletal: She exhibits edema (bilateral non pitting lower extremity edema).  Neurological: She is alert.  Skin: She is not diaphoretic.    Social History   Socioeconomic History  . Marital status: Married    Spouse name: Not on file  . Number of children: Not on file  . Years of education: Not on file  . Highest education level: Not on file  Occupational History  . Not on file  Social Needs  . Financial resource strain: Not on file  . Food insecurity:    Worry: Not on file    Inability: Not on file  . Transportation needs:    Medical: Not on file    Non-medical: Not on file  Tobacco Use  . Smoking  status: Never Smoker  . Smokeless tobacco: Never Used  Substance and Sexual Activity  . Alcohol use: No    Alcohol/week: 0.0 oz  . Drug use: No  . Sexual activity: Not on file  Lifestyle  . Physical activity:    Days per week: Not on file    Minutes per session: Not on file  . Stress: Not on file  Relationships  . Social connections:    Talks on phone: Not on file    Gets together: Not on file    Attends religious service: Not on file    Active member of club or organization: Not on file    Attends meetings of clubs or organizations: Not on file    Relationship status: Not on file  . Intimate partner violence:    Fear of current or ex partner: Not on file    Emotionally abused: Not on file    Physically abused: Not on file    Forced sexual activity: Not on file  Other Topics Concern  . Not on file  Social History Narrative   Moved from [place] in 09/2014 w/ husband and son. Previously worked as a Psychologist, sport and exercise w/ cows, Astronomer, and pigs.     No family history on file.  Assessment & Plan:   See Encounters Tab for problem based charting.  Patient discussed with Dr. Rebeca Alert

## 2018-07-08 NOTE — Patient Instructions (Addendum)
Ms. Wann.  We will check some labwork today.  I will call you with the results.  Please follow up with your GI doctor.  Also please return for a pap smear in 2 weeks.

## 2018-07-09 LAB — CBC WITH DIFFERENTIAL/PLATELET
Basophils Absolute: 0 10*3/uL (ref 0.0–0.2)
Basos: 1 %
EOS (ABSOLUTE): 0 10*3/uL (ref 0.0–0.4)
EOS: 2 %
HEMATOCRIT: 33.9 % — AB (ref 34.0–46.6)
HEMOGLOBIN: 11.4 g/dL (ref 11.1–15.9)
IMMATURE GRANULOCYTES: 1 %
Immature Grans (Abs): 0 10*3/uL (ref 0.0–0.1)
LYMPHS ABS: 0.6 10*3/uL — AB (ref 0.7–3.1)
Lymphs: 45 %
MCH: 27.4 pg (ref 26.6–33.0)
MCHC: 33.6 g/dL (ref 31.5–35.7)
MCV: 82 fL (ref 79–97)
MONOS ABS: 0.1 10*3/uL (ref 0.1–0.9)
Monocytes: 6 %
NEUTROS ABS: 0.7 10*3/uL — AB (ref 1.4–7.0)
Neutrophils: 45 %
Platelets: 35 10*3/uL — CL (ref 150–450)
RBC: 4.16 x10E6/uL (ref 3.77–5.28)
RDW: 15.5 % — AB (ref 12.3–15.4)
WBC: 1.4 10*3/uL — CL (ref 3.4–10.8)

## 2018-07-09 LAB — CMP14 + ANION GAP
ALBUMIN: 4.2 g/dL (ref 3.5–5.5)
ALK PHOS: 57 IU/L (ref 39–117)
ALT: 15 IU/L (ref 0–32)
AST: 17 IU/L (ref 0–40)
Albumin/Globulin Ratio: 1.3 (ref 1.2–2.2)
Anion Gap: 11 mmol/L (ref 10.0–18.0)
BILIRUBIN TOTAL: 0.4 mg/dL (ref 0.0–1.2)
BUN/Creatinine Ratio: 22 (ref 9–23)
BUN: 14 mg/dL (ref 6–24)
CO2: 22 mmol/L (ref 20–29)
CREATININE: 0.64 mg/dL (ref 0.57–1.00)
Calcium: 8.8 mg/dL (ref 8.7–10.2)
Chloride: 107 mmol/L — ABNORMAL HIGH (ref 96–106)
GFR calc Af Amer: 121 mL/min/{1.73_m2} (ref >59)
GFR, EST NON AFRICAN AMERICAN: 105 mL/min/{1.73_m2} (ref >59)
GLOBULIN, TOTAL: 3.2 g/dL (ref 1.5–4.5)
Glucose: 212 mg/dL — ABNORMAL HIGH (ref 65–99)
POTASSIUM: 4.3 mmol/L (ref 3.5–5.2)
Sodium: 140 mmol/L (ref 134–144)
TOTAL PROTEIN: 7.4 g/dL (ref 6.0–8.5)

## 2018-07-09 LAB — LIPID PANEL
CHOL/HDL RATIO: 4.9 ratio — AB (ref 0.0–4.4)
Cholesterol, Total: 136 mg/dL (ref 100–199)
HDL: 28 mg/dL — AB (ref >39)
LDL Calculated: 86 mg/dL (ref 0–99)
Triglycerides: 108 mg/dL (ref 0–149)
VLDL CHOLESTEROL CAL: 22 mg/dL (ref 5–40)

## 2018-07-09 LAB — HEPATITIS B SURFACE ANTIBODY, QUANTITATIVE: Hepatitis B Surf Ab Quant: >1000 m[IU]/mL (ref >9.9)

## 2018-07-09 NOTE — Assessment & Plan Note (Signed)
Patient followed by [facility] Dr. Penelope Coop.  Her last [place] was earlier this month, continues to show cirrhosis and evidence of portal hypertension.  Treated for hep c successfully with harvoni.  She is currently taking nadolol 40mg  and furosemide 40mg  daily.  Has had pancytopenia as well thought to be related to her liver disease. She is feeling well today with no complaints, her lungs are clear, does not appear overtly overloaded, some nonpitting edema in her lower extremities.    -cmp/ PT INR, cbc -will check a hep b surface ab titer today -TDap

## 2018-07-09 NOTE — Assessment & Plan Note (Signed)
Pt due for Tdap today, will also check a lipid panel.

## 2018-07-09 NOTE — Progress Notes (Signed)
Internal Medicine Clinic Attending  Case discussed with Dr. Shan Levans at the time of the visit.  We reviewed the resident's history and exam and pertinent patient test results.  I agree with the assessment, diagnosis, and plan of care documented in the resident's note.  Continues to follow with GI for HepC-related cirrhosis, previously treated for HepC. On nadolol for varices. INR stable at 1.2. Still has pancytopenia, previously thought to be due to combination of splenic sequestration and liver dysfunction. Cell counts are stable, ANC about 700 today, platelets still in 30s. Will discuss with Dr. Beryle Beams with hematology whether bone marrow biopsy is helpful in this case. Reassuring that she has an appropriate titer after HepB vaccination.   Lenice Pressman, M.D., Ph.D.

## 2018-07-11 ENCOUNTER — Encounter: Payer: Self-pay | Admitting: Internal Medicine

## 2018-07-11 NOTE — Progress Notes (Signed)
Sent patient results with translation to swahili

## 2018-07-22 ENCOUNTER — Encounter: Payer: Self-pay | Admitting: Internal Medicine

## 2018-07-22 ENCOUNTER — Other Ambulatory Visit: Payer: Self-pay

## 2018-07-22 ENCOUNTER — Other Ambulatory Visit (HOSPITAL_COMMUNITY)
Admission: RE | Admit: 2018-07-22 | Discharge: 2018-07-22 | Disposition: A | Discharge status: Home / Self Care | Payer: BLUE CROSS/BLUE SHIELD | Source: Ambulatory Visit | Attending: Internal Medicine | Admitting: Internal Medicine

## 2018-07-22 ENCOUNTER — Ambulatory Visit (INDEPENDENT_AMBULATORY_CARE_PROVIDER_SITE_OTHER): Payer: BLUE CROSS/BLUE SHIELD | Admitting: Internal Medicine

## 2018-07-22 VITALS — BP 132/61 | HR 67 | Temp 97.4°F | Wt 216.3 lb

## 2018-07-22 DIAGNOSIS — Z1272 Encounter for screening for malignant neoplasm of vagina: Secondary | ICD-10-CM | POA: Insufficient documentation

## 2018-07-22 DIAGNOSIS — Z01419 Encounter for gynecological examination (general) (routine) without abnormal findings: Secondary | ICD-10-CM | POA: Insufficient documentation

## 2018-07-22 DIAGNOSIS — D509 Iron deficiency anemia, unspecified: Secondary | ICD-10-CM | POA: Diagnosis not present

## 2018-07-22 DIAGNOSIS — K7460 Unspecified cirrhosis of liver: Secondary | ICD-10-CM | POA: Diagnosis not present

## 2018-07-22 DIAGNOSIS — B1920 Unspecified viral hepatitis C without hepatic coma: Secondary | ICD-10-CM | POA: Diagnosis not present

## 2018-07-22 MED FILL — FUROSEMIDE 40 MG TAB: 40 | 30 days supply | Qty: 30 | Fill #3

## 2018-07-22 NOTE — Assessment & Plan Note (Signed)
The patient prensented to clinic for a pap smear per referral by her pcp. The patient is currently sexually active with only her husband. The patient denies any vaginal discharge or bleeding. She states that she stopped menstrual cycles approximately 3-4 years ago. She has never had a pap smear done previously .  Assessment and plan Pap smear was completed for the patient today. She will be informed of results. Wet prep was not done for the patient as she is only sexually active with her husband, is not symptomatic, and does not have concern for STDs at this time. She had HIV screening 4 years ago which was negative.

## 2018-07-22 NOTE — Progress Notes (Signed)
   CC: Pap smear  HPI:  [name] is a 47 y.o. female with hepatic cirrhosis, hepatitis C virus, iron deficiency anemia who presents for Pap smear.  Past Medical History:  Diagnosis Date  . Hepatitis C virus infection 01/04/2015  . History of malaria    Multiple episodes w/ 2 hospitalizations   Review of Systems:   Denies vaginal discharge or bleeding  Physical Exam:  Vitals:   07/22/18 0921  BP: 132/61  Pulse: 67  Temp: (!) 97.4 F (36.3 C)  TempSrc: Oral  SpO2: 99%  Weight: 216 lb 4.8 oz (98.1 kg)   Physical Exam  Constitutional: She appears well-developed and well-nourished. No distress.  HENT:  Head: Normocephalic and atraumatic.  Eyes: Conjunctivae are normal.  Cardiovascular: Normal rate, regular rhythm and normal heart sounds.  Respiratory: Effort normal and breath sounds normal. No respiratory distress. She has no wheezes.  GI: Soft. Bowel sounds are normal. She exhibits no distension. There is no tenderness.  Genitourinary: Vagina normal. Cervix exhibits discharge.  Genitourinary Comments: Cervical bleeding  Musculoskeletal: She exhibits no edema.  Skin: She is not diaphoretic.  Psychiatric: She has a normal mood and affect. Her behavior is normal. Judgment and thought content normal.   Assessment & Plan:   See Encounters Tab for problem based charting.  Cervical vaginal screening The patient prensented to clinic for a pap smear per referral by her pcp. The patient is currently sexually active with only her husband. The patient denies any vaginal discharge or bleeding. She states that she stopped menstrual cycles approximately 3-4 years ago. She has never had a pap smear done previously .  Assessment and plan Pap smear was completed for the patient today. She will be informed of results. Wet prep was not done for the patient as she is only sexually active with her husband, is not symptomatic, and does not have concern for STDs at this time. She  had HIV screening 4 years ago which was negative.   Patient discussed with Dr. Dareen Piano

## 2018-07-22 NOTE — Patient Instructions (Signed)
It was a pleasure to see you today Katherine Garza. Please make the following changes:  -Your pap smear was done today. We will call you with results.   If you have any questions or concerns, please call our clinic at [phone] between 9am-5pm and after hours call [phone] and ask for the internal medicine resident on call. If you feel you are having a medical emergency please call 911.   Thank you, we look forward to help you remain healthy!  Lars Mage, MD Internal Medicine PGY2

## 2018-07-24 ENCOUNTER — Telehealth: Payer: Self-pay | Admitting: *Deleted

## 2018-07-24 LAB — CYTOLOGY - PAP
Diagnosis: NEGATIVE
HPV: NOT DETECTED

## 2018-07-24 NOTE — Progress Notes (Signed)
Internal Medicine Clinic Attending  Case discussed with Dr. Chundi at the time of the visit.  We reviewed the resident's history and exam and pertinent patient test results.  I agree with the assessment, diagnosis, and plan of care documented in the resident's note. 

## 2018-07-24 NOTE — Telephone Encounter (Signed)
-----   Message from Lars Mage, MD sent at 07/24/2018 12:10 PM EDT ----- Please call patient and inform of negative pap smear results please. I have not been able to reach her. Thank you!   ----- Message ----- From: Interface, Lab In Three Zero One Sent: 07/24/2018   9:08 AM EDT To: Lars Mage, MD

## 2018-07-24 NOTE — Telephone Encounter (Signed)
Spoke w/ daughter Jone Baseman, she states she will take message for mother, informed her of results and ask if pt has questions, she stated no and stated she understands

## 2018-07-29 ENCOUNTER — Telehealth: Payer: Self-pay | Admitting: *Deleted

## 2018-07-29 NOTE — Telephone Encounter (Signed)
-----   Message from Lars Mage, MD sent at 07/29/2018  8:53 AM EDT ----- Morey Hummingbird,  You are doing well had a good weekend!  I have been trying to reach this patient for the past few days and have unfortunately not been able to.  You mind please calling her and informing her that she has a normal Pap smear result.  Thank you so much!  Best,  [employer]

## 2018-07-29 NOTE — Telephone Encounter (Signed)
Lm for rtc 

## 2018-08-29 MED FILL — FUROSEMIDE 40 MG TAB: 40 | 30 days supply | Qty: 30 | Fill #4

## 2018-10-09 MED FILL — FUROSEMIDE 40 MG TAB: 40 | 30 days supply | Qty: 30 | Fill #5

## 2018-11-29 MED FILL — FUROSEMIDE 40 MG TAB: 40 | 30 days supply | Qty: 30 | Fill #6

## 2018-12-26 ENCOUNTER — Other Ambulatory Visit: Payer: Self-pay | Admitting: Gastroenterology

## 2018-12-26 DIAGNOSIS — K7469 Other cirrhosis of liver: Secondary | ICD-10-CM

## 2019-01-01 ENCOUNTER — Other Ambulatory Visit: Payer: Self-pay

## 2019-01-01 ENCOUNTER — Ambulatory Visit
Admission: RE | Admit: 2019-01-01 | Discharge: 2019-01-01 | Disposition: A | Discharge status: Home / Self Care | Payer: BLUE CROSS/BLUE SHIELD | Source: Ambulatory Visit | Attending: Gastroenterology | Admitting: Gastroenterology

## 2019-01-01 DIAGNOSIS — K7469 Other cirrhosis of liver: Secondary | ICD-10-CM

## 2019-01-20 MED FILL — FUROSEMIDE 40 MG TAB: 40 | 30 days supply | Qty: 30 | Fill #7

## 2019-01-26 ENCOUNTER — Emergency Department (HOSPITAL_COMMUNITY): Payer: BLUE CROSS/BLUE SHIELD

## 2019-01-26 ENCOUNTER — Other Ambulatory Visit: Payer: Self-pay

## 2019-01-26 ENCOUNTER — Emergency Department (HOSPITAL_COMMUNITY)
Admission: EM | Admit: 2019-01-26 | Discharge: 2019-01-26 | Disposition: A | Discharge status: Home / Self Care | Payer: BLUE CROSS/BLUE SHIELD | Attending: Emergency Medicine | Admitting: Emergency Medicine

## 2019-01-26 ENCOUNTER — Encounter (HOSPITAL_COMMUNITY): Payer: Self-pay | Admitting: *Deleted

## 2019-01-26 DIAGNOSIS — Z79899 Other long term (current) drug therapy: Secondary | ICD-10-CM | POA: Diagnosis not present

## 2019-01-26 DIAGNOSIS — R059 Cough, unspecified: Secondary | ICD-10-CM

## 2019-01-26 DIAGNOSIS — J101 Influenza due to other identified influenza virus with other respiratory manifestations: Secondary | ICD-10-CM | POA: Diagnosis not present

## 2019-01-26 DIAGNOSIS — R05 Cough: Secondary | ICD-10-CM | POA: Diagnosis present

## 2019-01-26 DIAGNOSIS — J069 Acute upper respiratory infection, unspecified: Secondary | ICD-10-CM | POA: Insufficient documentation

## 2019-01-26 DIAGNOSIS — R600 Localized edema: Secondary | ICD-10-CM | POA: Diagnosis not present

## 2019-01-26 LAB — COMPREHENSIVE METABOLIC PANEL
ALT: 20 U/L (ref 0–44)
ANION GAP: 12 (ref 5–15)
AST: 18 U/L (ref 15–41)
Albumin: 3.7 g/dL (ref 3.5–5.0)
Alkaline Phosphatase: 58 U/L (ref 38–126)
BUN: 9 mg/dL (ref 6–20)
CO2: 25 mmol/L (ref 22–32)
Calcium: 9.1 mg/dL (ref 8.9–10.3)
Chloride: 99 mmol/L (ref 98–111)
Creatinine, Ser: 0.86 mg/dL (ref 0.44–1.00)
GFR calc Af Amer: >60 mL/min (ref >60)
GFR calc non Af Amer: >60 mL/min (ref >60)
Glucose, Bld: 372 mg/dL — ABNORMAL HIGH (ref 70–99)
Potassium: 3.5 mmol/L (ref 3.5–5.1)
Sodium: 136 mmol/L (ref 135–145)
Total Bilirubin: 1.1 mg/dL (ref 0.3–1.2)
Total Protein: 7.4 g/dL (ref 6.5–8.1)

## 2019-01-26 LAB — URINALYSIS, ROUTINE W REFLEX MICROSCOPIC
BILIRUBIN URINE: NEGATIVE
Bacteria, UA: NONE SEEN
Glucose, UA: >=500 mg/dL — AB
Hgb urine dipstick: NEGATIVE
Ketones, ur: NEGATIVE mg/dL
LEUKOCYTE UA: NEGATIVE
NITRITE: NEGATIVE
Protein, ur: NEGATIVE mg/dL
Specific Gravity, Urine: 1.021 (ref 1.005–1.030)
pH: 7 (ref 5.0–8.0)

## 2019-01-26 LAB — CBC WITH DIFFERENTIAL/PLATELET
Abs Immature Granulocytes: 0.01 10*3/uL (ref 0.00–0.07)
Basophils Absolute: 0 10*3/uL (ref 0.0–0.1)
Basophils Relative: 0 %
Eosinophils Absolute: 0 10*3/uL (ref 0.0–0.5)
Eosinophils Relative: 1 %
HEMATOCRIT: 36.8 % (ref 36.0–46.0)
Hemoglobin: 12.2 g/dL (ref 12.0–15.0)
Immature Granulocytes: 1 %
LYMPHS PCT: 22 %
Lymphs Abs: 0.4 10*3/uL — ABNORMAL LOW (ref 0.7–4.0)
MCH: 26 pg (ref 26.0–34.0)
MCHC: 33.2 g/dL (ref 30.0–36.0)
MCV: 78.3 fL — ABNORMAL LOW (ref 80.0–100.0)
Monocytes Absolute: 0.2 10*3/uL (ref 0.1–1.0)
Monocytes Relative: 10 %
Neutro Abs: 1.1 10*3/uL — ABNORMAL LOW (ref 1.7–7.7)
Neutrophils Relative %: 66 %
Platelets: 36 10*3/uL — ABNORMAL LOW (ref 150–400)
RBC: 4.7 MIL/uL (ref 3.87–5.11)
RDW: 13.1 % (ref 11.5–15.5)
WBC: 1.7 10*3/uL — ABNORMAL LOW (ref 4.0–10.5)
nRBC: 0 % (ref 0.0–0.2)

## 2019-01-26 LAB — INFLUENZA PANEL BY PCR (TYPE A & B)
Influenza A By PCR: POSITIVE — AB
Influenza B By PCR: NEGATIVE

## 2019-01-26 LAB — BRAIN NATRIURETIC PEPTIDE: B Natriuretic Peptide: 31.4 pg/mL (ref 0.0–100.0)

## 2019-01-26 LAB — LIPASE, BLOOD: Lipase: 26 U/L (ref 11–51)

## 2019-01-26 MED ORDER — OSELTAMIVIR PHOSPHATE 75 MG PO CAPS: 75.0000 mg | ORAL_CAPSULE | Freq: Two times a day (BID) | ORAL | 0 refills | Status: DC

## 2019-01-26 MED ORDER — SODIUM CHLORIDE 0.9 % IV BOLUS
1000.0000 mL | Freq: Once | INTRAVENOUS | Status: AC
  Administered 2019-01-26: 1000 mL via INTRAVENOUS

## 2019-01-26 MED ORDER — ACETAMINOPHEN 325 MG PO TABS
650.0000 mg | ORAL_TABLET | Freq: Once | ORAL | Status: AC
  Administered 2019-01-26: 650 mg via ORAL
  Filled 2019-01-26: qty 2

## 2019-01-26 MED ORDER — OSELTAMIVIR PHOSPHATE 75 MG PO CAPS
75.0000 mg | ORAL_CAPSULE | Freq: Once | ORAL | Status: AC
  Administered 2019-01-26: 75 mg via ORAL
  Filled 2019-01-26: qty 1

## 2019-01-26 NOTE — ED Triage Notes (Signed)
PT arrived from home with cough , no fever on admission

## 2019-01-26 NOTE — ED Triage Notes (Signed)
Pt requested to go to BR to void.  Pt was instructed to collect urine sample in cup. Daughter also explained to Pt to collect urine sample. While in BR this writer opened  The peri wipes for PT and Pt reported understanding to collect urine. Pt sat on toilet and started to void and would not collect sample.  Pt returned to room and explained Dr wants a urine sample and to call when needing to go.

## 2019-01-26 NOTE — ED Provider Notes (Signed)
[facility] EMERGENCY DEPARTMENT Provider Note   CSN: [id number] Arrival date & time: 01/26/19  8416     History   Chief Complaint Chief Complaint  Patient presents with  . Influenza    HPI Katherine Garza is a 48 y.o. female.   Influenza  Presenting symptoms: cough, fatigue, fever, myalgias and rhinorrhea   Presenting symptoms: no diarrhea, no headaches, no nausea, no shortness of breath, no sore throat and no vomiting   Severity:  Moderate Onset quality:  Gradual Duration:  2 days Progression:  Worsening Chronicity:  New Relieved by:  Nothing Worsened by:  Nothing Ineffective treatments:  None tried Associated symptoms: chills and nasal congestion   Associated symptoms: no mental status change, no neck stiffness and no syncope     Past Medical History:  Diagnosis Date  . Hepatitis C virus infection 01/04/2015  . History of malaria    Multiple episodes w/ 2 hospitalizations    Patient Active Problem List   Diagnosis Date Noted  . Smear, vaginal, as part of routine gynecological examination 07/22/2018  . Iron deficiency anemia 02/02/2017  . Bilateral lower extremity edema 01/05/2017  . Health care maintenance 04/06/2015  . Hepatitis C virus infection 01/04/2015  . Splenomegaly 11/27/2014  . Pancytopenia ([place]) 11/27/2014  . Hepatic cirrhosis ([place]) 11/27/2014  . GERD (gastroesophageal reflux disease) 11/27/2014    No past surgical history on file.   OB History   No obstetric history on file.      Home Medications    Prior to Admission medications   Medication Sig Start Date End Date Taking? Authorizing Provider  furosemide (LASIX) 40 MG tablet Take 1 tablet (40 mg total) by mouth daily. 04/08/18 04/08/19  Velna Ochs, MD  nadolol (CORGARD) 40 MG tablet Take 1 tablet (40 mg total) by mouth daily. 02/02/17   Florinda Marker, MD    Family History No family history on file.  Social History Social History   Tobacco Use  .  Smoking status: Never Smoker  . Smokeless tobacco: Never Used  Substance Use Topics  . Alcohol use: No    Alcohol/week: 0.0 standard drinks  . Drug use: No     Allergies   Patient has no known allergies.   Review of Systems Review of Systems  Constitutional: Positive for chills, fatigue and fever. Negative for diaphoresis.  HENT: Positive for congestion and rhinorrhea. Negative for sore throat.   Eyes: Negative for visual disturbance.  Respiratory: Positive for cough. Negative for chest tightness, shortness of breath, wheezing and stridor.   Cardiovascular: Negative for chest pain, palpitations and leg swelling.  Gastrointestinal: Negative for abdominal pain, constipation, diarrhea, nausea and vomiting.  Genitourinary: Positive for frequency. Negative for dysuria.  Musculoskeletal: Positive for myalgias. Negative for back pain, gait problem and neck stiffness.  Skin: Negative for rash and wound.  Neurological: Negative for syncope, weakness, light-headedness, numbness and headaches.  Psychiatric/Behavioral: Negative for agitation and confusion.  All other systems reviewed and are negative.    Physical Exam Updated Vital Signs BP (!) 122/54 (BP Location: Left Arm)   Pulse 80   Temp (!) 101.5 F (38.6 C) (Rectal)   Resp 16   Ht 5\' 3"  (1.6 m)   SpO2 98%   BMI 38.32 kg/m   Physical Exam Vitals signs and nursing note reviewed.  Constitutional:      General: She is not in acute distress.    Appearance: She is well-developed. She is not ill-appearing, toxic-appearing or diaphoretic.  HENT:     Head: Normocephalic and atraumatic.     Right Ear: External ear normal.     Left Ear: External ear normal.     Nose: Congestion and rhinorrhea present.     Mouth/Throat:     Mouth: Mucous membranes are dry.     Pharynx: No oropharyngeal exudate or posterior oropharyngeal erythema.  Eyes:     Extraocular Movements: Extraocular movements intact.     Conjunctiva/sclera:  Conjunctivae normal.     Pupils: Pupils are equal, round, and reactive to light.  Neck:     Musculoskeletal: Normal range of motion and neck supple. No muscular tenderness.  Cardiovascular:     Rate and Rhythm: Normal rate.     Pulses: Normal pulses.     Heart sounds: No murmur.  Pulmonary:     Effort: No respiratory distress.     Breath sounds: No stridor. No wheezing, rhonchi or rales.  Chest:     Chest wall: No tenderness.  Abdominal:     General: Abdomen is flat. There is no distension.     Tenderness: There is no abdominal tenderness. There is no rebound.  Musculoskeletal:        General: No tenderness.     Right lower leg: Edema present.     Left lower leg: Edema present.  Skin:    General: Skin is warm.     Findings: No erythema or rash.  Neurological:     General: No focal deficit present.     Mental Status: She is alert and oriented to person, place, and time.     Sensory: No sensory deficit.     Motor: No weakness or abnormal muscle tone.     Coordination: Coordination normal.     Deep Tendon Reflexes: Reflexes are normal and symmetric.  Psychiatric:        Mood and Affect: Mood normal.      ED Treatments / Results  Labs (all labs ordered are listed, but only abnormal results are displayed) Labs Reviewed  CBC WITH DIFFERENTIAL/PLATELET - Abnormal; Notable for the following components:      Result Value   WBC 1.7 (*)    MCV 78.3 (*)    Platelets 36 (*)    Neutro Abs 1.1 (*)    Lymphs Abs 0.4 (*)    All other components within normal limits  COMPREHENSIVE METABOLIC PANEL - Abnormal; Notable for the following components:   Glucose, Bld 372 (*)    All other components within normal limits  URINALYSIS, ROUTINE W REFLEX MICROSCOPIC - Abnormal; Notable for the following components:   Glucose, UA >=500 (*)    All other components within normal limits  INFLUENZA PANEL BY PCR (TYPE A & B) - Abnormal; Notable for the following components:   Influenza A By PCR  POSITIVE (*)    All other components within normal limits  URINE CULTURE  CULTURE, BLOOD (ROUTINE X 2)  CULTURE, BLOOD (ROUTINE X 2)  LIPASE, BLOOD  BRAIN NATRIURETIC PEPTIDE    EKG EKG Interpretation  Date/Time:  Sunday January 26 2019 08:32:10 EST Ventricular Rate:  79 PR Interval:    QRS Duration: 99 QT Interval:  383 QTC Calculation: 439 R Axis:   -6 Text Interpretation:  Sinus rhythm Left atrial enlargement Abnormal R-wave progression, early transition Left ventricular hypertrophy Anterior ST elevation, probably due to LVH no prior ECG for comparison.  No STEMI Confirmed by Antony Blackbird [phone]) on 01/26/2019 9:17:34 AM Also confirmed by Luke Falero,  Gerald Stabs [phone]), editor Radene Gunning [phone])  on 01/26/2019 12:35:45 PM   Radiology Dg Chest 2 View  Result Date: 01/26/2019 CLINICAL DATA:  Fever and cough EXAM: CHEST - 2 VIEW COMPARISON:  None. FINDINGS: Lungs are clear. Heart size and pulmonary vascularity are normal. No adenopathy. No pneumothorax. No bone lesions. IMPRESSION: No edema or consolidation. Electronically Signed   By: Lowella Grip III M.D.   On: 01/26/2019 09:19    Procedures Procedures (including critical care time)  Medications Ordered in ED Medications  sodium chloride 0.9 % bolus 1,000 mL (0 mLs Intravenous Stopped 01/26/19 1310)  acetaminophen (TYLENOL) tablet 650 mg (650 mg Oral Given 01/26/19 1445)  oseltamivir (TAMIFLU) capsule 75 mg (75 mg Oral Given 01/26/19 1534)     Initial Impression / Assessment and Plan / ED Course  I have reviewed the triage vital signs and the nursing notes.  Pertinent labs & imaging results that were available during my care of the patient were reviewed by me and considered in my medical decision making (see chart for details).     Katherine Garza is a 48 y.o. female with a past medical history significant for hepatitis C cirrhosis, prior malaria, iron deficiency anemia, and chronic anemia on Lasix who presents with  fevers, chills, congestion, nonproductive cough, fatigue, myalgias, and decreased appetite.  Patient is coming by family.  Patient says that she has been feeling bad since yesterday.  She is unsure of sick contacts.  Denies any headache, neck pain, neck stiffness.  Denies any neurologic implants visible, no numbness, tingling, weakness of extremities vision changes.  Patient's daughter says that she has not been eating or drinking as much.  Patient denies any nausea or vomiting constipation or diarrhea.  She reports that she has had increasing urination.  Denies any dysuria.  No recent trauma.  No other complaints on arrival.  On exam, patient was found to be warm to the touch.  Rectal temperature was performed and was found to be 1-1.5.  Patient was not tachycardic or hypotensive.  Patient's lungs had some crackles in the bases.  Patient had edema in both legs.  Patient had palpable pulses in extremities.  No murmur.  Chest and abdomen nontender.  Patient had some audible congestion visible rhinorrhea.  Clinical aspect patient has URI versus pneumonia versus influenza.  Patient will have work-up to look for UTI given the reported urinary frequency and will have chest x-ray and labs.  Anticipate reassessment after work-up.  Given history of cirrhosis, will hold on Tylenol for her fever until LFTs are back.  If work-up is reassuring, anticipate discharge.     Work-up revealed influenza.  Suspect is the cause of symptoms.  No evidence of bacterial infection.  Patient given dose of Tamiflu.  Patient felt much better after Tylenol and fluids.  Patient give prescription for Tamiflu and follow-up with PCP.  Patient was understanding of the plan of care and was discharged in good condition with improved symptoms and improved vitals.    Final Clinical Impressions(s) / ED Diagnoses   Final diagnoses:  Influenza A  Upper respiratory tract infection, unspecified type  Cough    ED Discharge Orders          Ordered    oseltamivir (TAMIFLU) 75 MG capsule  Every 12 hours     01/26/19 1515          Clinical Impression: 1. Influenza A   2. Upper respiratory tract infection, unspecified type   3. Cough  Disposition: Discharge  Condition: Good  I have discussed the results, Dx and Tx plan with the pt(& family if present). He/she/they expressed understanding and agree(s) with the plan. Discharge instructions discussed at great length. Strict return precautions discussed and pt &/or family have verbalized understanding of the instructions. No further questions at time of discharge.    Discharge Medication List as of 01/26/2019  3:15 PM    START taking these medications   Details  oseltamivir (TAMIFLU) 75 MG capsule Take 1 capsule (75 mg total) by mouth every 12 (twelve) hours., Starting Sun 01/26/2019, Print        Follow Up: [facility] [address] [place] [address] [phone] Schedule an appointment as soon as possible for a visit    [facility] [address] [place]       Xiong Haidar, Gwenyth Allegra, MD 01/26/19 1655

## 2019-01-26 NOTE — Discharge Instructions (Signed)
Your work-up today revealed influenza.  This is likely the cause of all of your symptoms.  Please rest and stay hydrated and take the Tamiflu medication for the next 5 days.  Please follow-up with your PCP.  Please rest.  If any symptoms change or worsen, please return to the nearest emergency department.

## 2019-01-26 NOTE — ED Notes (Signed)
Pt given apple juice to drink for PO challenge.

## 2019-01-27 LAB — URINE CULTURE: Culture: <10000 — AB

## 2019-01-31 LAB — CULTURE, BLOOD (ROUTINE X 2)
Culture: NO GROWTH
Culture: NO GROWTH
Special Requests: ADEQUATE

## 2019-02-04 ENCOUNTER — Ambulatory Visit: Payer: BLUE CROSS/BLUE SHIELD | Admitting: Internal Medicine

## 2019-02-04 ENCOUNTER — Other Ambulatory Visit: Payer: Self-pay

## 2019-02-04 DIAGNOSIS — J101 Influenza due to other identified influenza virus with other respiratory manifestations: Secondary | ICD-10-CM | POA: Diagnosis not present

## 2019-02-04 DIAGNOSIS — K7460 Unspecified cirrhosis of liver: Secondary | ICD-10-CM | POA: Diagnosis not present

## 2019-02-04 DIAGNOSIS — B1920 Unspecified viral hepatitis C without hepatic coma: Secondary | ICD-10-CM

## 2019-02-04 MED ORDER — CEFDINIR 300 MG PO CAPS: 300.0000 mg | ORAL_CAPSULE | Freq: Two times a day (BID) | ORAL | 0 refills | Status: DC

## 2019-02-04 MED ORDER — AZITHROMYCIN 250 MG PO TABS: ORAL_TABLET | ORAL | 0 refills | Status: DC

## 2019-02-04 NOTE — Patient Instructions (Signed)
Ms. Dehaas,   We are concerned you may have a pneumonia.  You will need antibiotics for this.  I sent a prescription for azithromycin and cefdinir.  You will take this medications for 5 days.  We also order chest x-ray.  You can stop by the radiology department at anytime between 8 AM and 5 PM to have this done.  Call us if you have any questions or concerns or if symptoms get worse.  -Dr. Frederico Hamman

## 2019-02-05 ENCOUNTER — Encounter: Payer: Self-pay | Admitting: Internal Medicine

## 2019-02-05 NOTE — Progress Notes (Signed)
Internal Medicine Clinic Attending  Case discussed with Dr. Santos-Sanchez at the time of the visit.  We reviewed the resident's history and exam and pertinent patient test results.  I agree with the assessment, diagnosis, and plan of care documented in the resident's note.    

## 2019-02-05 NOTE — Progress Notes (Signed)
   CC: Influenza A follow up  HPI:  Katherine Garza is a 48 y.o. year-old female with PMH listed below who presents to clinic for influenza A follow up. Please see problem based assessment and plan for further details.   Past Medical History:  Diagnosis Date  . Hepatitis C virus infection 01/04/2015  . History of malaria    Multiple episodes w/ 2 hospitalizations   Review of Systems:   Review of Systems  Constitutional: Positive for malaise/fatigue. Negative for chills and fever.  Respiratory: Positive for cough, sputum production and shortness of breath.   Cardiovascular: Negative for chest pain.  Musculoskeletal: Positive for myalgias.  Neurological: Positive for headaches. Negative for dizziness.    Physical Exam: Vitals:   02/04/19 0859  BP: 113/67  Pulse: 65  Temp: 98.1 F (36.7 C)  TempSrc: Oral  SpO2: 100%  Weight: 204 lb 1.6 oz (92.6 kg)  Height: 5\' 2"  (1.575 m)   General: tired appearing female in no acute distress  Cardiac: regular rate and rhythm, nl S1/S2, no murmurs, rubs or gallops  Pulm: decreased breath sounds at R lower base, no wheezes or crackles, no increased WOB, oxygenating well on room air     Assessment & Plan:   See Encounters Tab for problem based charting.  Patient discussed with Dr. Evette Doffing

## 2019-02-05 NOTE — Assessment & Plan Note (Addendum)
Katherine Garza was seen at an Urgent Care on 2/16 for flu-like symptoms and was found to have Influenza A. She was prescribed oseltamivir 75 mg BID x 5 days which she has completed. Her symptoms started to improve within a few days of onset, but states that for the past 2 days her cough has worsened and is now productive of dark sputum. She also endorses shortness of breath that is worse on exertion. On exam, she is afebrile and non-tachypneic. She does appear tired and is coughing constantly through the exam. She has decreased breath sounds on the R lower base.   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 Ordered CXR to rule out influenza. Cefdinir and azithromycin for CAP coverage. Low suspicion for MRSA pneumonia at this time. Return precautions discussed.

## 2019-04-25 IMAGING — CR DG CHEST 2V
2 series · 2 of 2 positions shown · non-contrast
Comparison: None.

CLINICAL DATA: Fever and cough

EXAM:
CHEST - 2 VIEW

[chest pa]
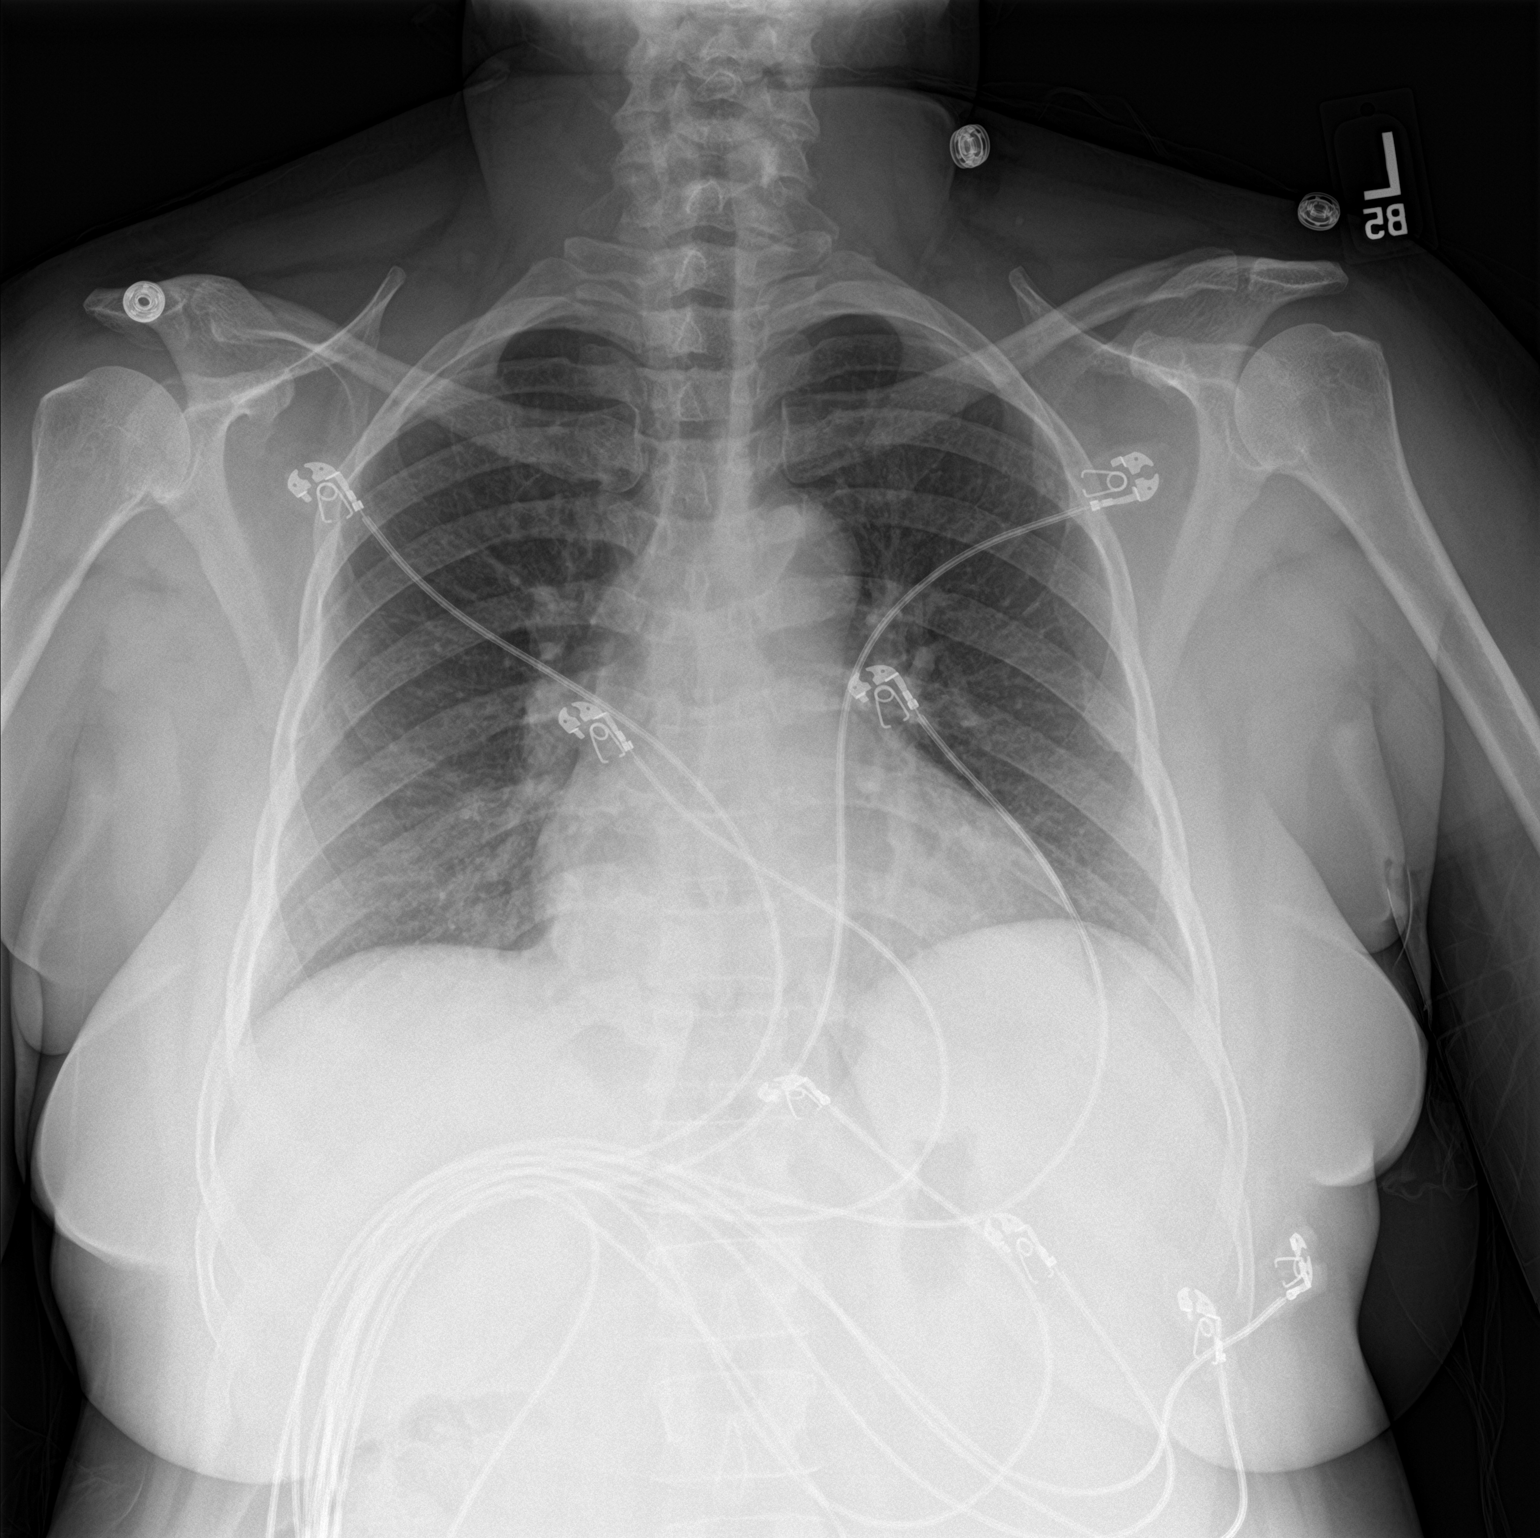

[chest lat]
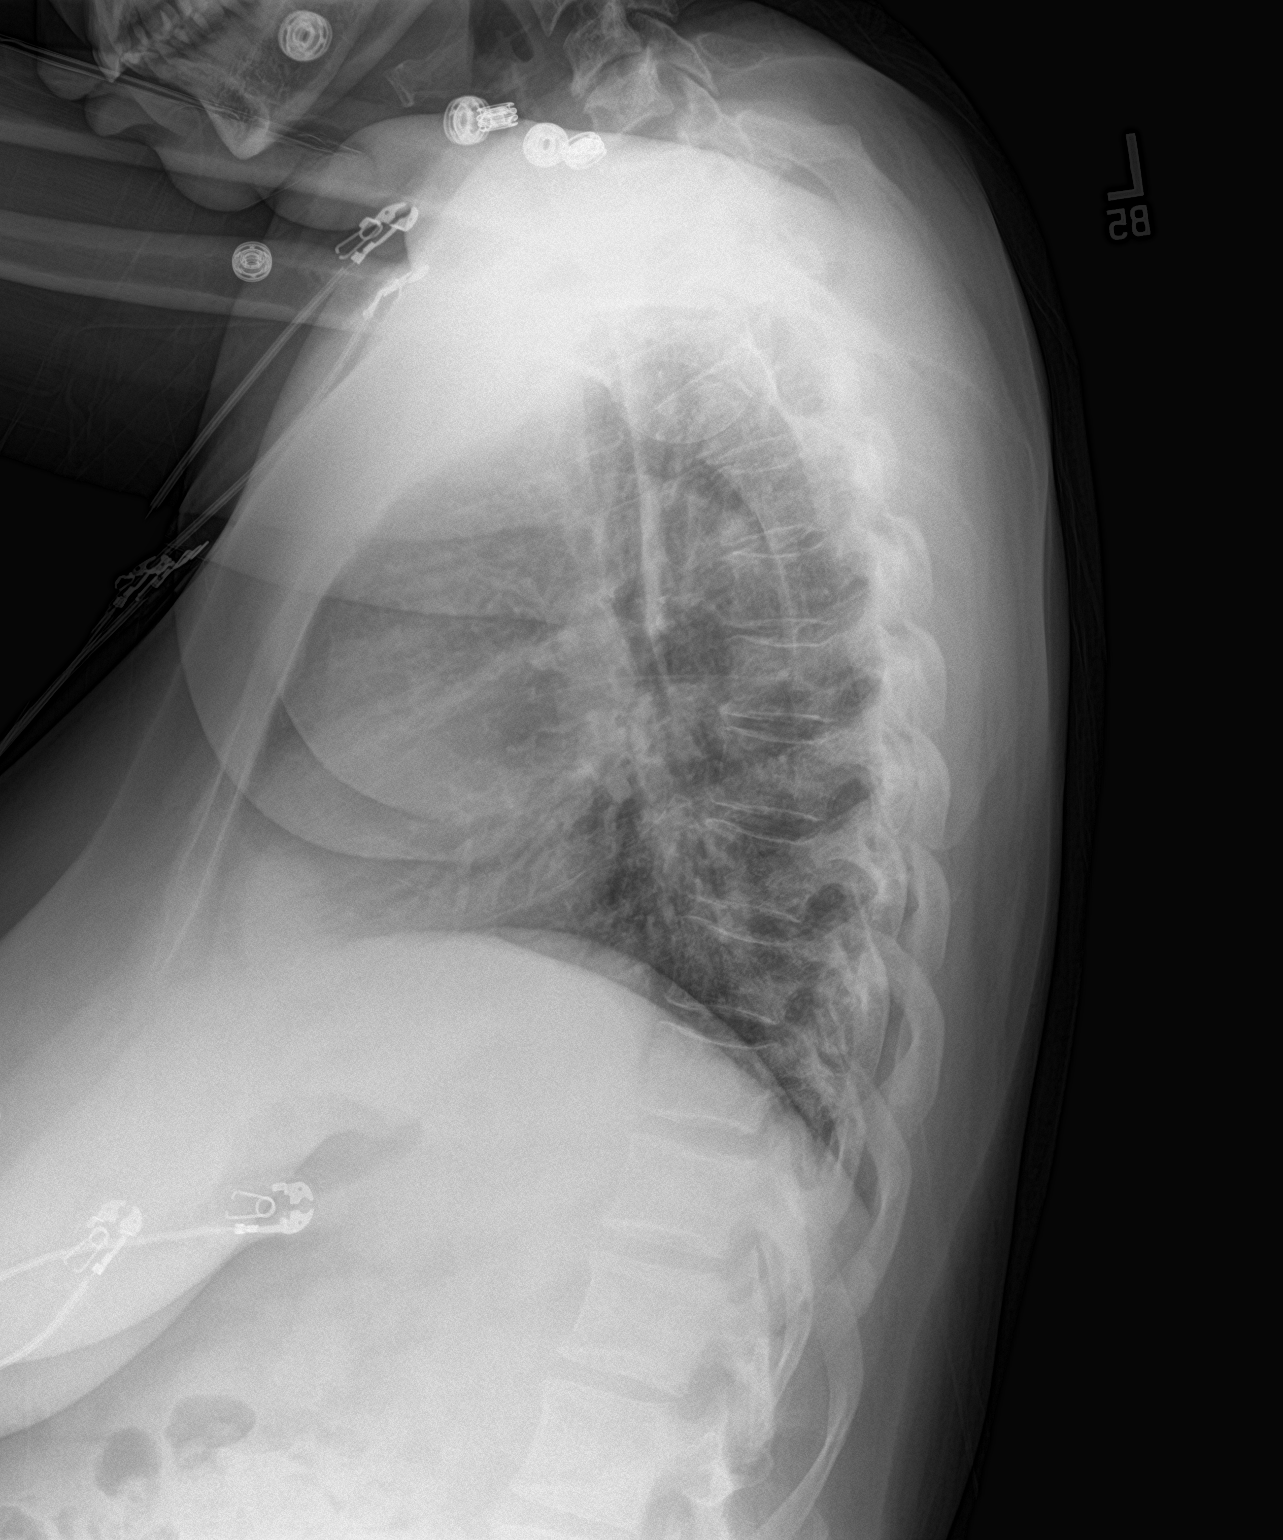

[2 of 2 positions shown; findings below may reference images not displayed]

FINDINGS: Lungs are clear. Heart size and pulmonary vascularity are normal. No
adenopathy. No pneumothorax. No bone lesions.
IMPRESSION: No edema or consolidation.

## 2019-06-28 ENCOUNTER — Other Ambulatory Visit: Payer: Self-pay | Admitting: Gastroenterology

## 2019-06-28 DIAGNOSIS — K7469 Other cirrhosis of liver: Secondary | ICD-10-CM

## 2019-07-08 ENCOUNTER — Ambulatory Visit
Admission: RE | Admit: 2019-07-08 | Discharge: 2019-07-08 | Disposition: A | Discharge status: Home / Self Care | Payer: BC Managed Care – PPO | Source: Ambulatory Visit | Attending: Gastroenterology | Admitting: Gastroenterology

## 2019-07-08 DIAGNOSIS — K7469 Other cirrhosis of liver: Secondary | ICD-10-CM

## 2019-11-25 ENCOUNTER — Emergency Department (HOSPITAL_COMMUNITY)
Admission: EM | Admit: 2019-11-25 | Discharge: 2019-11-25 | Disposition: A | Discharge status: Home / Self Care | Payer: BC Managed Care – PPO | Attending: Emergency Medicine | Admitting: Emergency Medicine

## 2019-11-25 ENCOUNTER — Emergency Department (HOSPITAL_COMMUNITY): Payer: BC Managed Care – PPO

## 2019-11-25 ENCOUNTER — Encounter (HOSPITAL_COMMUNITY): Payer: Self-pay

## 2019-11-25 ENCOUNTER — Other Ambulatory Visit: Payer: Self-pay

## 2019-11-25 DIAGNOSIS — S91302A Unspecified open wound, left foot, initial encounter: Secondary | ICD-10-CM | POA: Insufficient documentation

## 2019-11-25 DIAGNOSIS — Y929 Unspecified place or not applicable: Secondary | ICD-10-CM | POA: Diagnosis not present

## 2019-11-25 DIAGNOSIS — B182 Chronic viral hepatitis C: Secondary | ICD-10-CM | POA: Insufficient documentation

## 2019-11-25 DIAGNOSIS — Y999 Unspecified external cause status: Secondary | ICD-10-CM | POA: Diagnosis not present

## 2019-11-25 DIAGNOSIS — I10 Essential (primary) hypertension: Secondary | ICD-10-CM | POA: Insufficient documentation

## 2019-11-25 DIAGNOSIS — Y939 Activity, unspecified: Secondary | ICD-10-CM | POA: Insufficient documentation

## 2019-11-25 DIAGNOSIS — X58XXXA Exposure to other specified factors, initial encounter: Secondary | ICD-10-CM | POA: Insufficient documentation

## 2019-11-25 DIAGNOSIS — Z8613 Personal history of malaria: Secondary | ICD-10-CM | POA: Insufficient documentation

## 2019-11-25 HISTORY — DX: Essential (primary) hypertension: I10

## 2019-11-25 LAB — CBC WITH DIFFERENTIAL/PLATELET
Abs Immature Granulocytes: 0 10*3/uL (ref 0.00–0.07)
Basophils Absolute: 0 10*3/uL (ref 0.0–0.1)
Basophils Relative: 1 %
Eosinophils Absolute: 0 10*3/uL (ref 0.0–0.5)
Eosinophils Relative: 1 %
HCT: 35.1 % — ABNORMAL LOW (ref 36.0–46.0)
Hemoglobin: 11.3 g/dL — ABNORMAL LOW (ref 12.0–15.0)
Immature Granulocytes: 0 %
Lymphocytes Relative: 50 %
Lymphs Abs: 0.7 10*3/uL (ref 0.7–4.0)
MCH: 25.9 pg — ABNORMAL LOW (ref 26.0–34.0)
MCHC: 32.2 g/dL (ref 30.0–36.0)
MCV: 80.3 fL (ref 80.0–100.0)
Monocytes Absolute: 0.1 10*3/uL (ref 0.1–1.0)
Monocytes Relative: 8 %
Neutro Abs: 0.6 10*3/uL — ABNORMAL LOW (ref 1.7–7.7)
Neutrophils Relative %: 40 %
Platelets: 41 10*3/uL — ABNORMAL LOW (ref 150–400)
RBC: 4.37 MIL/uL (ref 3.87–5.11)
RDW: 13.7 % (ref 11.5–15.5)
WBC: 1.4 10*3/uL — CL (ref 4.0–10.5)
nRBC: 0 % (ref 0.0–0.2)

## 2019-11-25 LAB — COMPREHENSIVE METABOLIC PANEL
ALT: 20 U/L (ref 0–44)
AST: 21 U/L (ref 15–41)
Albumin: 4.1 g/dL (ref 3.5–5.0)
Alkaline Phosphatase: 53 U/L (ref 38–126)
Anion gap: 8 (ref 5–15)
BUN: 12 mg/dL (ref 6–20)
CO2: 28 mmol/L (ref 22–32)
Calcium: 9.4 mg/dL (ref 8.9–10.3)
Chloride: 103 mmol/L (ref 98–111)
Creatinine, Ser: 0.66 mg/dL (ref 0.44–1.00)
GFR calc Af Amer: >60 mL/min (ref >60)
GFR calc non Af Amer: >60 mL/min (ref >60)
Glucose, Bld: 177 mg/dL — ABNORMAL HIGH (ref 70–99)
Potassium: 3.3 mmol/L — ABNORMAL LOW (ref 3.5–5.1)
Sodium: 139 mmol/L (ref 135–145)
Total Bilirubin: 0.8 mg/dL (ref 0.3–1.2)
Total Protein: 7.9 g/dL (ref 6.5–8.1)

## 2019-11-25 MED ORDER — CEPHALEXIN 500 MG PO CAPS: 500.0000 mg | ORAL_CAPSULE | Freq: Four times a day (QID) | ORAL | 0 refills | Status: DC

## 2019-11-25 MED FILL — CEPHALEXIN 500 MG CAPSULE: 500 | 7 days supply | Qty: 28 | Fill #0

## 2019-11-25 NOTE — Discharge Planning (Signed)
Alycea Segoviano J. Clydene Laming, RN, BSN, [facility] set up appointment with [facility] Internal Medicine on 12/17 @ 1:15 and [facility] 1/5 @ 0900.  Spoke with pt at bedside and advised to please arrive 15 min early and take a picture ID and your current medications.  Pt verbalizes understanding of keeping appointment.

## 2019-11-25 NOTE — ED Provider Notes (Signed)
[facility] EMERGENCY DEPARTMENT Provider Note   CSN: [id number] Arrival date & time: 11/25/19  [id number]     History No chief complaint on file.   Katherine Garza is a 48 y.o. female.  HPI Patient presents to the emergency department with foot pain with a small open wound to the left medial foot.  Patient has had this ongoing for several weeks.  Patient believes it is related to shoes that she has been wearing.  Is giving this information to her daughter who is interpreting.  Patient has had no other symptoms such as fever, nausea, vomiting, weakness, dizziness, pain, shortness of breath, neck pain or syncope.    Past Medical History:  Diagnosis Date  . Hepatitis C virus infection 01/04/2015  . History of malaria    Multiple episodes w/ 2 hospitalizations  . Hypertension     Patient Active Problem List   Diagnosis Date Noted  . Influenza A 02/04/2019  . Smear, vaginal, as part of routine gynecological examination 07/22/2018  . Iron deficiency anemia 02/02/2017  . Bilateral lower extremity edema 01/05/2017  . Health care maintenance 04/06/2015  . Hepatitis C virus infection 01/04/2015  . Splenomegaly 11/27/2014  . Pancytopenia ([place]) 11/27/2014  . Hepatic cirrhosis ([place]) 11/27/2014  . GERD (gastroesophageal reflux disease) 11/27/2014    History reviewed. No pertinent surgical history.   OB History   No obstetric history on file.     No family history on file.  Social History   Tobacco Use  . Smoking status: Never Smoker  . Smokeless tobacco: Never Used  Substance Use Topics  . Alcohol use: No    Alcohol/week: 0.0 standard drinks  . Drug use: No    Home Medications Prior to Admission medications   Medication Sig Start Date End Date Taking? Authorizing Provider  azithromycin (ZITHROMAX) 250 MG tablet Take 2 tablets on Day 1and then 1 tablet every day for 4 days. Patient not taking: Reported on 11/25/2019 02/04/19   Welford Roche,  MD  cefdinir (OMNICEF) 300 MG capsule Take 1 capsule (300 mg total) by mouth 2 (two) times daily. Patient not taking: Reported on 11/25/2019 02/04/19   Welford Roche, MD  cephALEXin (KEFLEX) 500 MG capsule Take 1 capsule (500 mg total) by mouth 4 (four) times daily. 11/25/19   Lynna Zamorano, Harrell Gave, PA-C  nadolol (CORGARD) 40 MG tablet Take 1 tablet (40 mg total) by mouth daily. Patient not taking: Reported on 11/25/2019 02/02/17   Florinda Marker, MD  oseltamivir (TAMIFLU) 75 MG capsule Take 1 capsule (75 mg total) by mouth every 12 (twelve) hours. Patient not taking: Reported on 11/25/2019 01/26/19   Tegeler, Gwenyth Allegra, MD    Allergies    Patient has no known allergies.  Review of Systems   Review of Systems All other systems negative except as documented in the HPI. All pertinent positives and negatives as reviewed in the HPI.  Physical Exam Updated Vital Signs BP (!) 144/94 (BP Location: Left Arm)   Pulse 78   Temp 97.6 F (36.4 C) (Oral)   Resp 16   SpO2 98%   Physical Exam Vitals and nursing note reviewed.  Constitutional:      General: She is not in acute distress.    Appearance: She is well-developed.  HENT:     Head: Normocephalic and atraumatic.  Eyes:     Pupils: Pupils are equal, round, and reactive to light.  Cardiovascular:     Rate and Rhythm: Normal rate and regular  rhythm.     Heart sounds: Normal heart sounds. No murmur. No friction rub. No gallop.   Pulmonary:     Effort: Pulmonary effort is normal. No respiratory distress.     Breath sounds: Normal breath sounds. No wheezing.  Abdominal:     General: Bowel sounds are normal. There is no distension.     Palpations: Abdomen is soft.     Tenderness: There is no abdominal tenderness.  Musculoskeletal:     Cervical back: Normal range of motion and neck supple.       Feet:  Skin:    General: Skin is warm and dry.     Capillary Refill: Capillary refill takes less than 2 seconds.     Findings: No  erythema or rash.  Neurological:     Mental Status: She is alert and oriented to person, place, and time.     Motor: No abnormal muscle tone.     Coordination: Coordination normal.  Psychiatric:        Behavior: Behavior normal.     ED Results / Procedures / Treatments   Labs (all labs ordered are listed, but only abnormal results are displayed) Labs Reviewed  COMPREHENSIVE METABOLIC PANEL - Abnormal; Notable for the following components:      Result Value   Potassium 3.3 (*)    Glucose, Bld 177 (*)    All other components within normal limits  CBC WITH DIFFERENTIAL/PLATELET - Abnormal; Notable for the following components:   WBC 1.4 (*)    Hemoglobin 11.3 (*)    HCT 35.1 (*)    MCH 25.9 (*)    Platelets 41 (*)    Neutro Abs 0.6 (*)    All other components within normal limits  PATHOLOGIST SMEAR REVIEW    EKG None  Radiology DG Foot Complete Left  Result Date: 11/25/2019 CLINICAL DATA:  Open sore left great toe. EXAM: LEFT FOOT - COMPLETE 3+ VIEW COMPARISON:  No prior. FINDINGS: Soft tissue deformity noted over the base of the left great toe. No radiopaque foreign body. No underlying bony or joint abnormality identified. No acute bony or joint abnormality identified. IMPRESSION: 1. Soft tissue deformity noted at the base of the left great toe. No radiopaque foreign body. 2.  No acute or focal bony or joint abnormality identified. Electronically Signed   By: Marcello Moores  Register   On: 11/25/2019 13:00    Procedures Procedures (including critical care time)  Medications Ordered in ED Medications - No data to display  ED Course  I have reviewed the triage vital signs and the nursing notes.  Pertinent labs & imaging results that were available during my care of the patient were reviewed by me and considered in my medical decision making (see chart for details).  Clinical Course as of Nov 24 1525  Tue Dec 15, 129  6850 48 year old female here with left foot pain and  growth.  No known trauma but possibly related to shoes.  She is got a tender exophytic growth over the medial part of her great toe on her left foot.  No drainage or fluctuance.  No particular warmth.  Checking labs and x-ray.  Likely will need outpatient follow-up with podiatry.   [MB]    Clinical Course User Index [MB] Hayden Rasmussen, MD   MDM Rules/Calculators/A&P                      I have arranged follow-up with your primary doctor as  well as the wound center.  Patient has been stable here in the emergency department and was started on antibiotics.  Told to return here as needed. Final Clinical Impression(s) / ED Diagnoses Final diagnoses:  Open wound of foot, left, initial encounter    Rx / DC Orders ED Discharge Orders         Ordered    cephALEXin (KEFLEX) 500 MG capsule  4 times daily     11/25/19 1522           Dalia Heading, PA-C 12/02/19 1612    Hayden Rasmussen, MD 12/03/19 1226

## 2019-11-25 NOTE — Discharge Instructions (Signed)
Leakage an appointment with your primary care doctor on Thursday.  There is also a wound care appointment set up.  Keep the area clean and dry.  I would recommend covering it while wearing a shoe.  Today there is no significant acute abnormalities noted on your testing.

## 2019-11-25 NOTE — ED Triage Notes (Signed)
Patient complains of foot pain from small open wound to left medial area of foot, denies trauma but thinks related to shoes, not diabetic

## 2019-11-27 ENCOUNTER — Encounter: Payer: Self-pay | Admitting: Internal Medicine

## 2019-11-27 ENCOUNTER — Ambulatory Visit: Payer: BC Managed Care – PPO | Admitting: Internal Medicine

## 2019-11-27 ENCOUNTER — Ambulatory Visit: Payer: BC Managed Care – PPO

## 2019-11-27 VITALS — BP 149/75 | HR 83 | Temp 98.4°F | Wt 214.3 lb

## 2019-11-27 DIAGNOSIS — Z79899 Other long term (current) drug therapy: Secondary | ICD-10-CM

## 2019-11-27 DIAGNOSIS — S91302A Unspecified open wound, left foot, initial encounter: Secondary | ICD-10-CM

## 2019-11-27 DIAGNOSIS — S91102D Unspecified open wound of left great toe without damage to nail, subsequent encounter: Secondary | ICD-10-CM

## 2019-11-27 DIAGNOSIS — K7460 Unspecified cirrhosis of liver: Secondary | ICD-10-CM

## 2019-11-27 DIAGNOSIS — X58XXXD Exposure to other specified factors, subsequent encounter: Secondary | ICD-10-CM

## 2019-11-27 HISTORY — DX: Unspecified open wound, left foot, initial encounter: S91.302A

## 2019-11-27 LAB — PATHOLOGIST SMEAR REVIEW

## 2019-11-27 MED ORDER — NADOLOL 40 MG PO TABS: 40.0000 mg | ORAL_TABLET | Freq: Every day | ORAL | 3 refills | Status: DC

## 2019-11-27 MED ORDER — FUROSEMIDE 40 MG PO TABS: 40.0000 mg | ORAL_TABLET | Freq: Every day | ORAL | 3 refills | Status: DC

## 2019-11-27 MED FILL — FUROSEMIDE 40 MG TAB: 40 | 30 days supply | Qty: 30 | Fill #0

## 2019-11-27 NOTE — Progress Notes (Signed)
   CC: Wound and cirrhosis follow up  HPI:  Ms.Katniss Kellams is a 48 y.o. year-old female with PMH listed below who presents to clinic for wound and cirrhosis follow up. Please see problem based assessment and plan for further details.   Past Medical History:  Diagnosis Date  . Hepatitis C virus infection 01/04/2015  . History of malaria    Multiple episodes w/ 2 hospitalizations  . Hypertension    Review of Systems:   Review of Systems  Constitutional: Negative for chills, fever, malaise/fatigue and weight loss.  Skin:       L foot wound      Physical Exam:  Vitals:   11/27/19 1001  BP: (!) 149/75  Pulse: 83  Temp: 98.4 F (36.9 C)  TempSrc: Oral  SpO2: 99%  Weight: 214 lb 4.8 oz (97.2 kg)    General: well-appearing female in NAD  Cardiac: regular rate and rhythm, nl S1/S2, no murmurs, rubs or gallops  Pulm: CTAB, no wheezes or crackles, no increased work of breathing on room air  Derm: Foot wound on medial aspect of L foot with red, spongy tissue protruding from it. There is warmth around wound, no erythema or discharge.    Assessment & Plan:   See Encounters Tab for problem based charting.  Patient discussed with Dr. Evette Doffing

## 2019-11-27 NOTE — Patient Instructions (Addendum)
Ms. Dipierro,   Katherine Garza will need to see a foot specialist to remove the growth in your foot.   The pink dressing you can leave on at all times. But the dressing covering the pink one you can change. The pink dressing on your foot cannot get wet. It will stay for 7 days.   I refilled nadolol and furosemide.  Call us if you have any questions or concerns.    - Dr. Frederico Hamman

## 2019-11-27 NOTE — Progress Notes (Signed)
Internal Medicine Clinic Attending  Case discussed with Dr. Santos-Sanchez at the time of the visit.  We reviewed the resident's history and exam and pertinent patient test results.  I agree with the assessment, diagnosis, and plan of care documented in the resident's note.    

## 2019-11-27 NOTE — Assessment & Plan Note (Signed)
Patient presents with an open wound on medial aspect of left foot at the base of the great toe that she noticed 2 weeks ago.  She went to the ER 2 days ago and was recommended to follow-up with [place].  She was also given a 7-day course of cephalexin 500 mg QID.  She reports she has chronic, bilateral lower extremity edema and her shoes are tight most of the time which causes her to develop wounds frequently. She reports some tenderness around the wound, but no fevers or chills. On exam, there is red, spongy tissue protruding from the wound, possibly granulation tissue. Unable to rule out dermatologic malignancy. Placed an urgent referral to podiatry for further evaluation with biopsy and advised her to complete antibiotic course. Wound was dressed and patient + daughter were educated on how to change dressings.

## 2019-11-27 NOTE — Assessment & Plan Note (Signed)
Follows up with GI, Dr. Anson Fret. On nadolol 40 mg and Lasix 40 mg QD. Patient ran out of medications 2 weeks ago. Refilled.

## 2019-12-10 ENCOUNTER — Encounter: Payer: Self-pay | Admitting: Podiatry

## 2019-12-10 ENCOUNTER — Ambulatory Visit (INDEPENDENT_AMBULATORY_CARE_PROVIDER_SITE_OTHER): Payer: BC Managed Care – PPO

## 2019-12-10 ENCOUNTER — Other Ambulatory Visit: Payer: Self-pay | Admitting: Podiatry

## 2019-12-10 ENCOUNTER — Other Ambulatory Visit: Payer: Self-pay

## 2019-12-10 ENCOUNTER — Ambulatory Visit (INDEPENDENT_AMBULATORY_CARE_PROVIDER_SITE_OTHER): Payer: BC Managed Care – PPO | Admitting: Podiatry

## 2019-12-10 VITALS — BP 159/95

## 2019-12-10 DIAGNOSIS — D1801 Hemangioma of skin and subcutaneous tissue: Secondary | ICD-10-CM | POA: Diagnosis not present

## 2019-12-10 DIAGNOSIS — M7989 Other specified soft tissue disorders: Secondary | ICD-10-CM

## 2019-12-10 DIAGNOSIS — M79672 Pain in left foot: Secondary | ICD-10-CM | POA: Diagnosis not present

## 2019-12-10 MED ORDER — OXYCODONE-ACETAMINOPHEN 10-325 MG PO TABS: 1.0000 | ORAL_TABLET | Freq: Three times a day (TID) | ORAL | 0 refills | Status: AC | PRN

## 2019-12-10 MED FILL — OXYCODONE-ACETAMINOPHEN 10-: 10-325 | 5 days supply | Qty: 15 | Fill #0

## 2019-12-15 ENCOUNTER — Encounter: Payer: Self-pay | Admitting: Podiatry

## 2019-12-15 NOTE — Progress Notes (Signed)
Subjective:  Patient ID: Katherine Garza, female    DOB: 01-Oct-1971,  MRN: [id number]  Chief Complaint  Patient presents with  . Wound Check    pt has a wound on the right foot, pt states the wound has been going on for multiple weeks, pt states it could be due to the shoes she wears, pt is also unsure if she is a diabetic as well    49 y.o. female presents with the above complaint.  Patient presents with painful left soft tissue mass medial to the first metatarsophalangeal joint.  Patient states this started about 2 weeks ago.  Patient states it could be due to the shoes that she is wearing.  She states that it is causing her a lot of pain.  She states that there is no pain associated with it.  She is diabetic.  Patient is also looking to get her A1c as she is currently not known.  She denies any other acute complaints.  She has not seen anyone else for this.  Patient has a Optometrist bedside.   Review of Systems: Negative except as noted in the HPI. Denies N/V/F/Ch.  Past Medical History:  Diagnosis Date  . Hepatitis C virus infection 01/04/2015  . History of malaria    Multiple episodes w/ 2 hospitalizations  . Hypertension     Current Outpatient Medications:  .  cephALEXin (KEFLEX) 500 MG capsule, Take 1 capsule (500 mg total) by mouth 4 (four) times daily., Disp: 28 capsule, Rfl: 0 .  furosemide (LASIX) 40 MG tablet, Take 1 tablet (40 mg total) by mouth daily., Disp: 30 tablet, Rfl: 3 .  nadolol (CORGARD) 40 MG tablet, Take 1 tablet (40 mg total) by mouth daily., Disp: 30 tablet, Rfl: 3 .  oxyCODONE-acetaminophen (PERCOCET) 10-325 MG tablet, Take 1 tablet by mouth every 8 (eight) hours as needed for up to 5 days for pain., Disp: 15 tablet, Rfl: 0  Social History   Tobacco Use  Smoking Status Never Smoker  Smokeless Tobacco Never Used    No Known Allergies Objective:   Vitals:   12/10/19 0911  BP: (!) 159/95   There is no height or weight on file to calculate  BMI. Constitutional Well developed. Well nourished.  Vascular Dorsalis pedis pulses palpable bilaterally. Posterior tibial pulses palpable bilaterally. Capillary refill normal to all digits.  No cyanosis or clubbing noted. Pedal hair growth normal.  Neurologic Normal speech. Oriented to person, place, and time. Epicritic sensation to light touch grossly present bilaterally.  Dermatologic Nails well groomed and normal in appearance. No open wounds. No skin lesions.  Orthopedic:  Pain on palpation to the left medial aspect of the first metatarsophalangeal joint soft tissue mass.  Mild bleeding noted with palpation.  The mass itself is external to normal epidermal skin.  This is likely due to hemangioma.   Radiographs: 2 views of skeletally mature adult foot noted: There is an increasing soft tissue density and volume on the medial aspect of the first metatarsophalangeal joint.  No extension into the first metatarsal head.  No other bony deformities noted.  No signs of osteomyelitis noted.  No foreign body noted.  No soft tissue emphysema noted. Assessment:   1. Foot pain, left   2. Soft tissue mass    Plan:  Patient was evaluated and treated and all questions answered.  Left benign soft tissue lesion(hemagioma) -I explained to the patient the etiology of soft tissue lesion could possibly be due to shoe gear modifications.  However given that there is bleeding noted noted with pain when ambulating, I believe that this is likely hemangioma.  I explained to the patient that until we remove the soft tissue mass it is going to be painful and is going to cause a lot of bleeding.  I believe patient will benefit from excision of the soft tissue mass with application of Surgicel to provide hemostasis.  Patient agrees with the plan and would like to proceed with surgical excision in clinic today. -I plan on sending the soft tissue mass to pathology to be evaluated on the microscope. -Patient was  dressed in a Surgicel and aggressive dressings to prevent strikethrough of bleeding. -Patient will ambulate with surgical shoe. -Informed surgical risk consent was reviewed and read aloud to the patient.  I reviewed the films.  I have discussed my findings with the patient in great detail.  I have discussed all risks including but not limited to infection, stiffness, scarring, limp, disability, deformity, damage to blood vessels and nerves, numbness, poor healing, need for braces, arthritis, chronic pain, amputation, death.  All benefits and realistic expectations discussed in great detail.  I have made no promises as to the outcome.  I have provided realistic expectations.  I have offered the patient a 2nd opinion, which they have declined and assured me they preferred to proceed despite the risks  Procedure: Attention was directed to the left medial aspect of the first metatarsophalangeal joint soft tissue mass.  Betadine was used to cleanse the skin.  One-to-one mixture of half percent Marcaine plain and 2% lidocaine plain 10 cc was injected in Mayo block fashion to provide anesthesia.  Once patient was completely numb I proceeded with removal of the soft tissue mass.  Using a #15 blade the soft tissue mass was excised.  The soft tissue mass was sent to pathology.  The soft tissue mass was measuring to be 3 cm x 2 cm.  Upon excision aggressive bleeding was noted.  Bleeding was cauterized with Surgicel dressing ABD pad Kerlix gauze and Ace wrap.  It appears that compressive dressing was controlling the bleeding.  No further complication noted.  No follow-ups on file.

## 2019-12-16 ENCOUNTER — Encounter (HOSPITAL_BASED_OUTPATIENT_CLINIC_OR_DEPARTMENT_OTHER): Payer: BC Managed Care – PPO | Admitting: Internal Medicine

## 2019-12-19 ENCOUNTER — Encounter: Payer: Self-pay | Admitting: Podiatry

## 2019-12-19 ENCOUNTER — Other Ambulatory Visit: Payer: Self-pay

## 2019-12-19 ENCOUNTER — Ambulatory Visit (INDEPENDENT_AMBULATORY_CARE_PROVIDER_SITE_OTHER): Payer: BC Managed Care – PPO | Admitting: Podiatry

## 2019-12-19 DIAGNOSIS — M79672 Pain in left foot: Secondary | ICD-10-CM

## 2019-12-19 DIAGNOSIS — M7989 Other specified soft tissue disorders: Secondary | ICD-10-CM

## 2019-12-19 DIAGNOSIS — D1801 Hemangioma of skin and subcutaneous tissue: Secondary | ICD-10-CM

## 2019-12-19 NOTE — Patient Instructions (Signed)
Pre-Operative Instructions  Congratulations, you have decided to take an important step towards improving your quality of life.  You can be assured that the doctors and staff at [facility] will be with you every step of the way.  Here are some important things you should know:  1. Plan to be at the surgery center/hospital at least 1 (one) hour prior to your scheduled time, unless otherwise directed by the surgical center/hospital staff.  You must have a responsible adult accompany you, remain during the surgery and drive you home.  Make sure you have directions to the surgical center/hospital to ensure you arrive on time. 2. If you are having surgery at [facility] or [facility] hospitals, you will need a copy of your medical history and physical form from your family physician within one month prior to the date of surgery. We will give you a form for your primary physician to complete.  3. We make every effort to accommodate the date you request for surgery.  However, there are times where surgery dates or times have to be moved.  We will contact you as soon as possible if a change in schedule is required.   4. No aspirin/ibuprofen for one week before surgery.  If you are on aspirin, any non-steroidal anti-inflammatory medications (Mobic, Aleve, Ibuprofen) should not be taken seven (7) days prior to your surgery.  You make take Tylenol for pain prior to surgery.  5. Medications - If you are taking daily heart and blood pressure medications, seizure, reflux, allergy, asthma, anxiety, pain or diabetes medications, make sure you notify the surgery center/hospital before the day of surgery so they can tell you which medications you should take or avoid the day of surgery. 6. No food or drink after midnight the night before surgery unless directed otherwise by surgical center/hospital staff. 7. No alcoholic beverages 24-hours prior to surgery.  No smoking 24-hours prior or 24-hours after  surgery. 8. Wear loose pants or shorts. They should be loose enough to fit over bandages, boots, and casts. 9. Don't wear slip-on shoes. Sneakers are preferred. 10. Bring your boot with you to the surgery center/hospital.  Also bring crutches or a walker if your physician has prescribed it for you.  If you do not have this equipment, it will be provided for you after surgery. 11. If you have not been contacted by the surgery center/hospital by the day before your surgery, call to confirm the date and time of your surgery. 12. Leave-time from work may vary depending on the type of surgery you have.  Appropriate arrangements should be made prior to surgery with your employer. 13. Prescriptions will be provided immediately following surgery by your doctor.  Fill these as soon as possible after surgery and take the medication as directed. Pain medications will not be refilled on weekends and must be approved by the doctor. 14. Remove nail polish on the operative foot and avoid getting pedicures prior to surgery. 15. Wash the night before surgery.  The night before surgery wash the foot and leg well with water and the antibacterial soap provided. Be sure to pay special attention to beneath the toenails and in between the toes.  Wash for at least three (3) minutes. Rinse thoroughly with water and dry well with a towel.  Perform this wash unless told not to do so by your physician.  Enclosed: 1 Ice pack (please put in freezer the night before surgery)   1 Hibiclens skin cleaner     Pre-op instructions  If you have any questions regarding the instructions, please do not hesitate to call our office.  [place]: [address], [place], [place] [address] -- [phone]  [place]: [address] [place], [place] [address] -- [phone]  [place]: [address], [place], [place] [address] -- [phone]   Website: [email or web address] 

## 2019-12-19 NOTE — Progress Notes (Signed)
Subjective:  Patient ID: Kaitlynn Keidel, female    DOB: 1971-04-02,  MRN: [id number]  Chief Complaint  Patient presents with  . Wound Check    pt is here for a wound check of the left foot, pt is doing better since the last time she was here, pt also states that she is here to hear results of the pathology    49 y.o. female presents with the above complaint.  Patient presents with a follow-up from removal of the left soft tissue mass in office by me during last time.  She states that she is feeling much better in terms of pain however the soft tissue mass has essentially came right back.  She states that she has been ambulating with a surgical shoe. She states that there is some bleeding associated with it however her dressing did not get wet from the dressing that I placed on last time.  She denies any other acute complaints.  Patient states there is still bleeding present with dressing changes.     Review of Systems: Negative except as noted in the HPI. Denies N/V/F/Ch.  Past Medical History:  Diagnosis Date  . Hepatitis C virus infection 01/04/2015  . History of malaria    Multiple episodes w/ 2 hospitalizations  . Hypertension     Current Outpatient Medications:  .  cephALEXin (KEFLEX) 500 MG capsule, Take 1 capsule (500 mg total) by mouth 4 (four) times daily., Disp: 28 capsule, Rfl: 0 .  furosemide (LASIX) 40 MG tablet, Take 1 tablet (40 mg total) by mouth daily., Disp: 30 tablet, Rfl: 3 .  nadolol (CORGARD) 40 MG tablet, Take 1 tablet (40 mg total) by mouth daily., Disp: 30 tablet, Rfl: 3  Social History   Tobacco Use  Smoking Status Never Smoker  Smokeless Tobacco Never Used    No Known Allergies Objective:   There were no vitals filed for this visit. There is no height or weight on file to calculate BMI. Constitutional Well developed. Well nourished.  Vascular Dorsalis pedis pulses palpable bilaterally. Posterior tibial pulses palpable bilaterally. Capillary  refill normal to all digits.  No cyanosis or clubbing noted. Pedal hair growth normal.  Neurologic Normal speech. Oriented to person, place, and time. Epicritic sensation to light touch grossly present bilaterally.  Dermatologic Nails well groomed and normal in appearance. No open wounds. No skin lesions.  Orthopedic:  Pain on palpation to the left medial aspect of the first metatarsophalangeal joint soft tissue mass.  Mild bleeding noted with palpation.  The mass itself is external to normal epidermal skin.  This is likely due to hemangioma.   Radiographs: None Assessment:   1. Foot pain, left   2. Soft tissue mass   3. Hemangioma of subcutaneous tissue    Plan:  Patient was evaluated and treated and all questions answered.  Left benign soft tissue lesion(hemagioma) status post excision of the mass. -I explained to the patient that there is soft tissue tumor/possible hemangioma appears to be more aggressive in nature in terms of regrowth.  Patient may need a deeper excision and more of a wider excision to prevent it from coming back.  This will require me to do this case at the surgery center in order for me to excise it out completely.  Patient agrees with the plan would like to undergo a procedure to excise that hemangioma/soft tissue tumor in its entirety.  I still explained to the patient that there is a chance of recurrence rate occur.  Patient states understanding. -I plan on performing left soft tissue mass excision with wider margins. -Informed surgical risk consent was reviewed and read aloud to the patient.  I reviewed the films.  I have discussed my findings with the patient in great detail.  I have discussed all risks including but not limited to infection, stiffness, scarring, limp, disability, deformity, damage to blood vessels and nerves, numbness, poor healing, need for braces, arthritis, chronic pain, amputation, death.  All benefits and realistic expectations discussed in  great detail.  I have made no promises as to the outcome.  I have provided realistic expectations.  I have offered the patient a 2nd opinion, which they have declined and assured me they preferred to proceed despite the risks  No follow-ups on file.

## 2019-12-25 ENCOUNTER — Telehealth: Payer: Self-pay | Admitting: *Deleted

## 2019-12-25 NOTE — Telephone Encounter (Signed)
DOS 12/31/2019 EXCISION SOFT TISSUE MASS  LEFT FOOT - [id number]  BCBS: Eligibility Date - 11/10/2016 - 12/10/9998   In-Network    Max Per Benefit Period Year-to-Date Remaining  CoInsurance     Deductible  $1,100.00 $1,100.00  Out-Of-Pocket 3  $5,000.00 $5,000.00   -Information systems manager Required  Not Applicable  [id number] per Service Year  No

## 2019-12-31 DIAGNOSIS — M7989 Other specified soft tissue disorders: Secondary | ICD-10-CM

## 2019-12-31 DIAGNOSIS — D492 Neoplasm of unspecified behavior of bone, soft tissue, and skin: Secondary | ICD-10-CM | POA: Diagnosis not present

## 2019-12-31 MED ORDER — OXYCODONE-ACETAMINOPHEN 10-325 MG PO TABS: 1.0000 | ORAL_TABLET | Freq: Four times a day (QID) | ORAL | 0 refills | Status: AC | PRN

## 2019-12-31 MED FILL — OXYCODONE-ACETAMINOPHEN 10-: 10-325 | 7 days supply | Qty: 30 | Fill #0

## 2019-12-31 NOTE — Addendum Note (Signed)
Addended by: Boneta Lucks on: 12/31/2019 01:41 PM   Modules accepted: Orders

## 2020-01-07 ENCOUNTER — Ambulatory Visit (INDEPENDENT_AMBULATORY_CARE_PROVIDER_SITE_OTHER): Payer: BC Managed Care – PPO | Admitting: Podiatry

## 2020-01-07 ENCOUNTER — Other Ambulatory Visit: Payer: Self-pay

## 2020-01-07 DIAGNOSIS — Z9889 Other specified postprocedural states: Secondary | ICD-10-CM

## 2020-01-07 DIAGNOSIS — M7989 Other specified soft tissue disorders: Secondary | ICD-10-CM

## 2020-01-07 DIAGNOSIS — M79672 Pain in left foot: Secondary | ICD-10-CM

## 2020-01-08 ENCOUNTER — Encounter: Payer: Self-pay | Admitting: Podiatry

## 2020-01-08 NOTE — Progress Notes (Signed)
  Subjective:  Patient ID: Katherine Garza, female    DOB: 05/09/1971,  MRN: [id number]  Chief Complaint  Patient presents with  . Routine Post Op     POV#1 DOS 12/31/2019 EXC. SOFT TISSUE MASS LT, pt shows no signs of infection, sutures are intact, pt is taking pain meds, and is wearing boot as well, pt has no other comments or concerns    49 y.o. female returns for post-op check.  Patient states that she is doing well.  She does not have any pain to the incision site.  The sutures are intact.  She has been wearing her boot.  She has been taking her pain medication there is no clinical signs of infection.  Patient denies seeing a primary care doctor.  Review of Systems: Negative except as noted in the HPI. Denies N/V/F/Ch.  Past Medical History:  Diagnosis Date  . Hepatitis C virus infection 01/04/2015  . History of malaria    Multiple episodes w/ 2 hospitalizations  . Hypertension     Current Outpatient Medications:  .  cephALEXin (KEFLEX) 500 MG capsule, Take 1 capsule (500 mg total) by mouth 4 (four) times daily., Disp: 28 capsule, Rfl: 0 .  furosemide (LASIX) 40 MG tablet, Take 1 tablet (40 mg total) by mouth daily., Disp: 30 tablet, Rfl: 3 .  nadolol (CORGARD) 40 MG tablet, Take 1 tablet (40 mg total) by mouth daily., Disp: 30 tablet, Rfl: 3 .  oxyCODONE-acetaminophen (PERCOCET) 10-325 MG tablet, Take 1 tablet by mouth every 6 (six) hours as needed for up to 8 days for pain., Disp: 30 tablet, Rfl: 0  Social History   Tobacco Use  Smoking Status Never Smoker  Smokeless Tobacco Never Used    No Known Allergies Objective:  There were no vitals filed for this visit. There is no height or weight on file to calculate BMI. Constitutional Well developed. Well nourished.  Vascular Foot warm and well perfused. Capillary refill normal to all digits.   Neurologic Normal speech. Oriented to person, place, and time. Epicritic sensation to light touch grossly present bilaterally.   Dermatologic Skin healing well without signs of infection. Skin edges well coapted without signs of infection.  Orthopedic: Tenderness to palpation noted about the surgical site.   Radiographs: None Assessment:   1. Soft tissue mass   2. Foot pain, left   3. Status post foot surgery    Plan:  Patient was evaluated and treated and all questions answered.  S/p foot surgery left -Progressing as expected post-operatively. -XR: None -WB Status: Weightbearing as tolerated in cam boot -Sutures: Intact without any clinical signs of infection.  There is superficial dehiscence noted at the incision site with granular tissue underneath it.  The incision was about 1 cm x 0.3 cm x 0.2 cm -Medications: None -Foot redressed.  No follow-ups on file.

## 2020-01-14 ENCOUNTER — Other Ambulatory Visit: Payer: Self-pay

## 2020-01-14 ENCOUNTER — Ambulatory Visit (INDEPENDENT_AMBULATORY_CARE_PROVIDER_SITE_OTHER): Payer: BC Managed Care – PPO | Admitting: Podiatry

## 2020-01-14 DIAGNOSIS — Z9889 Other specified postprocedural states: Secondary | ICD-10-CM

## 2020-01-14 DIAGNOSIS — M7989 Other specified soft tissue disorders: Secondary | ICD-10-CM

## 2020-01-14 DIAGNOSIS — M79672 Pain in left foot: Secondary | ICD-10-CM

## 2020-01-15 ENCOUNTER — Encounter: Payer: Self-pay | Admitting: Podiatry

## 2020-01-15 NOTE — Progress Notes (Signed)
  Subjective:  Patient ID: Katherine Garza, female    DOB: [birth year],  MRN: [id number]  No chief complaint on file.   49 y.o. female returns for post-op check.  Patient states that she is doing well.  Sutures were removed at bedside.  Her pain is well controlled.  There is small dehiscence noted.  It appears to be superficial in nature.  No clinical signs of infection noted.  She has been wearing her boot regularly.  She has not been getting the bandages wet..  Review of Systems: Negative except as noted in the HPI. Denies N/V/F/Ch.  Past Medical History:  Diagnosis Date  . Hepatitis C virus infection 01/04/2015  . History of malaria    Multiple episodes w/ 2 hospitalizations  . Hypertension     Current Outpatient Medications:  .  cephALEXin (KEFLEX) 500 MG capsule, Take 1 capsule (500 mg total) by mouth 4 (four) times daily., Disp: 28 capsule, Rfl: 0 .  furosemide (LASIX) 40 MG tablet, Take 1 tablet (40 mg total) by mouth daily., Disp: 30 tablet, Rfl: 3 .  nadolol (CORGARD) 40 MG tablet, Take 1 tablet (40 mg total) by mouth daily., Disp: 30 tablet, Rfl: 3  Social History   Tobacco Use  Smoking Status Never Smoker  Smokeless Tobacco Never Used    No Known Allergies Objective:  There were no vitals filed for this visit. There is no height or weight on file to calculate BMI. Constitutional Well developed. Well nourished.  Vascular Foot warm and well perfused. Capillary refill normal to all digits.   Neurologic Normal speech. Oriented to person, place, and time. Epicritic sensation to light touch grossly present bilaterally.  Dermatologic Skin healing well without signs of infection.  Superficial central dehiscence noted measuring to be 1.5 cm x 0.5 cm x 0.5 cm.  No clinical signs of infection noted.  Orthopedic: Tenderness to palpation noted about the surgical site.   Radiographs: None Assessment:   1. Soft tissue mass   2. Foot pain, left   3. Status post foot  surgery    Plan:  Patient was evaluated and treated and all questions answered.  S/p foot surgery left -Progressing as expected post-operatively. -XR: None -WB Status: Weightbearing as tolerated in cam boot -Sutures: Intact without any clinical signs of infection.  There is superficial dehiscence noted at the incision site with granular tissue underneath it.  The incision was about 1.5 cm 0.5 cm x 0.5 cm.  It appears to be staying the same versus slowly regressing. -Medications: None -Foot redressed with Betadine wet-to-dry dressing changes.  I will continue to monitor this patient's postoperative outcomes closely given that there is dehiscence.  Patient will continue ambulate in a cam boot for now.  May transition her to a flat surgical shoe during next visit.  No follow-ups on file.

## 2020-01-16 ENCOUNTER — Encounter: Payer: Self-pay | Admitting: Podiatry

## 2020-01-26 ENCOUNTER — Encounter: Payer: Self-pay | Admitting: Podiatry

## 2020-01-28 ENCOUNTER — Encounter: Payer: Self-pay | Admitting: Podiatry

## 2020-01-28 ENCOUNTER — Other Ambulatory Visit: Payer: Self-pay

## 2020-01-28 ENCOUNTER — Ambulatory Visit (INDEPENDENT_AMBULATORY_CARE_PROVIDER_SITE_OTHER): Payer: BC Managed Care – PPO | Admitting: Podiatry

## 2020-01-28 DIAGNOSIS — Z9889 Other specified postprocedural states: Secondary | ICD-10-CM

## 2020-01-28 DIAGNOSIS — M7989 Other specified soft tissue disorders: Secondary | ICD-10-CM

## 2020-01-28 NOTE — Progress Notes (Signed)
  Subjective:  Patient ID: Katherine Garza, female    DOB: 1971/05/30,  MRN: [id number]  Chief Complaint  Patient presents with  . Routine Post Op    POV#3 DOS 12/31/2019 EXC. SOFT TISSUE MASS LT. Pt states healing well with no concerns. Denies fever/nausea/vomiting/chills.    49 y.o. female returns for post-op check.  Patient states that she is doing well.  The dehiscence from last time has completely reepithelialized.  She states she is doing well overall.  She denies any other acute complaints.  She does not have any pain.  Review of Systems: Negative except as noted in the HPI. Denies N/V/F/Ch.  Past Medical History:  Diagnosis Date  . Hepatitis C virus infection 01/04/2015  . History of malaria    Multiple episodes w/ 2 hospitalizations  . Hypertension     Current Outpatient Medications:  .  cephALEXin (KEFLEX) 500 MG capsule, Take 1 capsule (500 mg total) by mouth 4 (four) times daily., Disp: 28 capsule, Rfl: 0 .  furosemide (LASIX) 40 MG tablet, Take 1 tablet (40 mg total) by mouth daily., Disp: 30 tablet, Rfl: 3 .  nadolol (CORGARD) 40 MG tablet, Take 1 tablet (40 mg total) by mouth daily., Disp: 30 tablet, Rfl: 3  Social History   Tobacco Use  Smoking Status Never Smoker  Smokeless Tobacco Never Used    No Known Allergies Objective:  There were no vitals filed for this visit. There is no height or weight on file to calculate BMI. Constitutional Well developed. Well nourished.  Vascular Foot warm and well perfused. Capillary refill normal to all digits.   Neurologic Normal speech. Oriented to person, place, and time. Epicritic sensation to light touch grossly present bilaterally.  Dermatologic Skin healing well without signs of infection.  Healed no clinical signs of infection.  Orthopedic:  No tenderness to palpation noted about the surgical site.   Radiographs: None Assessment:   1. Soft tissue mass   2. Status post foot surgery    Plan:  Patient was  evaluated and treated and all questions answered.  S/p foot surgery left -Progressing as expected post-operatively. -XR: None -WB Status: Weightbearing as tolerated in regular sneakers -Sutures: None.  The skin has completely reepithelialized without any signs of dehiscence. -Medications: None -Patient can transition to regular sneakers as skin has completely reepithelialized.  Patient is officially discharged from my care and I have asked the patient if there is any foot and ankle issues that arises in the future including the surgical site please come back and see me.  Patient states understanding and will do so  No follow-ups on file.

## 2020-02-27 ENCOUNTER — Encounter: Payer: Self-pay | Admitting: Podiatry

## 2020-03-01 MED FILL — FUROSEMIDE 40 MG TAB: 40 | 30 days supply | Qty: 30 | Fill #1

## 2020-03-29 MED FILL — FUROSEMIDE 40 MG TAB: 40 | 30 days supply | Qty: 30 | Fill #2

## 2020-04-01 ENCOUNTER — Encounter: Payer: Self-pay | Admitting: *Deleted

## 2020-04-29 MED FILL — FUROSEMIDE 40 MG TAB: 40 | 30 days supply | Qty: 30 | Fill #3

## 2020-05-31 ENCOUNTER — Ambulatory Visit: Payer: BC Managed Care – PPO | Admitting: Internal Medicine

## 2020-05-31 VITALS — BP 131/66 | HR 71 | Temp 98.4°F | Ht 62.0 in | Wt 203.9 lb

## 2020-05-31 DIAGNOSIS — K7460 Unspecified cirrhosis of liver: Secondary | ICD-10-CM | POA: Diagnosis not present

## 2020-05-31 DIAGNOSIS — D509 Iron deficiency anemia, unspecified: Secondary | ICD-10-CM

## 2020-05-31 LAB — PROTIME-INR
INR: 1.3 — ABNORMAL HIGH (ref 0.8–1.2)
Prothrombin Time: 15.8 seconds — ABNORMAL HIGH (ref 11.4–15.2)

## 2020-05-31 MED ORDER — NADOLOL 40 MG PO TABS: 40.0000 mg | ORAL_TABLET | Freq: Every day | ORAL | 1 refills | Status: DC

## 2020-05-31 MED ORDER — FUROSEMIDE 40 MG PO TABS: 40.0000 mg | ORAL_TABLET | Freq: Every day | ORAL | 1 refills | Status: DC

## 2020-05-31 NOTE — Progress Notes (Signed)
   CC: hepatic cirrhosis, iron deficiency anemia  HPI:  [name] is a 49 y.o. female with PMH below.  Today we will address hepatic cirrhosis, iron deficiency anemia     Please see A&P for status of the patient's chronic medical conditions  Past Medical History:  Diagnosis Date  . Hepatitis C virus infection 01/04/2015  . History of malaria    Multiple episodes w/ 2 hospitalizations  . Hypertension    Review of Systems:  ROS: Pulmonary: pt denies increased work of breathing, shortness of breath,  Cardiac: pt denies palpitations, chest pain,  Abdominal: pt denies abdominal pain, nausea, vomiting, or diarrhea   Physical Exam:  Vitals:   05/31/20 1515  BP: 131/66  Pulse: 71  Temp: 98.4 F (36.9 C)  TempSrc: Oral  SpO2: 99%  Weight: 203 lb 14.4 oz (92.5 kg)  Height: 5\' 2"  (1.575 m)   Cardiac: normal rate and rhythm, clear s1 and s2, no rubs or gallops Pulmonary: CTAB, not in distress Abdominal: non distended abdomen, soft and nontender Psych: Alert, conversant, in good spirits   Social History   Socioeconomic History  . Marital status: Married    Spouse name: Not on file  . Number of children: Not on file  . Years of education: Not on file  . Highest education level: Not on file  Occupational History  . Not on file  Tobacco Use  . Smoking status: Never Smoker  . Smokeless tobacco: Never Used  Substance and Sexual Activity  . Alcohol use: No    Alcohol/week: 0.0 standard drinks  . Drug use: No  . Sexual activity: Not on file  Other Topics Concern  . Not on file  Social History Narrative   Moved from [place] in 09/2014 w/ husband and son. Previously worked as a Psychologist, sport and exercise w/ cows, Astronomer, and pigs.    Social Determinants of Health   Financial Resource Strain:   . Difficulty of Paying Living Expenses:   Food Insecurity:   . Worried About Charity fundraiser in the Last Year:   . Arboriculturist in the Last Year:   Transportation Needs:   .  Film/video editor (Medical):   [name] Lack of Transportation (Non-Medical):   Physical Activity:   . Days of Exercise per Week:   . Minutes of Exercise per Session:   Stress:   . Feeling of Stress :   Social Connections:   . Frequency of Communication with Friends and Family:   . Frequency of Social Gatherings with Friends and Family:   . Attends Religious Services:   . Active Member of Clubs or Organizations:   . Attends Archivist Meetings:   [name] Marital Status:   Intimate Partner Violence:   . Fear of Current or Ex-Partner:   . Emotionally Abused:   [name] Physically Abused:   . Sexually Abused:     No family history on file.  Assessment & Plan:   See Encounters Tab for problem based charting.  Patient discussed with Dr. Rebeca Alert

## 2020-05-31 NOTE — Patient Instructions (Signed)
[employer], nitaamuru vipimo kadhaa kutathmini utendaji wako wa ini na kuweka rufaa kwa ufuatiliaji wa GI. Endelea dawa zako za sasa kama ilivyoagizwa.

## 2020-06-01 ENCOUNTER — Telehealth: Payer: Self-pay | Admitting: Internal Medicine

## 2020-06-01 LAB — CMP14 + ANION GAP
ALT: 19 IU/L (ref 0–32)
AST: 25 IU/L (ref 0–40)
Albumin/Globulin Ratio: 1.5 (ref 1.2–2.2)
Albumin: 4.6 g/dL (ref 3.8–4.9)
Alkaline Phosphatase: 66 IU/L (ref 48–121)
Anion Gap: 15 mmol/L (ref 10.0–18.0)
BUN/Creatinine Ratio: 20 (ref 9–23)
BUN: 14 mg/dL (ref 6–24)
Bilirubin Total: 0.4 mg/dL (ref 0.0–1.2)
CO2: 26 mmol/L (ref 20–29)
Calcium: 9.4 mg/dL (ref 8.7–10.2)
Chloride: 101 mmol/L (ref 96–106)
Creatinine, Ser: 0.7 mg/dL (ref 0.57–1.00)
GFR calc Af Amer: 116 mL/min/{1.73_m2} (ref >59)
GFR calc non Af Amer: 101 mL/min/{1.73_m2} (ref >59)
Globulin, Total: 3.1 g/dL (ref 1.5–4.5)
Glucose: 430 mg/dL — ABNORMAL HIGH (ref 65–99)
Potassium: 4 mmol/L (ref 3.5–5.2)
Sodium: 142 mmol/L (ref 134–144)
Total Protein: 7.7 g/dL (ref 6.0–8.5)

## 2020-06-01 LAB — CBC WITH DIFFERENTIAL/PLATELET
Basophils Absolute: 0 10*3/uL (ref 0.0–0.2)
Basos: 1 %
EOS (ABSOLUTE): 0 10*3/uL (ref 0.0–0.4)
Eos: 2 %
Hematocrit: 34.7 % (ref 34.0–46.6)
Hemoglobin: 11.3 g/dL (ref 11.1–15.9)
Immature Grans (Abs): 0 10*3/uL (ref 0.0–0.1)
Immature Granulocytes: 1 %
Lymphocytes Absolute: 0.6 10*3/uL — ABNORMAL LOW (ref 0.7–3.1)
Lymphs: 41 %
MCH: 26.2 pg — ABNORMAL LOW (ref 26.6–33.0)
MCHC: 32.6 g/dL (ref 31.5–35.7)
MCV: 81 fL (ref 79–97)
Monocytes Absolute: 0.1 10*3/uL (ref 0.1–0.9)
Monocytes: 7 %
Neutrophils Absolute: 0.8 10*3/uL — ABNORMAL LOW (ref 1.4–7.0)
Neutrophils: 48 %
Platelets: 45 10*3/uL — CL (ref 150–450)
RBC: 4.31 x10E6/uL (ref 3.77–5.28)
RDW: 13.2 % (ref 11.7–15.4)
WBC: 1.5 10*3/uL — CL (ref 3.4–10.8)

## 2020-06-01 LAB — IRON AND TIBC
Iron Saturation: 18 % (ref 15–55)
Iron: 57 ug/dL (ref 27–159)
Total Iron Binding Capacity: 324 ug/dL (ref 250–450)
UIBC: 267 ug/dL (ref 131–425)

## 2020-06-01 LAB — FERRITIN: Ferritin: 143 ng/mL (ref 15–150)

## 2020-06-01 NOTE — Progress Notes (Signed)
Internal Medicine Clinic Attending  Case discussed with Dr. Winfrey at the time of the visit.  We reviewed the resident's history and exam and pertinent patient test results.  I agree with the assessment, diagnosis, and plan of care documented in the resident's note.  Meleni Delahunt, M.D., Ph.D.  

## 2020-06-01 NOTE — Assessment & Plan Note (Addendum)
Patient reports feeling well, without issues or complaints today.  She appears well compensated on examination remarkably.  She is maintaining her bp well.  She has been on non selective beta blocker and lasix chronically without issue.  She has not seen her GI physician in over a year.    -Will repeat abdominal US, liver function testing -referral to GI to reestablish care -continue nadolol and lasix

## 2020-06-01 NOTE — Telephone Encounter (Signed)
CBG significantly elevated on CMP.  Attempted to call patient but no answer.   Left a message on the daughter's cell phone (who speaks english very well) about the need for the patient to come back in for further evaluation and education. Can't start her on metformin due to cirrhosis so will likely require injectable therapy and will need education on this and A1C.  If the front desk staff would be vigilant for her call to set up a follow up appointment and if she does not by the end of day please reach out tomorrow as well.  Patient was feeling well during her visit, no signs of acidosis on CMP.

## 2020-06-01 NOTE — Telephone Encounter (Signed)
Attempted to reach patient by phone @ [phone] via Swahili interpreter ID# [phone] answer, had interpreter leave message on pt's recorder.  CMA also attempted to contact pt's dtr @ [phone] -no answer-CMA left message on recorder for return call.

## 2020-06-01 NOTE — Assessment & Plan Note (Addendum)
History of iron deficiency anemia.  No known bleeding, does have chronic inflammation and liver disease. Does not appear to have had an EGD  -Will repeat iron labs today and refer to GI

## 2020-06-05 ENCOUNTER — Ambulatory Visit: Payer: BC Managed Care – PPO | Attending: Internal Medicine

## 2020-06-05 DIAGNOSIS — Z23 Encounter for immunization: Secondary | ICD-10-CM

## 2020-06-05 NOTE — Progress Notes (Signed)
   Covid-19 Vaccination Clinic  Name:  Katherine Garza    MRN: [id number] DOB: [birth year]  06/05/2020  Katherine Garza was observed post Covid-19 immunization for 15 minutes without incident. She was provided with Vaccine Information Sheet and instruction to access the V-Safe system.   Katherine Garza was instructed to call 911 with any severe reactions post vaccine: [name] Difficulty breathing  . Swelling of face and throat  . A fast heartbeat  . A bad rash all over body  . Dizziness and weakness   Immunizations Administered    Name Date Dose VIS Date Route   Pfizer COVID-19 Vaccine 06/05/2020  9:37 AM 0.3 mL 02/04/2019 Intramuscular   Manufacturer: [place]   Lot: [id number]   [place]: [address]-3

## 2020-06-07 ENCOUNTER — Ambulatory Visit (INDEPENDENT_AMBULATORY_CARE_PROVIDER_SITE_OTHER): Payer: BC Managed Care – PPO | Admitting: Dietician

## 2020-06-07 ENCOUNTER — Other Ambulatory Visit: Payer: Self-pay | Admitting: Internal Medicine

## 2020-06-07 ENCOUNTER — Ambulatory Visit: Payer: BC Managed Care – PPO | Admitting: Internal Medicine

## 2020-06-07 ENCOUNTER — Telehealth: Payer: Self-pay | Admitting: *Deleted

## 2020-06-07 ENCOUNTER — Encounter: Payer: Self-pay | Admitting: Internal Medicine

## 2020-06-07 VITALS — BP 124/71 | HR 69 | Temp 97.5°F | Ht 64.0 in | Wt 202.5 lb

## 2020-06-07 DIAGNOSIS — E1165 Type 2 diabetes mellitus with hyperglycemia: Secondary | ICD-10-CM

## 2020-06-07 DIAGNOSIS — E119 Type 2 diabetes mellitus without complications: Secondary | ICD-10-CM | POA: Insufficient documentation

## 2020-06-07 LAB — POCT GLYCOSYLATED HEMOGLOBIN (HGB A1C): Hemoglobin A1C: 13.3 % — AB (ref 4.0–5.6)

## 2020-06-07 LAB — GLUCOSE, CAPILLARY: Glucose-Capillary: 281 mg/dL — ABNORMAL HIGH (ref 70–99)

## 2020-06-07 MED ORDER — SOLIQUA 100-33 UNT-MCG/ML ~~LOC~~ SOPN: 15.0000 [IU] | PEN_INJECTOR | Freq: Every day | SUBCUTANEOUS | 1 refills | Status: DC

## 2020-06-07 NOTE — Telephone Encounter (Addendum)
Information was sent through CoverMyMeds for [place] for [facility].  Awaiting determination within 24 hours.  [phone].  Sander Nephew, RN 06/07/2020 4:56 PM.  Call to [place] was approved 06/08/2020 thru 06/08/2023. PA [id number]  and patient has picked up the medication.  Sander Nephew, RN 07/21/2020 9:02

## 2020-06-07 NOTE — Progress Notes (Signed)
Diabetes Self-Management Education  Visit Type: First/Initial  Appt. Start Time: 1445 Appt. End Time: 4098  06/07/2020  [name], identified by name and date of birth, is a 49 y.o. female with a diagnosis of Diabetes: Type 2.   ASSESSMENT  Patient was seen today with daughter and two different swahili interpreters. She gave herself a pretend injection in her outer right arm and demonstrate how to use an insulin pen. She was provided with a sample Contour meter, 28 strips, 15 pen needles and lancets. She check her own blood sugar today with the assist of her daughter. The result was 399mg /dl. Onetouch may be the preferred brand for her insurance if so can provide her with a different meter.    Lab Results  Component Value Date   HGBA1C 13.3 (A) 06/07/2020   Wt Readings from Last 5 Encounters:  06/07/20 202 lb 8 oz (91.9 kg)  05/31/20 203 lb 14.4 oz (92.5 kg)  11/27/19 214 lb 4.8 oz (97.2 kg)  02/04/19 204 lb 1.6 oz (92.6 kg)  07/22/18 216 lb 4.8 oz (98.1 kg)      Diabetes Self-Management Education - 06/07/20 1600      Visit Information   Visit Type First/Initial      Initial Visit   Diabetes Type Type 2    Are you currently following a meal plan? No    Are you taking your medications as prescribed? Not on Medications    Date Diagnosed 06/07/20      Health Coping   How would you rate your overall health? Fair      Psychosocial Assessment   Self-care barriers English as a second language    Self-management support Doctor's office;Family;CDE visits    Other persons present Family Member    Special Needs Other (comment)   needs swahili interpreter   Preferred Learning Style Auditory;Visual;Hands on    Learning Readiness Ready      Pre-Education Assessment   Patient understands the diabetes disease and treatment process. Needs Instruction    Patient understands incorporating nutritional management into lifestyle. Needs Instruction    Patient understands  using medications safely. Needs Instruction    Patient understands monitoring blood glucose, interpreting and using results Needs Instruction    Patient understands prevention, detection, and treatment of acute complications. Needs Instruction      Complications   Last HgB A1C per patient/outside source 13 %    How often do you check your blood sugar? 0 times/day (not testing)    Fasting Blood glucose range (mg/dL) >200      Patient Education   Medications Taught/reviewed insulin injection, site rotation, insulin storage and needle disposal.    Monitoring Taught/evaluated SMBG meter.    Acute complications Taught treatment of hypoglycemia - the 15 rule.      Individualized Goals (developed by patient)   Medications take my medication as prescribed    Monitoring  test my blood glucose as discussed      Outcomes   Expected Outcomes Demonstrated interest in learning. Expect positive outcomes    Future DMSE 2 wks    Program Status Not Completed           Individualized Plan for Diabetes Self-Management Training:   Learning Objective:  Patient will have a greater understanding of diabetes self-management. Patient education plan is to attend individual and/or group sessions per assessed needs and concerns.   Plan:   There are no Patient Instructions on file for this visit.  Expected  Outcomes:  Demonstrated interest in learning. Expect positive outcomes  Education material provided: Diabetes Resources  If problems or questions, patient to contact team via:  Phone  Future DSME appointment: 2 wks

## 2020-06-07 NOTE — Progress Notes (Signed)
   CC: Hyperglycemia follow up  HPI:  [name] is a 49 y.o. with a PMHx as listed below who presents to the clinic for hyperglycemia follow up.   Please see the Encounters tab for problem-based Assessment & Plan regarding status of patient's conditions.  Past Medical History:  Diagnosis Date  . Hepatitis C virus infection 01/04/2015  . History of malaria    Multiple episodes w/ 2 hospitalizations  . Hypertension    Review of Systems: Review of Systems  Constitutional: Negative for chills and fever.  Gastrointestinal: Negative for abdominal pain, nausea and vomiting.  Genitourinary: Positive for frequency. Negative for dysuria and hematuria.  Endo/Heme/Allergies: Positive for polydipsia.   Physical Exam:  Vitals:   06/07/20 1326  BP: 124/71  Pulse: 69  Temp: (!) 97.5 F (36.4 C)  TempSrc: Oral  SpO2: 98%  Weight: 202 lb 8 oz (91.9 kg)  Height: 5\' 4"  (1.626 m)   Physical Exam Vitals and nursing note reviewed.  Constitutional:      General: She is not in acute distress.    Appearance: She is overweight.  Pulmonary:     Effort: Pulmonary effort is normal. No respiratory distress.  Skin:    General: Skin is warm and dry.  Neurological:     General: No focal deficit present.     Mental Status: She is alert and oriented to person, place, and time. Mental status is at baseline.  Psychiatric:        Mood and Affect: Mood normal.        Behavior: Behavior normal.    Assessment & Plan:   See Encounters Tab for problem based charting.  Patient discussed with Dr. Philipp Ovens

## 2020-06-07 NOTE — Patient Instructions (Signed)
It was nice seeing you today! Thank you for choosing [facility] for your Primary Care.    Today we talked about:   1. Diabetes: This is a new diagnosis for you. Today, you met with Butch Penny, our diabetes educator, who taught you how to use a glucometer and administer your new medication, Soliqua. Please make sure to use this medication daily before bedtime. In the morning, please check your blood sugar and keep a log.   ------------------------------------------------------------  [name] leo! Asante kwa kuchagua Tiba ya Ndani ya Koni kwa Huduma yako ya Msingi.   [name] tumezungumza juu ya:  1. Ugonjwa wa kisukari: Hii ni utambuzi mpya Renne Crigler. Patric Dykes [place], mwalimu wetu wa Edward Jolly wa sukari, ambaye alikufundisha jinsi ya kutumia glucometer na kutoa dawa [place], [place]. Tafadhali TCNGFREVQ kutumia dawa hii kila siku kabla ya Theadora Rama. Asubuhi, tafadhali angalia sukari yako ya damu na uweke kumbukumbu.

## 2020-06-08 NOTE — Assessment & Plan Note (Addendum)
Lab Results  Component Value Date   HGBA1C 13.3 (A) 06/07/2020   Katherine Garza states she was contacted by her PCP asking her to come into the clinic for evaluation of hyperglycemia on recent lab work. She endorses recent polyuria and polydipsia. She denies ever being told she is diabetic before and denies understanding of disease process. She notes her diet is high in food similar to yams.   Assessment/Plan:  Newly diagnosed diabetes, type 2. Education provided on disease process. We discussed importance of limiting carbohydrates in diet; will refer to Butch Penny, our diabetic education and dietician. Given her very high A1c at this time, will start with dual therapy including GLP-1 and insulin.    Patient would benefit from statin, but will need to be cautious given her cirrhosis. Discuss at next visit.   - Soliqua 15 units daily - Education on administration provided in clinic  - Referral to Diabetes educator and dietician, Sac provided in clinic - 2 week follow up - Lipid panel and urine microalbumin/creatinine ratio at next visit

## 2020-06-09 ENCOUNTER — Telehealth: Payer: Self-pay | Admitting: Student

## 2020-06-09 MED FILL — UNIFINE PENTIPS 32GX5/32: 32G X 4 MM | 90 days supply | Qty: 100 | Fill #0

## 2020-06-09 MED FILL — SOLIQUA 100 UNIT-33 MCG/ML: 100-33 | 20 days supply | Qty: 3 | Fill #0

## 2020-06-09 NOTE — Telephone Encounter (Signed)
Pls contact pharmacy [phone]

## 2020-06-09 NOTE — Progress Notes (Signed)
Internal Medicine Clinic Attending  Case discussed with Dr. Basaraba at the time of the visit.  We reviewed the resident's history and exam and pertinent patient test results.  I agree with the assessment, diagnosis, and plan of care documented in the resident's note.    

## 2020-06-09 NOTE — Telephone Encounter (Signed)
RTC to pharmacy, spoke with Alysia.  She states they need orders for pen needles for the [employer].  Verbal order given for Pen needles, 32 X 75mm, #100, NR; Dr. Janey Greaser  Dr. Charleen Kirks, Would you please add this to her medication list. Thank you! Higinio Roger, RN,BSN

## 2020-06-10 ENCOUNTER — Other Ambulatory Visit: Payer: Self-pay | Admitting: *Deleted

## 2020-06-10 DIAGNOSIS — E1165 Type 2 diabetes mellitus with hyperglycemia: Secondary | ICD-10-CM

## 2020-06-11 MED ORDER — "PEN NEEDLES 5/16"" 31G X 8 MM MISC": 1.0000 | 3 refills | Status: AC

## 2020-06-11 NOTE — Telephone Encounter (Signed)
Thank you! Signed the order for the needles this morning. Let me know if she need additional supplies.

## 2020-06-17 ENCOUNTER — Other Ambulatory Visit: Payer: Self-pay

## 2020-06-17 ENCOUNTER — Ambulatory Visit (HOSPITAL_COMMUNITY)
Admission: RE | Admit: 2020-06-17 | Discharge: 2020-06-17 | Disposition: A | Discharge status: Home / Self Care | Payer: BC Managed Care – PPO | Source: Ambulatory Visit | Attending: Internal Medicine | Admitting: Internal Medicine

## 2020-06-17 DIAGNOSIS — K7460 Unspecified cirrhosis of liver: Secondary | ICD-10-CM | POA: Diagnosis present

## 2020-06-18 ENCOUNTER — Other Ambulatory Visit: Payer: Self-pay | Admitting: Internal Medicine

## 2020-06-18 ENCOUNTER — Telehealth: Payer: Self-pay

## 2020-06-18 DIAGNOSIS — I81 Portal vein thrombosis: Secondary | ICD-10-CM

## 2020-06-18 NOTE — Telephone Encounter (Signed)
Received TC from [place] at [facility] regarding US of the Abdomen which was done yesterday.  She is asking that MD review impression under [place] result report ASAP. Will forward to Red team and today's attendings. Thank you, SChaplin, RN,BSN

## 2020-06-18 NOTE — Progress Notes (Signed)
Patient recently had an abdominal US performed on 06/17/2020 showing:  1. Liver cirrhosis without focal hepatic mass by ultrasound. 2. 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 The main portal vein is patent with normal direction of flow 3. Splenomegaly, splenic volume of 1068.5 cubic cm increased compared to the most recent prior volume of 952 cubic cm.  Patients last EGD was in 2016 showing grade 1 esophageal varices. She was followed by Dr. Penelope Coop at [facility]. He last appointment was  12/2018. Patient was recently referred back to GI by Dr. Sonia Side during his last appointment on 06/01/2020. I called Dr. Estell Harpin office to see if the patient would be able to get a sooner appointment. Unfortunately, Dr. Penelope Coop is out of town until Tuesday 07/13. I spoke to one of the medical assistance who will discuss this case with Dr. Penelope Coop and set her up with an appointment. I will fax over the most recent US images to [facility] today. I will also order follow up CT abdomen with contrast to further evaluate the portal thrombosis.  I call the patient and made her aware of the imaging result. She states that she is not having abdominal pain, nausea, emesis, abdominal distention/bloating or melena/hematochezia. discussed the plan with her and she agrees.   Plan: - CT Abdomen and pelvis with contrast - Follow up appointment with Dr. Penelope Coop and likely repeat EGD. - Fax [place] results Dr. Estell Harpin office.

## 2020-06-18 NOTE — Telephone Encounter (Signed)
Please call pt at [phone] and speak w/ her she is very worried

## 2020-06-25 ENCOUNTER — Other Ambulatory Visit: Payer: Self-pay | Admitting: Internal Medicine

## 2020-06-26 ENCOUNTER — Ambulatory Visit: Payer: BC Managed Care – PPO | Attending: Internal Medicine

## 2020-06-26 ENCOUNTER — Other Ambulatory Visit: Payer: Self-pay

## 2020-06-26 DIAGNOSIS — Z23 Encounter for immunization: Secondary | ICD-10-CM

## 2020-06-26 NOTE — Progress Notes (Signed)
   Covid-19 Vaccination Clinic  Name:  Katherine Garza    MRN: [id number] DOB: 1971-06-05  06/26/2020  [name] was observed post Covid-19 immunization for 15 minutes without incident. She was provided with Vaccine Information Sheet and instruction to access the V-Safe system.   [name] was instructed to call 911 with any severe reactions post vaccine: [name] Difficulty breathing  . Swelling of face and throat  . A fast heartbeat  . A bad rash all over body  . Dizziness and weakness   Immunizations Administered    Name Date Dose VIS Date Route   Pfizer COVID-19 Vaccine 06/26/2020  8:19 AM 0.3 mL 02/04/2019 Intramuscular   Manufacturer: [place]   Lot: [id number]   [place]: [address]-4

## 2020-06-29 MED FILL — SOLIQUA 100 UNIT-33 MCG/ML: 100-33 | 20 days supply | Qty: 3 | Fill #1

## 2020-07-01 ENCOUNTER — Other Ambulatory Visit: Payer: Self-pay | Admitting: Internal Medicine

## 2020-07-01 DIAGNOSIS — R161 Splenomegaly, not elsewhere classified: Secondary | ICD-10-CM

## 2020-07-01 NOTE — Progress Notes (Signed)
Ordering requested istat creatinine in preparation for upcoming CT with contrast.  May be done at any [facility] facility.

## 2020-07-02 ENCOUNTER — Other Ambulatory Visit: Payer: Self-pay

## 2020-07-02 ENCOUNTER — Ambulatory Visit (HOSPITAL_COMMUNITY)
Admission: RE | Admit: 2020-07-02 | Discharge: 2020-07-02 | Disposition: A | Discharge status: Home / Self Care | Payer: BC Managed Care – PPO | Source: Ambulatory Visit | Attending: Internal Medicine | Admitting: Internal Medicine

## 2020-07-02 DIAGNOSIS — I81 Portal vein thrombosis: Secondary | ICD-10-CM | POA: Insufficient documentation

## 2020-07-02 LAB — POCT I-STAT CREATININE: Creatinine, Ser: 0.5 mg/dL (ref 0.44–1.00)

## 2020-07-02 MED ORDER — SODIUM CHLORIDE (PF) 0.9 % IJ SOLN
INTRAMUSCULAR | Status: AC
  Filled 2020-07-02: qty 50

## 2020-07-02 MED ORDER — IOHEXOL 300 MG/ML  SOLN
100.0000 mL | Freq: Once | INTRAMUSCULAR | Status: AC | PRN
  Administered 2020-07-02: 100 mL via INTRAVENOUS

## 2020-07-05 NOTE — Progress Notes (Signed)
Patient called.  Left message for patient to call back.

## 2020-07-15 MED FILL — SOLIQUA 100 UNIT-33 MCG/ML: 100-33 | 20 days supply | Qty: 3 | Fill #2

## 2020-08-04 MED FILL — UNIFINE PENTIPS 32GX5/32: 32G X 4 MM | 30 days supply | Qty: 100 | Fill #0

## 2020-08-04 MED FILL — SOLIQUA 100 UNIT-33 MCG/ML: 100-33 | 20 days supply | Qty: 3 | Fill #3

## 2020-08-26 MED FILL — SOLIQUA 100 UNIT-33 MCG/ML: 100-33 | 20 days supply | Qty: 3 | Fill #4

## 2020-09-10 ENCOUNTER — Other Ambulatory Visit: Payer: Self-pay

## 2020-09-10 ENCOUNTER — Other Ambulatory Visit: Payer: Self-pay | Admitting: Nurse Practitioner

## 2020-09-10 ENCOUNTER — Other Ambulatory Visit (HOSPITAL_COMMUNITY): Payer: Self-pay | Admitting: Nurse Practitioner

## 2020-09-10 DIAGNOSIS — K7460 Unspecified cirrhosis of liver: Secondary | ICD-10-CM

## 2020-09-13 MED ORDER — FUROSEMIDE 40 MG PO TABS: 40.0000 mg | ORAL_TABLET | Freq: Every day | ORAL | 2 refills | Status: DC

## 2020-09-20 MED FILL — SOLIQUA 100 UNIT-33 MCG/ML: 100-33 | 20 days supply | Qty: 3 | Fill #5

## 2020-10-19 MED FILL — SOLIQUA 100 UNIT-33 MCG/ML: 100-33 | 20 days supply | Qty: 3 | Fill #6

## 2020-10-21 ENCOUNTER — Ambulatory Visit (HOSPITAL_COMMUNITY)
Admission: RE | Admit: 2020-10-21 | Discharge: 2020-10-21 | Disposition: A | Discharge status: Home / Self Care | Payer: BC Managed Care – PPO | Source: Ambulatory Visit | Attending: Nurse Practitioner | Admitting: Nurse Practitioner

## 2020-10-21 ENCOUNTER — Other Ambulatory Visit: Payer: Self-pay

## 2020-10-21 DIAGNOSIS — K7460 Unspecified cirrhosis of liver: Secondary | ICD-10-CM | POA: Diagnosis present

## 2020-10-21 LAB — POCT I-STAT CREATININE: Creatinine, Ser: 0.6 mg/dL (ref 0.44–1.00)

## 2020-10-21 MED ORDER — IOHEXOL 300 MG/ML  SOLN
100.0000 mL | Freq: Once | INTRAMUSCULAR | Status: AC | PRN
  Administered 2020-10-21: 100 mL via INTRAVENOUS

## 2020-10-28 ENCOUNTER — Other Ambulatory Visit: Payer: Self-pay | Admitting: Gastroenterology

## 2020-11-17 MED FILL — SOLIQUA 100 UNIT-33 MCG/ML: 100-33 | 20 days supply | Qty: 3 | Fill #7

## 2020-11-29 ENCOUNTER — Encounter (HOSPITAL_COMMUNITY): Payer: Self-pay | Admitting: Gastroenterology

## 2020-11-29 ENCOUNTER — Other Ambulatory Visit: Payer: Self-pay

## 2020-12-02 ENCOUNTER — Other Ambulatory Visit (HOSPITAL_COMMUNITY)
Admission: RE | Admit: 2020-12-02 | Discharge: 2020-12-02 | Disposition: A | Discharge status: Home / Self Care | Payer: BC Managed Care – PPO | Source: Ambulatory Visit | Attending: Gastroenterology | Admitting: Gastroenterology

## 2020-12-02 DIAGNOSIS — Z20822 Contact with and (suspected) exposure to covid-19: Secondary | ICD-10-CM | POA: Insufficient documentation

## 2020-12-02 DIAGNOSIS — Z01812 Encounter for preprocedural laboratory examination: Secondary | ICD-10-CM | POA: Diagnosis not present

## 2020-12-02 LAB — SARS CORONAVIRUS 2 (TAT 6-24 HRS): SARS Coronavirus 2: NEGATIVE

## 2020-12-02 NOTE — Progress Notes (Signed)
Pre-call done for endo procedure on Monday 12/27. Spoke with patients daughter who will be bringing the patient and speaks english. She confirmed the patient would quarantine over the weekend and would call if unable to or had any fevers. I told her that someone would call on Sunday to confirm the following.

## 2020-12-05 NOTE — Progress Notes (Signed)
Pre-call completed for additional screening for day before procedure. Spoke with pt daughter, Ellison Carwin.  The pt, Katherine Garza, denies having signs/symptoms of Covid (fever, cold symptoms, body aches, etc.) Pt was able to quarantine over the weekend.  Pt scheduled to arrive at 7:00 for 8:30am procedure.

## 2020-12-06 ENCOUNTER — Ambulatory Visit (HOSPITAL_COMMUNITY): Payer: BC Managed Care – PPO | Admitting: Certified Registered Nurse Anesthetist

## 2020-12-06 ENCOUNTER — Encounter (HOSPITAL_COMMUNITY)
Admission: RE | Disposition: A | Discharge status: Home / Self Care | Payer: Self-pay | Source: Home / Self Care | Attending: Gastroenterology

## 2020-12-06 ENCOUNTER — Other Ambulatory Visit: Payer: Self-pay

## 2020-12-06 ENCOUNTER — Ambulatory Visit (HOSPITAL_COMMUNITY)
Admission: RE | Admit: 2020-12-06 | Discharge: 2020-12-06 | Disposition: A | Discharge status: Home / Self Care | Payer: BC Managed Care – PPO | Attending: Gastroenterology | Admitting: Gastroenterology

## 2020-12-06 ENCOUNTER — Encounter (HOSPITAL_COMMUNITY): Payer: Self-pay | Admitting: Gastroenterology

## 2020-12-06 DIAGNOSIS — K766 Portal hypertension: Secondary | ICD-10-CM | POA: Diagnosis not present

## 2020-12-06 DIAGNOSIS — K3189 Other diseases of stomach and duodenum: Secondary | ICD-10-CM | POA: Diagnosis not present

## 2020-12-06 DIAGNOSIS — I8510 Secondary esophageal varices without bleeding: Secondary | ICD-10-CM | POA: Insufficient documentation

## 2020-12-06 DIAGNOSIS — Z1211 Encounter for screening for malignant neoplasm of colon: Secondary | ICD-10-CM | POA: Insufficient documentation

## 2020-12-06 DIAGNOSIS — Z8619 Personal history of other infectious and parasitic diseases: Secondary | ICD-10-CM | POA: Diagnosis not present

## 2020-12-06 HISTORY — PX: POLYPECTOMY: SHX5525

## 2020-12-06 HISTORY — PX: ESOPHAGEAL BANDING: SHX5518

## 2020-12-06 HISTORY — PX: ESOPHAGOGASTRODUODENOSCOPY (EGD) WITH PROPOFOL: SHX5813

## 2020-12-06 HISTORY — DX: Type 2 diabetes mellitus without complications: E11.9

## 2020-12-06 HISTORY — PX: COLONOSCOPY WITH PROPOFOL: SHX5780

## 2020-12-06 LAB — GLUCOSE, CAPILLARY: Glucose-Capillary: 96 mg/dL (ref 70–99)

## 2020-12-06 SURGERY — COLONOSCOPY WITH PROPOFOL
Anesthesia: Monitor Anesthesia Care

## 2020-12-06 MED ORDER — PROPOFOL 500 MG/50ML IV EMUL
INTRAVENOUS | Status: DC | PRN
  Administered 2020-12-06: 150 ug/kg/min via INTRAVENOUS

## 2020-12-06 MED ORDER — LACTATED RINGERS IV SOLN: INTRAVENOUS | Status: DC | PRN

## 2020-12-06 MED ORDER — HYDROMORPHONE HCL 1 MG/ML IJ SOLN
INTRAMUSCULAR | Status: AC
  Filled 2020-12-06: qty 1

## 2020-12-06 MED ORDER — SODIUM CHLORIDE 0.9 % IV SOLN: INTRAVENOUS | Status: DC

## 2020-12-06 MED ORDER — PROPOFOL 10 MG/ML IV BOLUS
INTRAVENOUS | Status: DC | PRN
  Administered 2020-12-06 (×3): 20 mg via INTRAVENOUS

## 2020-12-06 MED ORDER — HYDROMORPHONE HCL 1 MG/ML IJ SOLN
1.0000 mg | Freq: Once | INTRAMUSCULAR | Status: AC
  Administered 2020-12-06: 10:00:00 1 mg via INTRAVENOUS

## 2020-12-06 MED ORDER — LIDOCAINE 2% (20 MG/ML) 5 ML SYRINGE
INTRAMUSCULAR | Status: DC | PRN
  Administered 2020-12-06: 60 mg via INTRAVENOUS

## 2020-12-06 MED ORDER — PROPOFOL 1000 MG/100ML IV EMUL
INTRAVENOUS | Status: AC
  Filled 2020-12-06: qty 200

## 2020-12-06 MED ORDER — PROPOFOL 500 MG/50ML IV EMUL
INTRAVENOUS | Status: AC
  Filled 2020-12-06: qty 50

## 2020-12-06 SURGICAL SUPPLY — 24 items

## 2020-12-06 NOTE — Anesthesia Preprocedure Evaluation (Signed)
Anesthesia Evaluation  Patient identified by MRN, date of birth, ID band Patient awake    Reviewed: Allergy & Precautions, H&P , NPO status , Patient's Chart, lab work & pertinent test results  Airway Mallampati: II  TM Distance: >3 FB Neck ROM: Full    Dental no notable dental hx.    Pulmonary neg pulmonary ROS,    Pulmonary exam normal breath sounds clear to auscultation       Cardiovascular hypertension, Normal cardiovascular exam Rhythm:Regular Rate:Normal     Neuro/Psych negative neurological ROS  negative psych ROS   GI/Hepatic negative GI ROS, (+) Cirrhosis   Esophageal Varices    , Hepatitis -, C  Endo/Other  diabetes, Poorly Controlled, Type 2  Renal/GU negative Renal ROS  negative genitourinary   Musculoskeletal negative musculoskeletal ROS (+)   Abdominal   Peds negative pediatric ROS (+)  Hematology negative hematology ROS (+)   Anesthesia Other Findings   Reproductive/Obstetrics negative OB ROS                             Anesthesia Physical Anesthesia Plan  ASA: IV  Anesthesia Plan: MAC   Post-op Pain Management:    Induction: Intravenous  PONV Risk Score and Plan: 0  Airway Management Planned: Simple Face Mask  Additional Equipment:   Intra-op Plan:   Post-operative Plan:   Informed Consent: I have reviewed the patients History and Physical, chart, labs and discussed the procedure including the risks, benefits and alternatives for the proposed anesthesia with the patient or authorized representative who has indicated his/her understanding and acceptance.     Dental advisory given  Plan Discussed with: CRNA and Surgeon  Anesthesia Plan Comments:         Anesthesia Quick Evaluation

## 2020-12-06 NOTE — Discharge Instructions (Signed)
Colonoscopy, Adult, Care After This sheet gives you information about how to care for yourself after your procedure. Your doctor may also give you more specific instructions. If you have problems or questions, call your doctor. What can I expect after the procedure? After the procedure, it is common to have:  A small amount of blood in your poop (stool) for 24 hours.  Some gas.  Mild cramping or bloating in your belly (abdomen). Follow these instructions at home: Eating and drinking   Drink enough fluid to keep your pee (urine) pale yellow.  Follow instructions from your doctor about what you cannot eat or drink.  Return to your normal diet as told by your doctor. Avoid heavy or fried foods that are hard to digest. Activity  Rest as told by your doctor.  Do not sit for a long time without moving. Get up to take short walks every 1-2 hours. This is important. Ask for help if you feel weak or unsteady.  Return to your normal activities as told by your doctor. Ask your doctor what activities are safe for you. To help cramping and bloating:   Try walking around.  Put heat on your belly as told by your doctor. Use the heat source that your doctor recommends, such as a moist heat pack or a heating pad. ? Put a towel between your skin and the heat source. ? Leave the heat on for 20-30 minutes. ? Remove the heat if your skin turns bright red. This is very important if you are unable to feel pain, heat, or cold. You may have a greater risk of getting burned. General instructions  For the first 24 hours after the procedure: ? Do not drive or use machinery. ? Do not sign important documents. ? Do not drink alcohol. ? Do your daily activities more slowly than normal. ? Eat foods that are soft and easy to digest.  Take over-the-counter or prescription medicines only as told by your doctor.  Keep all follow-up visits as told by your doctor. This is important. Contact a doctor  if:  You have blood in your poop 2-3 days after the procedure. Get help right away if:  You have more than a small amount of blood in your poop.  You see large clumps of tissue (blood clots) in your poop.  Your belly is swollen.  You feel like you may vomit (nauseous).  You vomit.  You have a fever.  You have belly pain that gets worse, and medicine does not help your pain. Summary  After the procedure, it is common to have a small amount of blood in your poop. You may also have mild cramping and bloating in your belly.  For the first 24 hours after the procedure, do not drive or use machinery, do not sign important documents, and do not drink alcohol.  Get help right away if you have a lot of blood in your poop, feel like you may vomit, have a fever, or have more belly pain. This information is not intended to replace advice given to you by your health care provider. Make sure you discuss any questions you have with your health care provider. Document Revised: 06/23/2019 Document Reviewed: 06/23/2019 Elsevier Patient Education  2020 Elsevier Inc.  

## 2020-12-06 NOTE — H&P (Signed)
History of Present Illness  General:          She is from [place], her daughter is with her for translation.        49 year old female, previous patient of Dr. Penelope Coop, history of cirrhosis, last seen in the office on 07/21/20        CT from 07/02/20 showed cirrhosis, markedly dilated main portal pain, eccentric thrombus within proximal left portal vein, non-occlusive, portal hypertension, splenomegaly.        She has history of hepatitis C, treated in 2016 by ID.        EGD from 2016 showed grade 1 esophageal varices, otherwise unremarkable.        On epic, labs from 05/31/20 showed         TB/AST/ALT/ALP of 0.04/04/18/66, creatinine 0.7, blood sugar was 430, normal electrolytes.        Normal iron profile, saturation 18 percent, ferritin 143.        Hemoglobin 11.3, MCV 81, platelet 45.        PT 15.8, INR 1.3.        Alpha-fetoprotein was normal in 2016.        No prior colonoscopy.        Repeat CT from 10/21/20 showed        1. Cirrhosis. No liver masses.        2. Stable moderate splenomegaly. No ascites.        3. No new or residual portal vein thrombus.        She reports doing well.        She reports 1-2 Bms a day, denies abdominal or rectal pain, denies weigght loss or loss of appetite, denies blood in stool or black stools, denies nausea or vomiting, denies acid reflux or heartburn, denies difficulty or pain on swallowing.        Denies confusion, denies jaundice or vomiting blood.        Denies leg swelling or abdominal distension.     Assessments     1. History of hepatitis C - Z86.19 (Primary)   2. Other cirrhosis of liver - K74.69   3. Screen for colon cancer - Z12.11   4. Esophageal varices determined by endoscopy - I85.00     Treatment   1. History of hepatitis C   Notes: Currently There are no signs of decompensation. Will get labs for calculation of MELDNa score. As per documentation cirrhosis is secondary to hepatitis C, status post treatment.      2. Other  cirrhosis of liver   Start Furosemide Tablet, 40 MG, 1 tablet, Orally, Once a day, 90 days, 90 Tablet, Refills 1       LAB: CBC without Diff (Ordered for 10/28/2020)       LAB: Comp Metabolic Panel (Ordered for 10/28/2020)       LAB: PT (Prothrombin Time) (852778) (Ordered for 10/28/2020)       LAB: AFP, Serum, Tumor Marker ([id number]) (Ordered for 10/28/2020)       IMAGING: Esophagoscopy              Gonzalez,Melissa 10/28/2020 09:39:38 AM > Scheduled at [facility] for 12/06/20. Propofol.   Notes: Patient is on furosemide 40 milligrams once a day and nadolol40 milligrams a day, will sent prescriptions for the same.      3. Screen for colon cancer         IMAGING: Colonoscopy  Gonzalez,Melissa 10/28/2020 09:39:38 AM > Scheduled at [facility] for 12/06/20. Propofol.   Notes: Risks and benefits of the procedure we discussed with the patient in details. She understands and verbalizes consent. She will be scheduled for a screening colonoscopy along with EGD for possible banding of esophageal varices at [facility] as an outpatient.      4. Esophageal varices determined by endoscopy   Start Nadolol Tablet, 40 MG, 1 tablet, Orally, Once a day, 90 days, 90 Tablet, Refills 3 Notes: Continue nadolol for now, was scheduled for an EGD for follow-up of varices.            Vital Signs  Wt 217.6, Wt change 14.4 lb, Ht 64, BMI 37.35, Pulse sitting 69, BP sitting 128/70.     Examination  Gastroenterology::        GENERAL APPEARANCE: Well developed,overweight, no active distress, pleasant.         SCLERA: anicteric.         CARDIOVASCULAR Normal RRR .         RESPIRATORY Breath sounds normal. Respiration even and unlabored.         ABDOMEN No masses palpated. Liver and spleen not palpated, normal. Bowel sounds normal, Abdomen not distended.         EXTREMITIES: No edema.         NEURO: alert, oriented to time, place and person, normal gait.         PSYCH: mood/affect normal.     Assessments     1.  History of hepatitis C - Z86.19 (Primary)   2. Other cirrhosis of liver - K74.69   3. Screen for colon cancer - Z12.11   4. Esophageal varices determined by endoscopy - I85.00     Treatment   1. History of hepatitis C   Notes: Currently There are no signs of decompensation. Will get labs for calculation of MELDNa score. As per documentation cirrhosis is secondary to hepatitis C, status post treatment.      2. Other cirrhosis of liver   Start Furosemide Tablet, 40 MG, 1 tablet, Orally, Once a day, 90 days, 90 Tablet, Refills 1       LAB: CBC without Diff (Ordered for 10/28/2020)       LAB: Comp Metabolic Panel (Ordered for 10/28/2020)       LAB: PT (Prothrombin Time) (220254) (Ordered for 10/28/2020)       LAB: AFP, Serum, Tumor Marker ([id number]) (Ordered for 10/28/2020)       IMAGING: Esophagoscopy              Gonzalez,Melissa 10/28/2020 09:39:38 AM > Scheduled at [facility] for 12/06/20. Propofol.   Notes: Patient is on furosemide 40 milligrams once a day and nadolol40 milligrams a day, will sent prescriptions for the same.      3. Screen for colon cancer         IMAGING: Colonoscopy              Gonzalez,Melissa 10/28/2020 09:39:38 AM > Scheduled at [facility] for 12/06/20. Propofol.   Notes: Risks and benefits of the procedure we discussed with the patient in details. She understands and verbalizes consent. She will be scheduled for a screening colonoscopy along with EGD for possible banding of esophageal varices at [facility] as an outpatient.      4. Esophageal varices determined by endoscopy   Start Nadolol Tablet, 40 MG, 1 tablet, Orally, Once a day, 90 days, 90 Tablet, Refills 3 Notes: Continue  nadolol for now, was scheduled for an EGD for follow-up of varices.

## 2020-12-06 NOTE — Anesthesia Postprocedure Evaluation (Signed)
Anesthesia Post Note  Patient: Katherine Garza  Procedure(s) Performed: COLONOSCOPY WITH PROPOFOL (N/A ) ESOPHAGOGASTRODUODENOSCOPY (EGD) WITH PROPOFOL (N/A ) ESOPHAGEAL BANDING POLYPECTOMY     Patient location during evaluation: PACU Anesthesia Type: MAC Level of consciousness: awake and alert Pain management: pain level controlled Vital Signs Assessment: post-procedure vital signs reviewed and stable Respiratory status: spontaneous breathing, nonlabored ventilation, respiratory function stable and patient connected to nasal cannula oxygen Cardiovascular status: stable and blood pressure returned to baseline Postop Assessment: no apparent nausea or vomiting Anesthetic complications: no   No complications documented.  Last Vitals:  Vitals:   12/06/20 0940 12/06/20 0950  BP: (!) 145/111 (!) 158/88  Pulse: 70 72  Resp: (!) 22 15  Temp:    SpO2: 100% 98%    Last Pain:  Vitals:   12/06/20 0950  TempSrc:   PainSc: 7                  [name]

## 2020-12-06 NOTE — Brief Op Note (Signed)
12/06/2020  9:23 AM  PATIENT:  Katherine Garza  49 y.o. female  PRE-OPERATIVE DIAGNOSIS:  Screening/cirrhosis  POST-OPERATIVE DIAGNOSIS:  EGD: Grade 3 Varices; 4 bands placed; portal hypertensive  gastropathy; Colonoscopy: ascending polyp, rectal polyp  PROCEDURE:  Procedure(s) with comments: COLONOSCOPY WITH PROPOFOL (N/A) ESOPHAGOGASTRODUODENOSCOPY (EGD) WITH PROPOFOL (N/A) - With Banding ESOPHAGEAL BANDING POLYPECTOMY  SURGEON:  Surgeon(s) and Role:    Kerin Salen, MD - Primary  PHYSICIAN ASSISTANT:   ASSISTANTS: Anne Hahn, Tech  ANESTHESIA:   none  EBL:  none  BLOOD ADMINISTERED:none  DRAINS: none   LOCAL MEDICATIONS USED:  NONE  SPECIMEN:  Biopsy / Limited Resection  DISPOSITION OF SPECIMEN:  PATHOLOGY  COUNTS:  YES  TOURNIQUET:  * No tourniquets in log *  DICTATION: .Dragon Dictation  PLAN OF CARE: Discharge to home after PACU  PATIENT DISPOSITION:  PACU - hemodynamically stable.   Delay start of Pharmacological VTE agent (>24hrs) due to surgical blood loss or risk of bleeding: not applicable

## 2020-12-06 NOTE — Op Note (Signed)
[facility] Patient Name: Katherine Garza Procedure Date: 12/06/2020 MRN: [id number] Attending MD: Ronnette Juniper , MD Date of Birth: [birth year] CSN: [id number] Age: 49 Admit Type: Outpatient Procedure:                Colonoscopy Indications:              Screening for colorectal malignant neoplasm, This                            is the patient's first colonoscopy Providers:                Ronnette Juniper, MD, Benay Pillow, RN, Benetta Spar,                            Technician Referring MD:             Cato Mulligan, MD Medicines:                Monitored Anesthesia Care Complications:            No immediate complications. Estimated Blood Loss:     Estimated blood loss: none. Procedure:                Pre-Anesthesia Assessment:                           - Prior to the procedure, a History and Physical                            was performed, and patient medications and                            allergies were reviewed. The patient's tolerance of                            previous anesthesia was also reviewed. The risks                            and benefits of the procedure and the sedation                            options and risks were discussed with the patient.                            All questions were answered, and informed consent                            was obtained. Prior Anticoagulants: The patient has                            taken no previous anticoagulant or antiplatelet                            agents. ASA Grade Assessment: IV - A patient with  severe systemic disease that is a constant threat                            to life. After reviewing the risks and benefits,                            the patient was deemed in satisfactory condition to                            undergo the procedure.                           - Prior to the procedure, a History and Physical                            was  performed, and patient medications and                            allergies were reviewed. The patient's tolerance of                            previous anesthesia was also reviewed. The risks                            and benefits of the procedure and the sedation                            options and risks were discussed with the patient.                            All questions were answered, and informed consent                            was obtained. Prior Anticoagulants: The patient has                            taken no previous anticoagulant or antiplatelet                            agents. ASA Grade Assessment: IV - A patient with                            severe systemic disease that is a constant threat                            to life. After reviewing the risks and benefits,                            the patient was deemed in satisfactory condition to                            undergo the procedure.  After obtaining informed consent, the colonoscope                            was passed under direct vision. Throughout the                            procedure, the patient's blood pressure, pulse, and                            oxygen saturations were monitored continuously. The                            [id number] Olympus pediatric colonscope                            was introduced through the anus and advanced to the                            the cecum, identified by appendiceal orifice and                            ileocecal valve. The colonoscopy was performed                            without difficulty. The patient tolerated the                            procedure well. The quality of the bowel                            preparation was adequate to identify polyps 6 mm                            and larger in size. Scope In: 8:55:00 AM Scope Out: 9:16:01 AM Scope Withdrawal Time: 0 hours 18 minutes 4 seconds  Total  Procedure Duration: 0 hours 21 minutes 1 second  Findings:      Skin tags were found on perianal exam.      A 7 mm polyp was found in the ascending colon. The polyp was sessile.       The polyp was removed with a hot snare. Resection and retrieval were       complete.      A 6 mm polyp was found in the rectum. The polyp was sessile. The polyp       was removed with a hot snare. Resection and retrieval were complete.      The exam was otherwise without abnormality on direct and retroflexion       views. Impression:               - Perianal skin tags found on perianal exam.                           - One 7 mm polyp in the ascending colon, removed  with a hot snare. Resected and retrieved.                           - One 6 mm polyp in the rectum, removed with a hot                            snare. Resected and retrieved.                           - The examination was otherwise normal on direct                            and retroflexion views. Moderate Sedation:      Patient did not receive moderate sedation for this procedure, but       instead received monitored anesthesia care. Recommendation:           - Patient has a contact number available for                            emergencies. The signs and symptoms of potential                            delayed complications were discussed with the                            patient. Return to normal activities tomorrow.                            Written discharge instructions were provided to the                            patient.                           - Resume regular diet.                           - Continue present medications.                           - Await pathology results.                           - Repeat colonoscopy for surveillance based on                            pathology results. Procedure Code(s):        --- Professional ---                           [id number], Colonoscopy, flexible;  with removal of                            tumor(s), polyp(s), or other lesion(s) by snare  technique Diagnosis Code(s):        --- Professional ---                           Z12.11, Encounter for screening for malignant                            neoplasm of colon                           K63.5, Polyp of colon                           K62.1, Rectal polyp                           K64.4, Residual hemorrhoidal skin tags CPT copyright 2019 [employer]. All rights reserved. The codes documented in this report are preliminary and upon coder review may  be revised to meet current compliance requirements. Ronnette Juniper, MD 12/06/2020 9:29:41 AM This report has been signed electronically. Number of Addenda: 0

## 2020-12-06 NOTE — Transfer of Care (Signed)
Immediate Anesthesia Transfer of Care Note  Patient: Jaleeyah Lovecchio  Procedure(s) Performed: COLONOSCOPY WITH PROPOFOL (N/A ) ESOPHAGOGASTRODUODENOSCOPY (EGD) WITH PROPOFOL (N/A ) ESOPHAGEAL BANDING POLYPECTOMY  Patient Location: Endoscopy Unit  Anesthesia Type:MAC  Level of Consciousness: drowsy and responds to stimulation  Airway & Oxygen Therapy: Patient Spontanous Breathing and Patient connected to face mask oxygen  Post-op Assessment: Report given to RN, Post -op Vital signs reviewed and stable and Patient moving all extremities X 4  Post vital signs: Reviewed and stable  Last Vitals:  Vitals Value Taken Time  BP 145/93 12/06/20 0923  Temp    Pulse 80 12/06/20 0926  Resp 19 12/06/20 0926  SpO2 100 % 12/06/20 0926  Vitals shown include unvalidated device data.  Last Pain:  Vitals:   12/06/20 0717  TempSrc: Oral  PainSc: 0-No pain         Complications: No complications documented.

## 2020-12-06 NOTE — Op Note (Signed)
[facility] Patient Name: Katherine Garza Procedure Date: 12/06/2020 MRN: [id number] Attending MD: Ronnette Juniper , MD Date of Birth: [birth year] CSN: [id number] Age: 49 Admit Type: Outpatient Procedure:                Upper GI endoscopy Indications:              Follow-up of esophageal varices Providers:                Ronnette Juniper, MD, Benay Pillow, RN, Benetta Spar,                            Technician, Caryl Pina CRNA Referring MD:             Cato Mulligan, MD Medicines:                Monitored Anesthesia Care Complications:            No immediate complications. Estimated blood loss:                            None. Estimated Blood Loss:     Estimated blood loss: none. Procedure:                Pre-Anesthesia Assessment:                           - Prior to the procedure, a History and Physical                            was performed, and patient medications and                            allergies were reviewed. The patient's tolerance of                            previous anesthesia was also reviewed. The risks                            and benefits of the procedure and the sedation                            options and risks were discussed with the patient.                            All questions were answered, and informed consent                            was obtained. Prior Anticoagulants: The patient has                            taken no previous anticoagulant or antiplatelet                            agents. ASA Grade Assessment: IV - A patient with  severe systemic disease that is a constant threat                            to life. After reviewing the risks and benefits,                            the patient was deemed in satisfactory condition to                            undergo the procedure.                           After obtaining informed consent, the endoscope was                            passed  under direct vision. Throughout the                            procedure, the patient's blood pressure, pulse, and                            oxygen saturations were monitored continuously. The                            GIF-H190 ([id number]) was introduced through the                            mouth, and advanced to the second part of duodenum.                            The upper GI endoscopy was accomplished without                            difficulty. The patient tolerated the procedure                            well. Scope In: Scope Out: Findings:      Grade III varices were found in the middle third of the esophagus and in       the lower third of the esophagus. They were large in size. Four bands       were successfully placed with complete eradication, resulting in       deflation of varices. There was no bleeding during and at the end of the       procedure.      Diffuse mildly erythematous mucosa without bleeding was found in the       gastric antrum.      Mild portal hypertensive gastropathy was found in the gastric body.      The cardia and gastric fundus were normal on retroflexion. There was no       evidence of gastric varcies.      The examined duodenum was normal. Impression:               - Grade III esophageal varices. Completely  eradicated. Banded.                           - Erythematous mucosa in the antrum.                           - Portal hypertensive gastropathy.                           - Normal examined duodenum.                           - No specimens collected. Moderate Sedation:      Patient did not receive moderate sedation for this procedure, but       instead received monitored anesthesia care. Recommendation:           - Patient has a contact number available for                            emergencies. The signs and symptoms of potential                            delayed complications were discussed with the                             patient. Return to normal activities tomorrow.                            Written discharge instructions were provided to the                            patient.                           - Advance diet as tolerated.                           - Continue present medications.                           - Repeat upper endoscopy in 2 months for                            retreatment.                           - Return to GI office as previously scheduled. Procedure Code(s):        --- Professional ---                           [phone], Esophagogastroduodenoscopy, flexible,                            transoral; with band ligation of esophageal/gastric                            varices Diagnosis Code(s):        ---  Professional ---                           I85.00, Esophageal varices without bleeding                           K31.89, Other diseases of stomach and duodenum                           K76.6, Portal hypertension CPT copyright 2019 American Medical Association. All rights reserved. The codes documented in this report are preliminary and upon coder review may  be revised to meet current compliance requirements. Kerin Salen, MD 12/06/2020 9:27:18 AM This report has been signed electronically. Number of Addenda: 0

## 2020-12-06 NOTE — Interval H&P Note (Signed)
History and Physical Interval Note: 49/female for EGD for variceal screening, possible banding and colonoscopy for screening for colon cancer.  12/06/2020 8:25 AM  Katherine Garza  has presented today for EGD with possible banding and colonoscopy with propofol, with the diagnosis of Screening/cirrhosis.  The various methods of treatment have been discussed with the patient and family. After consideration of risks, benefits and other options for treatment, the patient has consented to  Procedure(s) with comments: COLONOSCOPY WITH PROPOFOL (N/A) ESOPHAGOGASTRODUODENOSCOPY (EGD) WITH PROPOFOL (N/A) - With Banding as a surgical intervention.  The patient's history has been reviewed, patient examined, no change in status, stable for surgery.  I have reviewed the patient's chart and labs.  Questions were answered to the patient's satisfaction.     Kerin Salen

## 2020-12-07 ENCOUNTER — Encounter (HOSPITAL_COMMUNITY): Payer: Self-pay | Admitting: Gastroenterology

## 2020-12-08 LAB — SURGICAL PATHOLOGY

## 2020-12-08 MED FILL — SOLIQUA 100 UNIT-33 MCG/ML: 100-33 | 20 days supply | Qty: 3 | Fill #8

## 2020-12-30 MED FILL — SOLIQUA 100 UNIT-33 MCG/ML: 100-33 | 20 days supply | Qty: 3 | Fill #9

## 2021-01-31 ENCOUNTER — Other Ambulatory Visit: Payer: Self-pay | Admitting: Internal Medicine

## 2021-01-31 DIAGNOSIS — E1165 Type 2 diabetes mellitus with hyperglycemia: Secondary | ICD-10-CM

## 2021-02-01 ENCOUNTER — Other Ambulatory Visit: Payer: Self-pay | Admitting: Student

## 2021-02-01 MED FILL — SOLIQUA 100 UNIT-33 MCG/ML: 100-33 | 20 days supply | Qty: 3 | Fill #0

## 2021-02-03 ENCOUNTER — Other Ambulatory Visit: Payer: Self-pay | Admitting: Gastroenterology

## 2021-02-18 MED FILL — UNIFINE PENTIPS 32GX5/32: 32G X 4 MM | 30 days supply | Qty: 100 | Fill #1

## 2021-03-07 ENCOUNTER — Other Ambulatory Visit: Payer: Self-pay | Admitting: Student

## 2021-03-07 ENCOUNTER — Ambulatory Visit: Payer: BC Managed Care – PPO | Attending: Internal Medicine

## 2021-03-07 DIAGNOSIS — E1165 Type 2 diabetes mellitus with hyperglycemia: Secondary | ICD-10-CM

## 2021-03-07 DIAGNOSIS — Z20822 Contact with and (suspected) exposure to covid-19: Secondary | ICD-10-CM

## 2021-03-08 ENCOUNTER — Other Ambulatory Visit: Payer: Self-pay

## 2021-03-08 LAB — SARS-COV-2, NAA 2 DAY TAT

## 2021-03-08 LAB — NOVEL CORONAVIRUS, NAA: SARS-CoV-2, NAA: NOT DETECTED

## 2021-03-09 ENCOUNTER — Other Ambulatory Visit: Payer: Self-pay | Admitting: Internal Medicine

## 2021-03-09 ENCOUNTER — Other Ambulatory Visit: Payer: Self-pay | Admitting: Nurse Practitioner

## 2021-03-09 DIAGNOSIS — K7469 Other cirrhosis of liver: Secondary | ICD-10-CM

## 2021-03-10 ENCOUNTER — Ambulatory Visit (HOSPITAL_COMMUNITY): Payer: BC Managed Care – PPO | Admitting: Anesthesiology

## 2021-03-10 ENCOUNTER — Encounter (HOSPITAL_COMMUNITY): Payer: Self-pay | Admitting: Gastroenterology

## 2021-03-10 ENCOUNTER — Encounter (HOSPITAL_COMMUNITY)
Admission: RE | Disposition: A | Discharge status: Home / Self Care | Payer: Self-pay | Source: Home / Self Care | Attending: Gastroenterology

## 2021-03-10 ENCOUNTER — Ambulatory Visit (HOSPITAL_COMMUNITY)
Admission: RE | Admit: 2021-03-10 | Discharge: 2021-03-10 | Disposition: A | Discharge status: Home / Self Care | Payer: BC Managed Care – PPO | Attending: Gastroenterology | Admitting: Gastroenterology

## 2021-03-10 ENCOUNTER — Other Ambulatory Visit: Payer: Self-pay

## 2021-03-10 DIAGNOSIS — Z794 Long term (current) use of insulin: Secondary | ICD-10-CM | POA: Insufficient documentation

## 2021-03-10 DIAGNOSIS — K3189 Other diseases of stomach and duodenum: Secondary | ICD-10-CM | POA: Diagnosis not present

## 2021-03-10 DIAGNOSIS — K7460 Unspecified cirrhosis of liver: Secondary | ICD-10-CM | POA: Insufficient documentation

## 2021-03-10 DIAGNOSIS — Z79899 Other long term (current) drug therapy: Secondary | ICD-10-CM | POA: Insufficient documentation

## 2021-03-10 DIAGNOSIS — I8510 Secondary esophageal varices without bleeding: Secondary | ICD-10-CM | POA: Insufficient documentation

## 2021-03-10 DIAGNOSIS — K766 Portal hypertension: Secondary | ICD-10-CM | POA: Diagnosis not present

## 2021-03-10 HISTORY — PX: GASTRIC VARICES BANDING: SHX5519

## 2021-03-10 HISTORY — PX: ESOPHAGOGASTRODUODENOSCOPY (EGD) WITH PROPOFOL: SHX5813

## 2021-03-10 LAB — GLUCOSE, CAPILLARY: Glucose-Capillary: 136 mg/dL — ABNORMAL HIGH (ref 70–99)

## 2021-03-10 SURGERY — ESOPHAGOGASTRODUODENOSCOPY (EGD) WITH PROPOFOL
Anesthesia: Monitor Anesthesia Care

## 2021-03-10 MED ORDER — LACTATED RINGERS IV SOLN: INTRAVENOUS | Status: DC

## 2021-03-10 MED ORDER — ONDANSETRON HCL 4 MG/2ML IJ SOLN
4.0000 mg | Freq: Once | INTRAMUSCULAR | Status: AC
  Administered 2021-03-10: 4 mg via INTRAVENOUS

## 2021-03-10 MED ORDER — ONDANSETRON HCL 4 MG/2ML IJ SOLN
INTRAMUSCULAR | Status: AC
  Filled 2021-03-10: qty 2

## 2021-03-10 MED ORDER — PROPOFOL 500 MG/50ML IV EMUL
INTRAVENOUS | Status: DC | PRN
  Administered 2021-03-10: 175 ug/kg/min via INTRAVENOUS

## 2021-03-10 SURGICAL SUPPLY — 14 items

## 2021-03-10 NOTE — Anesthesia Preprocedure Evaluation (Addendum)
Anesthesia Evaluation  Patient identified by MRN, date of birth, ID band Patient awake    Reviewed: Allergy & Precautions, NPO status , Patient's Chart, lab work & pertinent test results  Airway Mallampati: II  TM Distance: >3 FB Neck ROM: Full    Dental no notable dental hx.    Pulmonary neg pulmonary ROS,    Pulmonary exam normal breath sounds clear to auscultation       Cardiovascular hypertension, Normal cardiovascular exam Rhythm:Regular Rate:Normal     Neuro/Psych negative neurological ROS  negative psych ROS   GI/Hepatic GERD  ,(+) Cirrhosis   Esophageal Varices and ascites    , Treated Hep C   Endo/Other  diabetes  Renal/GU negative Renal ROS  negative genitourinary   Musculoskeletal negative musculoskeletal ROS (+)   Abdominal   Peds negative pediatric ROS (+)  Hematology negative hematology ROS (+)   Anesthesia Other Findings   Reproductive/Obstetrics negative OB ROS                            Anesthesia Physical Anesthesia Plan  ASA: III  Anesthesia Plan: MAC   Post-op Pain Management:    Induction: Intravenous  PONV Risk Score and Plan: 2 and Propofol infusion and Treatment may vary due to age or medical condition  Airway Management Planned: Simple Face Mask  Additional Equipment:   Intra-op Plan:   Post-operative Plan:   Informed Consent: I have reviewed the patients History and Physical, chart, labs and discussed the procedure including the risks, benefits and alternatives for the proposed anesthesia with the patient or authorized representative who has indicated his/her understanding and acceptance.     Dental advisory given  Plan Discussed with: CRNA and Surgeon  Anesthesia Plan Comments:         Anesthesia Quick Evaluation

## 2021-03-10 NOTE — H&P (Signed)
Katherine Garza is an 50 y.o. female.   Chief Complaint: Treatment of esophageal varices HPI: 50 year old female, history of cirrhosis, history of hepatitis C, treated in 2016 had an EGD in 12/21 with banding of esophageal varices and is here for repeat treatment.  Past Medical History:  Diagnosis Date  . Diabetes mellitus without complication ([place])   . Hepatitis C virus infection 01/04/2015  . History of malaria    Multiple episodes w/ 2 hospitalizations  . Hypertension     Past Surgical History:  Procedure Laterality Date  . COLONOSCOPY WITH PROPOFOL N/A 12/06/2020   Procedure: COLONOSCOPY WITH PROPOFOL;  Surgeon: Ronnette Juniper, MD;  Location: [facility];  Service: Gastroenterology;  Laterality: N/A;  . ESOPHAGEAL BANDING  12/06/2020   Procedure: ESOPHAGEAL BANDING;  Surgeon: Ronnette Juniper, MD;  Location: [facility];  Service: Gastroenterology;;  . ESOPHAGOGASTRODUODENOSCOPY (EGD) WITH PROPOFOL N/A 12/06/2020   Procedure: ESOPHAGOGASTRODUODENOSCOPY (EGD) WITH PROPOFOL;  Surgeon: Ronnette Juniper, MD;  Location: [facility];  Service: Gastroenterology;  Laterality: N/A;  . FOOT SURGERY    . POLYPECTOMY  12/06/2020   Procedure: POLYPECTOMY;  Surgeon: Ronnette Juniper, MD;  Location: [facility];  Service: Gastroenterology;;    History reviewed. No pertinent family history. Social History:  reports that she has never smoked. She has never used smokeless tobacco. She reports that she does not drink alcohol and does not use drugs.  Allergies: No Known Allergies  Medications Prior to Admission  Medication Sig Dispense Refill  . furosemide (LASIX) 40 MG tablet Take 40 mg by mouth daily.    . Insulin Pen Needle (UNIFINE PENTIPS) 32G X 4 MM MISC Use to inject diabetes medication into skin 1 time daily, every day. Diag Code E11.9. insulin dependent 100 each 1  . SOLIQUA 100-33 UNT-MCG/ML SOPN INJECT 15 UNITS INTO THE SKIN DAILY. 3 mL 0  . nadolol (CORGARD) 40 MG tablet Take 1 tablet (40 mg  total) by mouth daily. 30 tablet 1    No results found for this or any previous visit (from the past 48 hour(s)). No results found.  Review of Systems  Constitutional: Negative.   HENT: Negative.   Eyes: Negative.   Cardiovascular: Negative.   Gastrointestinal: Negative.   Musculoskeletal: Negative.   Hematological: Negative.     Blood pressure 129/67, temperature 98.4 F (36.9 C), temperature source Oral, resp. rate 17, height 5\' 3"  (1.6 m), weight 90.7 kg, SpO2 94 %. Physical Exam Constitutional:      Appearance: Normal appearance.  HENT:     Head: Normocephalic and atraumatic.     Mouth/Throat:     Mouth: Mucous membranes are moist.  Eyes:     Conjunctiva/sclera: Conjunctivae normal.  Cardiovascular:     Rate and Rhythm: Normal rate and regular rhythm.  Pulmonary:     Effort: Pulmonary effort is normal.  Abdominal:     Palpations: Abdomen is soft.  Skin:    General: Skin is warm.  Neurological:     General: No focal deficit present.     Mental Status: She is alert and oriented to person, place, and time.  Psychiatric:        Mood and Affect: Mood normal.        Behavior: Behavior normal.      Assessment/Plan Esophageal varices EGD with banding. The risks and the benefits of the procedure were discussed with the patient in details, she understands and verbalizes consent.  Ronnette Juniper, MD 03/10/2021, 12:13 PM

## 2021-03-10 NOTE — Brief Op Note (Signed)
03/10/2021  1:32 PM  PATIENT:  Katherine Garza  50 y.o. female  PRE-OPERATIVE DIAGNOSIS:  Esophageal varices  POST-OPERATIVE DIAGNOSIS:  grade 3 varices w/ 3 bands placed  PROCEDURE:  Procedure(s): ESOPHAGOGASTRODUODENOSCOPY (EGD) WITH PROPOFOL (N/A) GASTRIC VARICES BANDING (N/A)  SURGEON:  Surgeon(s) and Role:    Ronnette Juniper, MD - Primary  PHYSICIAN ASSISTANT:   ASSISTANTS: Gregary Cromer, Myrlene Broker Proctor,Tech  ANESTHESIA:   MAC  EBL:  none  BLOOD ADMINISTERED:none  DRAINS: none   LOCAL MEDICATIONS USED:  NONE  SPECIMEN:  No Specimen  DISPOSITION OF SPECIMEN:  N/A  COUNTS:  YES  TOURNIQUET:  * No tourniquets in log *  DICTATION: .Dragon Dictation  PLAN OF CARE: Discharge to home after PACU  PATIENT DISPOSITION:  PACU - hemodynamically stable.   Delay start of Pharmacological VTE agent (>24hrs) due to surgical blood loss or risk of bleeding: not applicable

## 2021-03-10 NOTE — Anesthesia Postprocedure Evaluation (Signed)
Anesthesia Post Note  Patient: Katherine Garza  Procedure(s) Performed: ESOPHAGOGASTRODUODENOSCOPY (EGD) WITH PROPOFOL (N/A ) GASTRIC VARICES BANDING (N/A )     Patient location during evaluation: PACU Anesthesia Type: MAC Level of consciousness: awake and alert Pain management: pain level controlled Vital Signs Assessment: post-procedure vital signs reviewed and stable Respiratory status: spontaneous breathing, nonlabored ventilation, respiratory function stable and patient connected to nasal cannula oxygen Cardiovascular status: stable and blood pressure returned to baseline Postop Assessment: no apparent nausea or vomiting Anesthetic complications: no   No complications documented.  Last Vitals:  Vitals:   03/10/21 1159 03/10/21 1255  BP: 129/67 (!) 143/67  Pulse:  79  Resp: 17 (!) 26  Temp: 36.9 C 36.6 C  SpO2: 94% 100%    Last Pain:  Vitals:   03/10/21 1255  TempSrc: Axillary  PainSc: Asleep                 Labaron Digirolamo S

## 2021-03-10 NOTE — Op Note (Signed)
[facility] Patient Name: Katherine Garza Procedure Date: 03/10/2021 MRN: [id number] Attending MD: Ronnette Juniper , MD Date of Birth: July 18, 1971 CSN: [id number] Age: 50 Admit Type: Outpatient Procedure:                Upper GI endoscopy Indications:              For therapy of esophageal varices Providers:                Ronnette Juniper, MD, Mariana Arn, Carmie End, RN,                            Ladona Ridgel, Technician, Herbie Drape, CRNA Referring MD:             Marga Melnick Medicines:                Monitored Anesthesia Care Complications:            No immediate complications. Estimated blood loss:                            None. Estimated Blood Loss:     Estimated blood loss: none. Procedure:                Pre-Anesthesia Assessment:                           - Prior to the procedure, a History and Physical                            was performed, and patient medications and                            allergies were reviewed. The patient's tolerance of                            previous anesthesia was also reviewed. The risks                            and benefits of the procedure and the sedation                            options and risks were discussed with the patient.                            All questions were answered, and informed consent                            was obtained. Prior Anticoagulants: The patient has                            taken no previous anticoagulant or antiplatelet                            agents. ASA Grade Assessment: III - A patient with  severe systemic disease. After reviewing the risks                            and benefits, the patient was deemed in                            satisfactory condition to undergo the procedure.                           After obtaining informed consent, the endoscope was                            passed under direct vision. Throughout the                             procedure, the patient's blood pressure, pulse, and                            oxygen saturations were monitored continuously. The                            [id number] Olympus ([id number]-[id number]) was introduced through                            the mouth, and advanced to the second part of                            duodenum. The upper GI endoscopy was accomplished                            without difficulty. The patient tolerated the                            procedure well. Scope In: Scope Out: Findings:      Three columns of Grade III varices were found in the middle third of the       esophagus. They were medium in size. Three bands were successfully       placed with complete eradication, resulting in deflation of varices.       There was no bleeding during and at the end of the procedure.      The Z-line was regular.      The cardia and gastric fundus were normal on retroflexion.      The examined duodenum was normal.      Mild portal hypertensive gastropathy was found in the entire examined       stomach. Impression:               - Grade III esophageal varices. Completely                            eradicated. Banded.                           - Z-line regular.                           -  Normal examined duodenum.                           - Portal hypertensive gastropathy.                           - No specimens collected. Moderate Sedation:      Patient did not receive moderate sedation for this procedure, but       instead received monitored anesthesia care. Recommendation:           - Patient has a contact number available for                            emergencies. The signs and symptoms of potential                            delayed complications were discussed with the                            patient. Return to normal activities tomorrow.                            Written discharge instructions were provided to the                            patient.                            - Clear liquid diet for 6 hours, advance as                            tolerated thereafter.                           - Repeat upper endoscopy in 6 months for                            retreatment. Procedure Code(s):        --- Professional ---                           [phone], Esophagogastroduodenoscopy, flexible,                            transoral; with band ligation of esophageal/gastric                            varices Diagnosis Code(s):        --- Professional ---                           I85.00, Esophageal varices without bleeding                           K76.6, Portal hypertension                           K31.89,  Other diseases of stomach and duodenum CPT copyright 2019 American Medical Association. All rights reserved. The codes documented in this report are preliminary and upon coder review may  be revised to meet current compliance requirements. Ronnette Juniper, MD 03/10/2021 12:56:03 PM This report has been signed electronically. Number of Addenda: 0

## 2021-03-10 NOTE — Transfer of Care (Signed)
Immediate Anesthesia Transfer of Care Note  Patient: Katherine Garza  Procedure(s) Performed: ESOPHAGOGASTRODUODENOSCOPY (EGD) WITH PROPOFOL (N/A ) GASTRIC VARICES BANDING (N/A )  Patient Location: PACU  Anesthesia Type:MAC  Level of Consciousness: sedated, patient cooperative and responds to stimulation  Airway & Oxygen Therapy: Patient Spontanous Breathing and Patient connected to face mask oxygen  Post-op Assessment: Report given to RN and Post -op Vital signs reviewed and stable  Post vital signs: Reviewed and stable  Last Vitals:  Vitals Value Taken Time  BP    Temp    Pulse 78 03/10/21 1256  Resp 23 03/10/21 1256  SpO2 100 % 03/10/21 1256  Vitals shown include unvalidated device data.  Last Pain:  Vitals:   03/10/21 1159  TempSrc: Oral  PainSc: 0-No pain         Complications: No complications documented.

## 2021-03-10 NOTE — Discharge Instructions (Signed)

## 2021-03-11 ENCOUNTER — Encounter (HOSPITAL_COMMUNITY): Payer: Self-pay | Admitting: Gastroenterology

## 2021-03-12 ENCOUNTER — Other Ambulatory Visit (HOSPITAL_COMMUNITY): Payer: Self-pay

## 2021-03-12 MED FILL — Insulin Glargine-Lixisenatide Sol Pen-Inj 100-33 Unit-MCG/ML: SUBCUTANEOUS | 28 days supply | Qty: 3 | Fill #0 | Status: AC

## 2021-03-25 ENCOUNTER — Other Ambulatory Visit (HOSPITAL_COMMUNITY): Payer: Self-pay

## 2021-03-25 ENCOUNTER — Other Ambulatory Visit: Payer: Self-pay | Admitting: Internal Medicine

## 2021-03-25 DIAGNOSIS — E1165 Type 2 diabetes mellitus with hyperglycemia: Secondary | ICD-10-CM

## 2021-03-28 ENCOUNTER — Other Ambulatory Visit (HOSPITAL_COMMUNITY): Payer: Self-pay

## 2021-03-28 ENCOUNTER — Other Ambulatory Visit: Payer: Self-pay | Admitting: Internal Medicine

## 2021-03-28 DIAGNOSIS — E1165 Type 2 diabetes mellitus with hyperglycemia: Secondary | ICD-10-CM

## 2021-03-29 ENCOUNTER — Other Ambulatory Visit (HOSPITAL_COMMUNITY): Payer: Self-pay

## 2021-03-29 MED ORDER — SOLIQUA 100-33 UNT-MCG/ML ~~LOC~~ SOPN
15.0000 [IU] | PEN_INJECTOR | Freq: Every day | SUBCUTANEOUS | 0 refills | Status: DC
  Filled 2021-03-29 – 2021-04-12 (×3): qty 3, 20d supply, fill #0

## 2021-03-30 ENCOUNTER — Other Ambulatory Visit (HOSPITAL_COMMUNITY): Payer: Self-pay

## 2021-04-01 ENCOUNTER — Other Ambulatory Visit (HOSPITAL_COMMUNITY): Payer: Self-pay

## 2021-04-01 ENCOUNTER — Ambulatory Visit
Admission: RE | Admit: 2021-04-01 | Discharge: 2021-04-01 | Disposition: A | Discharge status: Home / Self Care | Payer: BC Managed Care – PPO | Source: Ambulatory Visit | Attending: Nurse Practitioner | Admitting: Nurse Practitioner

## 2021-04-01 DIAGNOSIS — K7469 Other cirrhosis of liver: Secondary | ICD-10-CM

## 2021-04-04 ENCOUNTER — Other Ambulatory Visit (HOSPITAL_COMMUNITY): Payer: Self-pay

## 2021-04-08 ENCOUNTER — Encounter: Payer: BC Managed Care – PPO | Admitting: Internal Medicine

## 2021-04-12 ENCOUNTER — Other Ambulatory Visit (HOSPITAL_COMMUNITY): Payer: Self-pay

## 2021-04-14 ENCOUNTER — Other Ambulatory Visit (HOSPITAL_COMMUNITY): Payer: Self-pay

## 2021-05-11 ENCOUNTER — Other Ambulatory Visit (HOSPITAL_COMMUNITY): Payer: Self-pay

## 2021-05-11 ENCOUNTER — Other Ambulatory Visit: Payer: Self-pay | Admitting: Internal Medicine

## 2021-05-11 DIAGNOSIS — E1165 Type 2 diabetes mellitus with hyperglycemia: Secondary | ICD-10-CM

## 2021-05-12 ENCOUNTER — Other Ambulatory Visit (HOSPITAL_COMMUNITY): Payer: Self-pay

## 2021-05-12 MED ORDER — SOLIQUA 100-33 UNT-MCG/ML ~~LOC~~ SOPN
15.0000 [IU] | PEN_INJECTOR | Freq: Every day | SUBCUTANEOUS | 0 refills | Status: DC
  Filled 2021-05-12: qty 3, 20d supply, fill #0

## 2021-05-15 ENCOUNTER — Encounter: Payer: Self-pay | Admitting: *Deleted

## 2021-06-14 ENCOUNTER — Telehealth: Payer: Self-pay | Admitting: *Deleted

## 2021-06-14 ENCOUNTER — Encounter: Payer: Self-pay | Admitting: *Deleted

## 2021-06-14 NOTE — Telephone Encounter (Signed)
Patient called in with son. States patient was seen at home by EMS 2 days ago and was told she had a cold and instructed to take acetaminophen. Patient continues with H/A, body aches, feels warm, few chills. Denies cough, SHOB. Patient received 2 Pfizer vaccines for Covid in 2021. Instructed to call [facility] [phone] to schedule Covid test. Advised comfort measures: rest, drink plenty of fluids, especially water, continue taking acetaminophen q 4-6 hours prn pain. If test is negative, son will call back to schedule appt.

## 2021-06-15 ENCOUNTER — Ambulatory Visit (INDEPENDENT_AMBULATORY_CARE_PROVIDER_SITE_OTHER): Payer: BC Managed Care – PPO | Admitting: Student

## 2021-06-15 ENCOUNTER — Other Ambulatory Visit (HOSPITAL_COMMUNITY): Payer: Self-pay

## 2021-06-15 ENCOUNTER — Other Ambulatory Visit: Payer: Self-pay | Admitting: Internal Medicine

## 2021-06-15 ENCOUNTER — Other Ambulatory Visit: Payer: Self-pay | Admitting: Student

## 2021-06-15 DIAGNOSIS — R519 Headache, unspecified: Secondary | ICD-10-CM | POA: Diagnosis not present

## 2021-06-15 DIAGNOSIS — M791 Myalgia, unspecified site: Secondary | ICD-10-CM | POA: Diagnosis not present

## 2021-06-15 DIAGNOSIS — E1165 Type 2 diabetes mellitus with hyperglycemia: Secondary | ICD-10-CM

## 2021-06-15 DIAGNOSIS — Z608 Other problems related to social environment: Secondary | ICD-10-CM

## 2021-06-15 NOTE — Progress Notes (Signed)
  [facility] Internal Medicine Residency Telephone Encounter Continuity Care Appointment  HPI:  This telephone encounter was created for Ms. Deretha Goettl on 06/15/2021 for the following purpose/cc myalgias, headache.   Past Medical History:  Past Medical History:  Diagnosis Date   Diabetes mellitus without complication ([place])    Hepatitis C virus infection 01/04/2015   History of malaria    Multiple episodes w/ 2 hospitalizations   Hypertension      ROS:  As per HPI   Assessment / Plan / Recommendations:  Please see A&P under problem oriented charting for assessment of the patient's acute and chronic medical conditions.  As always, pt is advised that if symptoms worsen or new symptoms arise, they should go to an urgent care facility or to to ER for further evaluation.   Consent and Medical Decision Making:  Patient discussed with Dr. Daryll Drown This is a telephone encounter between [facility] and Sanjuan Dame on 06/15/2021 for myalgias, headaches. The visit was conducted with the patient located at home and Sanjuan Dame at [facility]. The patient's identity was confirmed using their DOB and current address. The patient has consented to being evaluated through a telephone encounter and understands the associated risks (an examination cannot be done and the patient may need to come in for an appointment) / benefits (allows the patient to remain at home, decreasing exposure to coronavirus). I personally spent 14 minutes on medical discussion using [place] [phone].

## 2021-06-15 NOTE — Assessment & Plan Note (Addendum)
Katherine Garza presents via telephone to discuss recent symptoms of myalgias and headaches. She reports mild improvement of these symptoms since onset three days ago. Patient also reports mild chills, but otherwise no objective fevers. Notes that she has been eating and drinking and trying to keep herself hydrated. Denies cough, dyspnea, congestion, nausea, vomiting, sick contacts. She was initially scheduled to get a COVID-19 test yesterday, but did not make it to the appointment. She has been vaccinated x2 for COVID-19. Of note, others were heard in the background of the call.  Appears Katherine Garza has a viral illness of unknown etiology. I encouraged her to get a COVID-19 test and gave her the number to schedule testing. I also encouraged her to isolate from others as much as possible, including wearing a mask inside the house while around others. Also mentioned that Katherine Garza should continue hydrating herself as much as possible. Explained that most likely the illness will be self-limiting and symptoms will continue to improve over the next few days. I encouraged her to come to clinic if symptoms continue to persist or worsen.  - Schedule COVID-19 testing - Continue hydrating - Return to clinic if symptoms persist or worsen

## 2021-06-15 NOTE — Progress Notes (Signed)
Attempted telehealth visit at 2:05PM using [employer] [phone]. Using the interpreter, I have left a voicemail for Ms. Credit to call the clinic back to discuss her recent symptoms.

## 2021-06-17 ENCOUNTER — Other Ambulatory Visit (HOSPITAL_COMMUNITY): Payer: Self-pay

## 2021-06-17 MED ORDER — SOLIQUA 100-33 UNT-MCG/ML ~~LOC~~ SOPN
15.0000 [IU] | PEN_INJECTOR | Freq: Every day | SUBCUTANEOUS | 0 refills | Status: DC
  Filled 2021-06-17: qty 3, 20d supply, fill #0

## 2021-06-17 MED ORDER — UNIFINE PENTIPS 32G X 4 MM MISC
1.0000 | Freq: Every day | 1 refills | Status: DC
  Filled 2021-06-17 – 2021-07-21 (×4): qty 100, 30d supply, fill #0
  Filled 2021-11-24: qty 100, 30d supply, fill #1

## 2021-06-23 NOTE — Progress Notes (Signed)
Internal Medicine Clinic Attending  Case discussed with Dr. Braswell  At the time of the visit.  We reviewed the resident's history and pertinent patient test results.  I agree with the assessment, diagnosis, and plan of care documented in the resident's note.  

## 2021-07-05 ENCOUNTER — Other Ambulatory Visit: Payer: Self-pay

## 2021-07-05 ENCOUNTER — Encounter: Payer: Self-pay | Admitting: Internal Medicine

## 2021-07-05 ENCOUNTER — Ambulatory Visit: Payer: BC Managed Care – PPO | Admitting: Internal Medicine

## 2021-07-05 VITALS — BP 123/66 | HR 63 | Temp 98.0°F | Wt 216.2 lb

## 2021-07-05 DIAGNOSIS — R600 Localized edema: Secondary | ICD-10-CM

## 2021-07-05 DIAGNOSIS — E785 Hyperlipidemia, unspecified: Secondary | ICD-10-CM | POA: Diagnosis not present

## 2021-07-05 DIAGNOSIS — M791 Myalgia, unspecified site: Secondary | ICD-10-CM

## 2021-07-05 DIAGNOSIS — E1165 Type 2 diabetes mellitus with hyperglycemia: Secondary | ICD-10-CM

## 2021-07-05 LAB — POCT GLYCOSYLATED HEMOGLOBIN (HGB A1C): Hemoglobin A1C: 8.2 % — AB (ref 4.0–5.6)

## 2021-07-05 LAB — GLUCOSE, CAPILLARY: Glucose-Capillary: 114 mg/dL — ABNORMAL HIGH (ref 70–99)

## 2021-07-05 NOTE — Patient Instructions (Addendum)
Ms.Marilena Velis, it was a pleasure seeing you today!  Today we discussed: Diabetes: we will repeat blood work to see how you are doing in regard to your blood sugar. I will call and let you know the results of that.  I also will be checking your cholesterol level.  We also chatted about going to the eye doctor, we can revisit that at our next visit as well.    I have ordered the following labs today:  Lab Orders  Lipid Profile  Microalbumin / Creatinine Urine Ratio  POC Hbg A1C     Tests ordered today:  Lipid panel, A1c, microalbumin  Referrals ordered today:   Referral Orders  No referral(s) requested today     I have ordered the following medication/changed the following medications:   Stop the following medications: There are no discontinued medications.   Start the following medications: No orders of the defined types were placed in this encounter.    Follow-up: 2-3 months   Please make sure to arrive 15 minutes prior to your next appointment. If you arrive late, you may be asked to reschedule.   We look forward to seeing you next time. Please call our clinic at [phone] if you have any questions or concerns. The best time to call is Monday-Friday from 9am-4pm, but there is someone available 24/7. If after hours or the weekend, call the main hospital number and ask for the Internal Medicine Resident On-Call. If you need medication refills, please notify your pharmacy one week in advance and they will send [place] a request.  Thank you for letting us take part in your care. Wishing you the best!  Thank you, Dr. Howie Ill

## 2021-07-05 NOTE — Assessment & Plan Note (Signed)
Medications: Soliqua 15 units daily   Last A1C:13.3 05/2020 Patient states that she checks her blood glucose once a day around 10 am before breakfast.  It runs between 130-195.  At this time, patient is not interested in ophthalmology referral or retinal scanner.  She states that she has a lot of doctors already and does not want to add more. Plan: -repeat A1c -Lipid panel and urine microalbumin/creatinine ratio -will revisit retinal scanner or opthalmology referral at next visit in 3 months

## 2021-07-05 NOTE — Assessment & Plan Note (Signed)
Patient presents following episode of myalgias and headache at the beginning of July.  At that time she has mild chills with the muscle pains.  No known sick contacts.  She was advised to be tested for COVID, as well as continuing hydrating.  Since that episode, her symptoms have resolved.  She did not end up going to be tested for COVID.

## 2021-07-05 NOTE — Progress Notes (Signed)
   CC: follow-up after episode of myalgia and headache  HPI:  [name] is a 50 y.o. with medical history of Diabetes Mellitus, Hepatic cirrhosis, and Hypertension presenting to [facility] for follow-up after episode of myalgia and headache.   Please see problem-based list for further details, assessments, and plans.  Past Medical History:  Diagnosis Date   Diabetes mellitus without complication ([place])    Hepatitis C virus infection 01/04/2015   History of malaria    Multiple episodes w/ 2 hospitalizations   Hypertension    Review of Systems:  As per HPI  Physical Exam:  Vitals:   07/05/21 1042  Weight: 216 lb 3.2 oz (98.1 kg)    General: well-developed, obese woman in no acute distress with an interpreter present HENT: NCAT, no scars noted Eyes: no scleral icterus, conjunctiva clear CV: No murmurs, normal rate Pulm: CTAB, pulmonary effort normal GI: No tenderness present, bowel sounds normal MSK: 1+ pitting edema in lower extremities bilaterally, pulses diminished at right and left posterior tibialis and dorsalis pedis, callus present on left metatarsal medially Skin: Hyperkeratosis in bilateral lower extremities Neuro: sensation intact in bilateral lower extremities Psych: Normal mood and affect  Assessment & Plan:   See Encounters Tab for problem based charting.  Patient seen with Dr.  Jimmye Norman

## 2021-07-05 NOTE — Assessment & Plan Note (Addendum)
Patient has chronic lower extremity edema secondary to hepatic cirrhosis, she follows with hepatology.  She is prescribed Lasix 40 mg daily.  Endorses pain on occasion in lower part of legs R>L.  This pain is sharp. Occurs when she has been sitting for a long time and then gets up to move around. -Continue Lasix 40 mg daily.

## 2021-07-06 LAB — MICROALBUMIN / CREATININE URINE RATIO
Creatinine, Urine: 190.2 mg/dL
Microalb/Creat Ratio: 15 mg/g creat (ref 0–29)
Microalbumin, Urine: 29.4 ug/mL

## 2021-07-06 LAB — LIPID PANEL
Chol/HDL Ratio: 4.6 ratio — ABNORMAL HIGH (ref 0.0–4.4)
Cholesterol, Total: 165 mg/dL (ref 100–199)
HDL: 36 mg/dL — ABNORMAL LOW (ref >39)
LDL Chol Calc (NIH): 115 mg/dL — ABNORMAL HIGH (ref 0–99)
Triglycerides: 72 mg/dL (ref 0–149)
VLDL Cholesterol Cal: 14 mg/dL (ref 5–40)

## 2021-07-06 NOTE — Addendum Note (Signed)
Addended by: Edwyna Perfect on: 07/06/2021 03:09 PM   Modules accepted: Orders

## 2021-07-08 DIAGNOSIS — E785 Hyperlipidemia, unspecified: Secondary | ICD-10-CM | POA: Insufficient documentation

## 2021-07-08 NOTE — Assessment & Plan Note (Signed)
Checked lipid panel in the setting of her type 2 diabetes.  Not currently on any medications for cholesterol.  Total cholesterol= 165 LDL=115  ASCVD risk= 6.7% Plan: -called via interpretor to talk about results. Talked about lifestyle modifications to decrease cholesterol.

## 2021-07-10 NOTE — Progress Notes (Signed)
Internal Medicine Clinic Attending  I saw and evaluated the patient.  I personally confirmed the key portions of the history and exam documented by Dr. Howie Ill and I reviewed pertinent patient test results.  The assessment, diagnosis, and plan were formulated together and I agree with the documentation in the resident's note.  Patient will benefit from ongoing education and conversations about her health in setting of educational, language, and cultural differences.

## 2021-07-19 ENCOUNTER — Other Ambulatory Visit: Payer: Self-pay | Admitting: Student

## 2021-07-19 ENCOUNTER — Other Ambulatory Visit: Payer: Self-pay

## 2021-07-19 DIAGNOSIS — E1165 Type 2 diabetes mellitus with hyperglycemia: Secondary | ICD-10-CM

## 2021-07-20 ENCOUNTER — Other Ambulatory Visit: Payer: Self-pay

## 2021-07-20 MED ORDER — SOLIQUA 100-33 UNT-MCG/ML ~~LOC~~ SOPN
15.0000 [IU] | PEN_INJECTOR | Freq: Every day | SUBCUTANEOUS | 0 refills | Status: DC
  Filled 2021-07-20 – 2021-07-21 (×2): qty 3, 20d supply, fill #0

## 2021-07-21 ENCOUNTER — Other Ambulatory Visit: Payer: Self-pay

## 2021-07-21 ENCOUNTER — Other Ambulatory Visit (HOSPITAL_COMMUNITY): Payer: Self-pay

## 2021-08-18 ENCOUNTER — Other Ambulatory Visit (HOSPITAL_COMMUNITY): Payer: Self-pay

## 2021-08-18 ENCOUNTER — Other Ambulatory Visit: Payer: Self-pay | Admitting: Internal Medicine

## 2021-08-18 DIAGNOSIS — E1165 Type 2 diabetes mellitus with hyperglycemia: Secondary | ICD-10-CM

## 2021-08-18 MED ORDER — SOLIQUA 100-33 UNT-MCG/ML ~~LOC~~ SOPN
15.0000 [IU] | PEN_INJECTOR | Freq: Every day | SUBCUTANEOUS | 0 refills | Status: DC
  Filled 2021-08-18: qty 3, 20d supply, fill #0

## 2021-08-25 ENCOUNTER — Other Ambulatory Visit: Payer: Self-pay | Admitting: Gastroenterology

## 2021-09-13 ENCOUNTER — Other Ambulatory Visit: Payer: Self-pay | Admitting: Nurse Practitioner

## 2021-09-13 DIAGNOSIS — I8510 Secondary esophageal varices without bleeding: Secondary | ICD-10-CM

## 2021-09-13 DIAGNOSIS — I81 Portal vein thrombosis: Secondary | ICD-10-CM

## 2021-09-13 DIAGNOSIS — K7469 Other cirrhosis of liver: Secondary | ICD-10-CM

## 2021-09-16 ENCOUNTER — Other Ambulatory Visit (HOSPITAL_COMMUNITY): Payer: Self-pay

## 2021-09-16 ENCOUNTER — Other Ambulatory Visit: Payer: Self-pay | Admitting: Student

## 2021-09-16 DIAGNOSIS — E1165 Type 2 diabetes mellitus with hyperglycemia: Secondary | ICD-10-CM

## 2021-09-19 ENCOUNTER — Other Ambulatory Visit (HOSPITAL_COMMUNITY): Payer: Self-pay

## 2021-09-19 MED ORDER — SOLIQUA 100-33 UNT-MCG/ML ~~LOC~~ SOPN
15.0000 [IU] | PEN_INJECTOR | Freq: Every day | SUBCUTANEOUS | 11 refills | Status: DC
  Filled 2021-09-19: qty 3, 20d supply, fill #0
  Filled 2021-10-25: qty 3, 20d supply, fill #1
  Filled 2021-11-24: qty 3, 20d supply, fill #2

## 2021-09-21 ENCOUNTER — Ambulatory Visit
Admission: RE | Admit: 2021-09-21 | Discharge: 2021-09-21 | Disposition: A | Discharge status: Home / Self Care | Payer: BC Managed Care – PPO | Source: Ambulatory Visit | Attending: Nurse Practitioner | Admitting: Nurse Practitioner

## 2021-09-21 DIAGNOSIS — I8510 Secondary esophageal varices without bleeding: Secondary | ICD-10-CM

## 2021-09-21 DIAGNOSIS — K7469 Other cirrhosis of liver: Secondary | ICD-10-CM

## 2021-09-21 DIAGNOSIS — I81 Portal vein thrombosis: Secondary | ICD-10-CM

## 2021-09-22 ENCOUNTER — Other Ambulatory Visit (HOSPITAL_COMMUNITY): Payer: Self-pay

## 2021-10-17 ENCOUNTER — Encounter: Payer: BC Managed Care – PPO | Admitting: Internal Medicine

## 2021-10-17 NOTE — Progress Notes (Deleted)
Scheduled fo EGD at end of Nov. 22 to reeval esophageal varices and portal HTN gastropathy  DM Lab Results  Component Value Date   HGBA1C 8.2 (A) 07/05/2021   Had improved from 13.3% 05/2020 -A1c -ophthalmology  Lipid Lab Results  Component Value Date   CHOL 165 07/05/2021   HDL 36 (L) 07/05/2021   LDLCALC 115 (H) 07/05/2021   TRIG 72 07/05/2021   CHOLHDL 4.6 (H) 07/05/2021   ASCVD- 11.1% -start statin, Atorvastatin 40 mg   Care gaps: -Pap        last completed 07/22/18 negative PAP -Flu -Mammogram, last completed in 2020?

## 2021-10-25 ENCOUNTER — Other Ambulatory Visit (HOSPITAL_COMMUNITY): Payer: Self-pay

## 2021-10-31 NOTE — H&P (Addendum)
HPI: 50 year old female with cirrhosis EGD from 3/22: grade 3 varices, 3 bands placed, portal hypertensive gastropathy  Current Medications  Taking  Soliqua(Insulin Glargine-Lixisenatide) 100-33 UNT-MCG/ML Solution Pen-injector as directed Subcutaneous  Furosemide 40 MG Tablet 1 tablet Orally Once a day Nadolol 40 MG Tablet 1 tablet Orally Once a day  Medication List reviewed and reconciled with the patient    Past Medical History       Tinea corporis.      Headache.      Reflux.      Cirrhosis of liver not due to alcohol.      Hyperglycemia.      Heartburn.      Hepatitis C.      Esophageal varices.      Non-alcoholic micronodular.      Cirrhosis of liver without ascites unspecified hepatic cirrhosis type.      Other cirrhosis of liver.      Pre-diabetic.   Surgical History        EGD 08/23/15, 11/2020        Left foot surgery2021        Colonoscopy 11/2020     Family History  Father: deceased  Mother: deceased  No family hx of colon cancer, polyps or liver disease.    Social History  General:   Tobacco use      cigarettes: Never smoked    Tobacco history last updated 10/28/2020    Vaping No no EXPOSURE TO PASSIVE SMOKE.  no Alcohol.  no Recreational drug use.  no Smoking.     Hospitalization/Major Diagnostic Procedure  not in the past year 02/2021     Review of Systems  GI PROCEDURE:         no Pacemaker/ AICD, no. no Artificial heart valves. no MI/heart attack. no Abnormal heart rhythm. no Angina. no CVA. no Hypertension. no Hypotension. no Asthma, COPD. no Sleep apnea. no Seizure disorders. no Artificial joints. no Severe DJD. no Diabetes. no Significant headaches. no Vertigo. no Depression/anxiety. no Abnormal bleeding. no Kidney Disease. Liver disease  yes, YES. no Blood transfusion.        Vital Signs  Wt 217.6, Ht 64, BMI 37.35.  Examination  Gastroenterology::       GENERAL APPEARANCE: Well developed,overweight, no active distress, pleasant.         SCLERA: anicteric.        CARDIOVASCULAR Normal RRR .        RESPIRATORY Breath sounds normal. Respiration even and unlabored.        ABDOMEN No masses palpated. Liver and spleen not palpated, normal. Bowel sounds normal, Abdomen not distended.        EXTREMITIES: No edema.        NEURO: alert, oriented to time, place and person, normal gait.        PSYCH: mood/affect normal.     Assessment plan Cirrhosis with esophageal varices EGD with banding

## 2021-10-31 NOTE — Anesthesia Preprocedure Evaluation (Addendum)
Anesthesia Evaluation  Patient identified by MRN, date of birth, ID band Patient awake    Reviewed: Allergy & Precautions, NPO status , Patient's Chart, lab work & pertinent test results  Airway Mallampati: II  TM Distance: >3 FB Neck ROM: Full    Dental no notable dental hx. (+) Teeth Intact, Dental Advisory Given   Pulmonary neg pulmonary ROS,    Pulmonary exam normal breath sounds clear to auscultation       Cardiovascular hypertension, Normal cardiovascular exam Rhythm:Regular Rate:Normal     Neuro/Psych negative neurological ROS     GI/Hepatic (+) Cirrhosis   Esophageal Varices    , Hepatitis -  Endo/Other  diabetes  Renal/GU   negative genitourinary   Musculoskeletal negative musculoskeletal ROS (+)   Abdominal   Peds  Hematology  (+) anemia ,   Anesthesia Other Findings   Reproductive/Obstetrics                            Anesthesia Physical Anesthesia Plan  ASA: 3  Anesthesia Plan: MAC   Post-op Pain Management: Minimal or no pain anticipated   Induction: Intravenous  PONV Risk Score and Plan: Treatment may vary due to age or medical condition  Airway Management Planned: Natural Airway and Simple Face Mask  Additional Equipment: None  Intra-op Plan:   Post-operative Plan:   Informed Consent: I have reviewed the patients History and Physical, chart, labs and discussed the procedure including the risks, benefits and alternatives for the proposed anesthesia with the patient or authorized representative who has indicated his/her understanding and acceptance.     Dental advisory given and Interpreter used for interveiw  Plan Discussed with:   Anesthesia Plan Comments: ([place] interpreter needed  MAC)       Anesthesia Quick Evaluation

## 2021-11-01 ENCOUNTER — Ambulatory Visit (HOSPITAL_COMMUNITY): Payer: BC Managed Care – PPO | Admitting: Anesthesiology

## 2021-11-01 ENCOUNTER — Encounter (HOSPITAL_COMMUNITY): Payer: Self-pay | Admitting: Gastroenterology

## 2021-11-01 ENCOUNTER — Ambulatory Visit (HOSPITAL_COMMUNITY)
Admission: RE | Admit: 2021-11-01 | Discharge: 2021-11-01 | Disposition: A | Discharge status: Home / Self Care | Payer: BC Managed Care – PPO | Source: Ambulatory Visit | Attending: Gastroenterology | Admitting: Gastroenterology

## 2021-11-01 ENCOUNTER — Telehealth: Payer: Self-pay | Admitting: *Deleted

## 2021-11-01 ENCOUNTER — Encounter (HOSPITAL_COMMUNITY)
Admission: RE | Disposition: A | Discharge status: Home / Self Care | Payer: Self-pay | Source: Ambulatory Visit | Attending: Gastroenterology

## 2021-11-01 DIAGNOSIS — K259 Gastric ulcer, unspecified as acute or chronic, without hemorrhage or perforation: Secondary | ICD-10-CM | POA: Diagnosis not present

## 2021-11-01 DIAGNOSIS — I8510 Secondary esophageal varices without bleeding: Secondary | ICD-10-CM | POA: Insufficient documentation

## 2021-11-01 DIAGNOSIS — K3189 Other diseases of stomach and duodenum: Secondary | ICD-10-CM | POA: Diagnosis not present

## 2021-11-01 DIAGNOSIS — K7469 Other cirrhosis of liver: Secondary | ICD-10-CM | POA: Insufficient documentation

## 2021-11-01 DIAGNOSIS — K766 Portal hypertension: Secondary | ICD-10-CM | POA: Insufficient documentation

## 2021-11-01 HISTORY — PX: BIOPSY: SHX5522

## 2021-11-01 HISTORY — PX: ESOPHAGOGASTRODUODENOSCOPY (EGD) WITH PROPOFOL: SHX5813

## 2021-11-01 LAB — GLUCOSE, CAPILLARY: Glucose-Capillary: 157 mg/dL — ABNORMAL HIGH (ref 70–99)

## 2021-11-01 SURGERY — ESOPHAGOGASTRODUODENOSCOPY (EGD) WITH PROPOFOL
Anesthesia: Monitor Anesthesia Care

## 2021-11-01 MED ORDER — LACTATED RINGERS IV SOLN
INTRAVENOUS | Status: DC
  Administered 2021-11-01: 1000 mL via INTRAVENOUS

## 2021-11-01 MED ORDER — PROPOFOL 10 MG/ML IV BOLUS
INTRAVENOUS | Status: AC
  Filled 2021-11-01: qty 20

## 2021-11-01 MED ORDER — PANTOPRAZOLE SODIUM 40 MG PO TBEC: 40.0000 mg | DELAYED_RELEASE_TABLET | Freq: Every day | ORAL | 1 refills | Status: DC

## 2021-11-01 MED ORDER — PROPOFOL 500 MG/50ML IV EMUL
INTRAVENOUS | Status: DC | PRN
  Administered 2021-11-01: 150 ug/kg/min via INTRAVENOUS

## 2021-11-01 MED ORDER — SODIUM CHLORIDE 0.9 % IV SOLN: INTRAVENOUS | Status: DC

## 2021-11-01 SURGICAL SUPPLY — 14 items

## 2021-11-01 NOTE — Transfer of Care (Signed)
Immediate Anesthesia Transfer of Care Note  Patient: Katherine Garza  Procedure(s) Performed: ESOPHAGOGASTRODUODENOSCOPY (EGD) WITH PROPOFOL BIOPSY  Patient Location: Endoscopy Unit  Anesthesia Type:MAC  Level of Consciousness: drowsy  Airway & Oxygen Therapy: Patient Spontanous Breathing and Patient connected to face mask oxygen  Post-op Assessment: Report given to RN and Post -op Vital signs reviewed and stable  Post vital signs: Reviewed and stable  Last Vitals:  Vitals Value Taken Time  BP    Temp    Pulse    Resp    SpO2      Last Pain:  Vitals:   11/01/21 0909  TempSrc: Oral  PainSc: 0-No pain         Complications: No notable events documented.

## 2021-11-01 NOTE — Anesthesia Postprocedure Evaluation (Signed)
Anesthesia Post Note  Patient: Tennie Dormer  Procedure(s) Performed: ESOPHAGOGASTRODUODENOSCOPY (EGD) WITH PROPOFOL BIOPSY     Patient location during evaluation: PACU Anesthesia Type: MAC Level of consciousness: awake and alert Pain management: pain level controlled Vital Signs Assessment: post-procedure vital signs reviewed and stable Respiratory status: spontaneous breathing, nonlabored ventilation, respiratory function stable and patient connected to nasal cannula oxygen Cardiovascular status: stable and blood pressure returned to baseline Postop Assessment: no apparent nausea or vomiting Anesthetic complications: no   No notable events documented.  Last Vitals:  Vitals:   11/01/21 0945 11/01/21 0955  BP: 112/61 (!) 142/85  Pulse: 97 64  Resp: 15 18  Temp: (!) 36.4 C   SpO2: 97% 100%    Last Pain:  Vitals:   11/01/21 0955  TempSrc:   PainSc: 0-No pain                 Barnet Glasgow

## 2021-11-01 NOTE — Telephone Encounter (Signed)
Received fax lab report frm Katherine Garza at [facility].  Did Hepatic Function Panel, PT/INR, CBC, Alpha FetoProtein Tumor Marker...  Recommend follow up on WBC and Platelet Count.  Handed to PCP for review and signature.   Last office visit was 07/05/2021 with 3 month return request and patient no showed on 10/17/2021.  Will route to front office to schedule with PCP.

## 2021-11-01 NOTE — Discharge Instructions (Signed)

## 2021-11-01 NOTE — Op Note (Signed)
[facility] Patient Name: Katherine Garza Procedure Date: 11/01/2021 MRN: [id number] Attending MD: Ronnette Juniper , MD Date of Birth: 1971/10/06 CSN: [id number] Age: 50 Admit Type: Outpatient Procedure:                Upper GI endoscopy Indications:              Follow-up of esophageal varices Providers:                Ronnette Juniper, MD, Dulcy Fanny, Benetta Spar,                            Technician Referring MD:             Erick Colace Medicines:                Monitored Anesthesia Care Complications:            No immediate complications. Estimated blood loss:                            Minimal. Estimated Blood Loss:     Estimated blood loss was minimal. Procedure:                Pre-Anesthesia Assessment:                           - Prior to the procedure, a History and Physical                            was performed, and patient medications and                            allergies were reviewed. The patient's tolerance of                            previous anesthesia was also reviewed. The risks                            and benefits of the procedure and the sedation                            options and risks were discussed with the patient.                            All questions were answered, and informed consent                            was obtained. Prior Anticoagulants: The patient has                            taken no previous anticoagulant or antiplatelet                            agents. ASA Grade Assessment: III - A patient with                            severe  systemic disease. After reviewing the risks                            and benefits, the patient was deemed in                            satisfactory condition to undergo the procedure.                           After obtaining informed consent, the endoscope was                            passed under direct vision. Throughout the                            procedure,  the patient's blood pressure, pulse, and                            oxygen saturations were monitored continuously. The                            [id number]-H190 ([id number]) Olympus endoscope was introduced                            through the mouth, and advanced to the second part                            of duodenum. The upper GI endoscopy was                            accomplished without difficulty. The patient                            tolerated the procedure well. Scope In: Scope Out: Findings:      The upper third of the esophagus and middle third of the esophagus were       normal. Scarring from prior banding was noted.      Grade I varices were found in the lower third of the esophagus. They       were small in size.      The Z-line was regular and was found 40 cm from the incisors.      Few non-bleeding linear and superficial gastric ulcers with a clean       ulcer base (Forrest Class III) were found in the gastric antrum.       Biopsies were taken with a cold forceps for Helicobacter pylori testing.      The cardia and gastric fundus were normal on retroflexion.      The examined duodenum was normal. Impression:               - Normal upper third of esophagus and middle third                            of esophagus.                           -  Grade I esophageal varices.                           - Z-line regular, 40 cm from the incisors.                           - Non-bleeding gastric ulcers with a clean ulcer                            base (Forrest Class III). Biopsied.                           - Normal examined duodenum. Moderate Sedation:      Patient did not receive moderate sedation for this procedure, but       instead received monitored anesthesia care. Recommendation:           - Resume regular diet.                           - Continue present medications.                           - Await pathology results.                           - Repeat upper endoscopy in  1 year for surveillance. Procedure Code(s):        --- Professional ---                           [phone], Esophagogastroduodenoscopy, flexible,                            transoral; with biopsy, single or multiple Diagnosis Code(s):        --- Professional ---                           I85.00, Esophageal varices without bleeding                           K25.9, Gastric ulcer, unspecified as acute or                            chronic, without hemorrhage or perforation CPT copyright 2019 American Medical Association. All rights reserved. The codes documented in this report are preliminary and upon coder review may  be revised to meet current compliance requirements. Ronnette Juniper, MD 11/01/2021 9:35:14 AM This report has been signed electronically. Number of Addenda: 0

## 2021-11-01 NOTE — Interval H&P Note (Signed)
History and Physical Interval Note: 50/female with cirrhosis and esophageal varices, for EGD with banding with propofol.  11/01/2021 9:03 AM  Katherine Garza  has presented today for EGD with banding, with the diagnosis of Esophageal varices, Cirrhosis of liver without ascites.  The various methods of treatment have been discussed with the patient and family. After consideration of risks, benefits and other options for treatment, the patient has consented to  Procedure(s): ESOPHAGOGASTRODUODENOSCOPY (EGD) WITH PROPOFOL (N/A) GASTRIC VARICES BANDING (N/A) as a surgical intervention.  The patient's history has been reviewed, patient examined, no change in status, stable for surgery.  I have reviewed the patient's chart and labs.  Questions were answered to the patient's satisfaction.     Ronnette Juniper

## 2021-11-01 NOTE — Anesthesia Procedure Notes (Signed)
Procedure Name: MAC Date/Time: 11/01/2021 9:20 AM Performed by: Lieutenant Diego, CRNA Pre-anesthesia Checklist: Patient identified, Emergency Drugs available, Suction available, Patient being monitored and Timeout performed Patient Re-evaluated:Patient Re-evaluated prior to induction Oxygen Delivery Method: Simple face mask Preoxygenation: Pre-oxygenation with 100% oxygen Induction Type: IV induction

## 2021-11-02 ENCOUNTER — Encounter: Payer: Self-pay | Admitting: Internal Medicine

## 2021-11-02 LAB — SURGICAL PATHOLOGY

## 2021-11-02 NOTE — Progress Notes (Signed)
Received lab report following lab work completed prior to EGD on 11/22.  Patient is pancytopenic with WBC of 1.5, Platelet count of 35, and hemoglobin of 11.  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 On chart review no documentation indicating she has been seen hematology. May consider referral to hematology at next office visit with leukopenia.

## 2021-11-04 ENCOUNTER — Encounter (HOSPITAL_COMMUNITY): Payer: Self-pay | Admitting: Gastroenterology

## 2021-11-08 LAB — PROTIME-INR

## 2021-11-24 ENCOUNTER — Other Ambulatory Visit (HOSPITAL_COMMUNITY): Payer: Self-pay

## 2021-11-25 ENCOUNTER — Encounter: Payer: Self-pay | Admitting: Internal Medicine

## 2021-11-25 ENCOUNTER — Other Ambulatory Visit: Payer: Self-pay

## 2021-11-25 ENCOUNTER — Ambulatory Visit (INDEPENDENT_AMBULATORY_CARE_PROVIDER_SITE_OTHER): Payer: BC Managed Care – PPO | Admitting: Internal Medicine

## 2021-11-25 ENCOUNTER — Other Ambulatory Visit (HOSPITAL_COMMUNITY): Payer: Self-pay

## 2021-11-25 VITALS — BP 124/66 | HR 65 | Temp 97.5°F | Ht 64.0 in | Wt 215.6 lb

## 2021-11-25 DIAGNOSIS — D61818 Other pancytopenia: Secondary | ICD-10-CM

## 2021-11-25 DIAGNOSIS — I8510 Secondary esophageal varices without bleeding: Secondary | ICD-10-CM

## 2021-11-25 DIAGNOSIS — K259 Gastric ulcer, unspecified as acute or chronic, without hemorrhage or perforation: Secondary | ICD-10-CM | POA: Insufficient documentation

## 2021-11-25 DIAGNOSIS — K7460 Unspecified cirrhosis of liver: Secondary | ICD-10-CM

## 2021-11-25 DIAGNOSIS — I8500 Esophageal varices without bleeding: Secondary | ICD-10-CM | POA: Insufficient documentation

## 2021-11-25 DIAGNOSIS — E1165 Type 2 diabetes mellitus with hyperglycemia: Secondary | ICD-10-CM

## 2021-11-25 LAB — POCT GLYCOSYLATED HEMOGLOBIN (HGB A1C): Hemoglobin A1C: 10.4 % — AB (ref 4.0–5.6)

## 2021-11-25 LAB — GLUCOSE, CAPILLARY: Glucose-Capillary: 150 mg/dL — ABNORMAL HIGH (ref 70–99)

## 2021-11-25 MED ORDER — UNIFINE PENTIPS 32G X 4 MM MISC: 1.0000 | Freq: Every day | 1 refills | Status: DC

## 2021-11-25 MED ORDER — SOLIQUA 100-33 UNT-MCG/ML ~~LOC~~ SOPN: 19.0000 [IU] | PEN_INJECTOR | Freq: Every day | SUBCUTANEOUS | 1 refills | Status: DC

## 2021-11-25 MED ORDER — NADOLOL 40 MG PO TABS
40.0000 mg | ORAL_TABLET | Freq: Every day | ORAL | 1 refills | Status: DC
  Filled 2021-11-25: qty 30, 30d supply, fill #0

## 2021-11-25 MED ORDER — NADOLOL 40 MG PO TABS: 40.0000 mg | ORAL_TABLET | Freq: Every day | ORAL | 1 refills | Status: DC

## 2021-11-25 MED ORDER — SOLIQUA 100-33 UNT-MCG/ML ~~LOC~~ SOPN
19.0000 [IU] | PEN_INJECTOR | Freq: Every day | SUBCUTANEOUS | 1 refills | Status: DC
  Filled 2021-11-25: qty 3, 16d supply, fill #0

## 2021-11-25 MED ORDER — FUROSEMIDE 40 MG PO TABS
40.0000 mg | ORAL_TABLET | Freq: Every day | ORAL | 1 refills | Status: DC
  Filled 2021-11-25: qty 30, 30d supply, fill #0

## 2021-11-25 MED ORDER — FUROSEMIDE 40 MG PO TABS: 40.0000 mg | ORAL_TABLET | Freq: Every day | ORAL | 1 refills | Status: DC

## 2021-11-25 NOTE — Assessment & Plan Note (Addendum)
Patient denies any melena or hematemesis.   Assessment/Plan:  Recent endoscopy with evidence of small non-bleeding varices. Patent is already on a beta-blocker.   - Continue Nadolol

## 2021-11-25 NOTE — Assessment & Plan Note (Addendum)
Ms. Wooton states she takes Soliqua 15 units daily without difficulty. She has noticed her morning CBGs are between 100-160. She does not have her glucometer with her today.   Assessment/Plan: Uncontrolled T2DM on long acting insulin/GLP1 combo. A1c obtained today and increased to 10.4% from 8%. Will titrate Soliqua.   - Increase Soliqua to 19 units daily - Return to clinic in 1 month with glucometer

## 2021-11-25 NOTE — Progress Notes (Signed)
° °  CC: Pancytopenia; Cirrhosis  HPI:  [name] is a 50 y.o. with a PMHx as listed below who presents to the clinic for Pancytopenia; Cirrhosis.   Please see the Encounters tab for problem-based Assessment & Plan regarding status of patient's acute and chronic conditions.  Past Medical History:  Diagnosis Date   Diabetes mellitus without complication ([place])    Hepatitis C virus infection 01/04/2015   History of malaria    Multiple episodes w/ 2 hospitalizations   Hypertension    Wound of left foot 11/27/2019   Wound of left foot 11/27/2019   Review of Systems: Review of Systems  Constitutional:  Negative for chills and fever.  Respiratory:  Negative for shortness of breath.   Gastrointestinal:  Negative for blood in stool, melena, nausea and vomiting.   Physical Exam:  Vitals:   11/25/21 0923  BP: 124/66  Pulse: 65  Temp: (!) 97.5 F (36.4 C)  TempSrc: Oral  SpO2: 100%  Weight: 215 lb 9.6 oz (97.8 kg)  Height: 5\' 4"  (1.626 m)   Physical Exam Constitutional:      General: She is not in acute distress.    Appearance: She is obese.  Eyes:     Extraocular Movements: Extraocular movements intact.     Pupils: Pupils are equal, round, and reactive to light.  Cardiovascular:     Rate and Rhythm: Normal rate and regular rhythm.     Heart sounds: No murmur heard. Pulmonary:     Effort: Pulmonary effort is normal. No respiratory distress.     Breath sounds: Normal breath sounds. No wheezing, rhonchi or rales.  Abdominal:     General: Bowel sounds are normal.     Palpations: Abdomen is soft.  Musculoskeletal:     Right lower leg: No edema.     Left lower leg: No edema.  Skin:    General: Skin is warm and dry.     Findings: No erythema, lesion or rash.  Neurological:     General: No focal deficit present.     Mental Status: She is alert and oriented to person, place, and time. Mental status is at baseline.     Gait: Gait normal.  Psychiatric:        Mood  and Affect: Mood normal.        Behavior: Behavior normal.    Assessment & Plan:   See Encounters Tab for problem based charting.  Patient discussed with Dr. Jimmye Norman

## 2021-11-25 NOTE — Assessment & Plan Note (Addendum)
Discovered by endoscopy on 10/2021.   Assessment/Plan: Currently asymptomatic.   - Continue Protonix daily.

## 2021-11-25 NOTE — Assessment & Plan Note (Addendum)
Katherine Garza denies any hematemesis, hematochezia or melena.   Assessment/Plan: Most recent CBC with WBC of 1.5, hemoglobin of 11.0 and platelets of 35. 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 Offered referral to Hematology.   - Repeat CBC today  - Referral to Hematology placed  ADDENDUM:  Repeat CBC demonstrates improvement in hemoglobin. However stable but severely low WBC and platelets. Differential notable for moderate neutropenia.

## 2021-11-25 NOTE — Patient Instructions (Signed)
It was nice seeing you today! Thank you for choosing [facility] for your Primary Care.    Today we talked about:   Cirrhosis: Continue to take Nadolol and Furosemide daily.  Low Blood Counts: We will recheck your blood work today and place a referral to the Hematologist.  If you notice any blood in your vomiting or stool or black stool, please go to the emergency room right away.  Diabetes:  Your A1c today increased to 10.4%, which means your average blood sugar is around 240. We will need to increase your Soliqua.  Please increase Soliqua to 19 units daily.  Check your blood sugar every morning right when you wake up. If you notice that your sugar is above 160 consistently, please make a follow up appointment in 1 month. Bring your glucometer with you. We may need to continue titrating your medication.  ------------------ [name] leo! [name] kwa [place] yako ya Msingi.  [name] tulizungumza juu ya:  1. Cirrhosis: [place] kuchukua Nadolol na Furosemide kila siku. 2. Hesabu za Chini za Damu: [name] yako ya damu leo na kupeleka rufaa kwa Daktari wa Hematolojia. a. [place] damu yoyote [place] kwako au [employer] au kinyesi cheusi, tafadhali nenda kwenye chumba cha dharura mara moja. 3. [place]ari: a. A1c yako leo imeongezeka hadi 10.4%, ambayo ina maana kwamba sukari yako ya damu wastani ni karibu 240. [place] [name] yako. b. [name] hadi vitengo 19 kila siku. c. [name] yako ya damu kila asubuhi mara tu unapoamka. [name] kuwa sukari yako iko juu ya 160 mfululizo, tafadhali fanya miadi ya kufuatilia baada ya mwezi 1. Lete glucometer yako. Huenda tukahitaji [name] kuorodhesha dawa [name].

## 2021-11-26 LAB — CBC WITH DIFFERENTIAL/PLATELET
Basophils Absolute: 0 10*3/uL (ref 0.0–0.2)
Basos: 1 %
EOS (ABSOLUTE): 0 10*3/uL (ref 0.0–0.4)
Eos: 1 %
Hematocrit: 34 % (ref 34.0–46.6)
Hemoglobin: 11.4 g/dL (ref 11.1–15.9)
Immature Grans (Abs): 0 10*3/uL (ref 0.0–0.1)
Immature Granulocytes: 0 %
Lymphocytes Absolute: 0.7 10*3/uL (ref 0.7–3.1)
Lymphs: 49 %
MCH: 26.1 pg — ABNORMAL LOW (ref 26.6–33.0)
MCHC: 33.5 g/dL (ref 31.5–35.7)
MCV: 78 fL — ABNORMAL LOW (ref 79–97)
Monocytes Absolute: 0.1 10*3/uL (ref 0.1–0.9)
Monocytes: 8 %
Neutrophils Absolute: 0.6 10*3/uL — ABNORMAL LOW (ref 1.4–7.0)
Neutrophils: 41 %
Platelets: 32 10*3/uL — CL (ref 150–450)
RBC: 4.36 x10E6/uL (ref 3.77–5.28)
RDW: 13.6 % (ref 11.7–15.4)
WBC: 1.5 10*3/uL — CL (ref 3.4–10.8)

## 2021-11-28 NOTE — Assessment & Plan Note (Signed)
Katherine Garza denies any acute complaint today. She is taking Lasix daily and feels it is alleviating her swelling. She denies any current abdominal or lower extremity edema. She is following with the transplant center.   Assessment/Plan:  - Continue daily Lasix

## 2021-11-28 NOTE — Progress Notes (Signed)
Internal Medicine Clinic Attending  Case discussed with Dr. Basaraba  At the time of the visit.  We reviewed the resident's history and exam and pertinent patient test results.  I agree with the assessment, diagnosis, and plan of care documented in the resident's note.  

## 2021-11-29 ENCOUNTER — Telehealth: Payer: Self-pay | Admitting: Hematology and Oncology

## 2021-11-29 NOTE — Telephone Encounter (Signed)
Scheduled appt-per 12/16 referral. Pt is aware of appt date and time. Used interpreter services to sch appt.

## 2021-12-13 ENCOUNTER — Encounter: Payer: BC Managed Care – PPO | Admitting: Oncology

## 2021-12-16 NOTE — Progress Notes (Signed)
WBC and platelets remain severely low but stable compared to prior. Hemoglobin WNL. Patient has upcoming hematology appointment.

## 2021-12-18 NOTE — Progress Notes (Signed)
[facility] [phone]   [phone]  INITIAL CONSULT NOTE  Patient Care Team: Christiana Fuchs, DO as PCP - General Comer, Okey Regal, MD as Consulting Physician (Infectious Diseases) Wonda Horner, MD as Consulting Physician (Gastroenterology) Katherine Roan, MD  Hematological/Oncological History # Thrombocytopenia # Leukopenia  07/08/2018: WBC 1.4, Hgb 11.4, Mcv 82, Plt 35 11/25/2021: WBC 1.5, Hgb 11.4, MCV 78, Plt 32 12/19/2021: establish care with Dr. Lorenso Courier   CHIEF COMPLAINTS/PURPOSE OF CONSULTATION:  "Thrombocytopenia in setting of cirrhosis "  HISTORY OF PRESENTING ILLNESS:  Katherine Garza 51 y.o. female with medical history significant for cirrhosis secondary to hepatitis C who presents for evaluation of thrombocytopenia and leukopenia.  On review of the previous records Katherine Garza currently follows with [facility] for her cirrhosis and hepatitis C.  She last underwent an upper GI endoscopy on 11/01/2021.  She underwent an abdominal ultrasound on 09/21/2021 which did show evidence of cirrhotic morphology of the liver but spleen was not evaluated at that time.  On 10/21/2020 the patient was noted to have cirrhotic morphology on CT liver abdomen and also noted to have moderate splenomegaly.  The patient has longstanding leukopenia and thrombocytopenia.  She had similar blood counts on 07/08/2018 with white blood cell count of 1.4 and platelets of 35 with hemoglobin of 11.4.  Most recent labs on 11/25/2021 showed white blood cell count of 1.5, hemoglobin 1.4, and platelets of 32.  Due to concern for these findings the patient was referred to hematology for further evaluation and management.  On exam today Katherine Garza is accompanied by her daughter.  A hospital approved interpreter is used for communication purposes.  The patient notes that she eats a normal diet though she does not eat pork.  She does enjoy eating cow, fish, and chicken.   She notes that she has not recently had any issues with recurrent infections.  She denies any fevers, chills, sweats, nausea, vomiting or diarrhea.  She reports that she is a never smoker and does not drink any alcohol.  She was previously a Psychologist, sport and exercise.  She notes that her family history is unremarkable and does not have any history of blood or cancer disorders.  She reports otherwise no changes in her health over the last several months to years.  A full 10 point ROS is listed below.  MEDICAL HISTORY:  Past Medical History:  Diagnosis Date   Diabetes mellitus without complication ([place])    Hepatitis C virus infection 01/04/2015   History of malaria    Multiple episodes w/ 2 hospitalizations   Hypertension    Wound of left foot 11/27/2019   Wound of left foot 11/27/2019    SURGICAL HISTORY: Past Surgical History:  Procedure Laterality Date   BIOPSY  11/01/2021   Procedure: BIOPSY;  Surgeon: Ronnette Juniper, MD;  Location: [facility];  Service: Gastroenterology;;   COLONOSCOPY WITH PROPOFOL N/A 12/06/2020   Procedure: COLONOSCOPY WITH PROPOFOL;  Surgeon: Ronnette Juniper, MD;  Location: [facility];  Service: Gastroenterology;  Laterality: N/A;   ESOPHAGEAL BANDING  12/06/2020   Procedure: ESOPHAGEAL BANDING;  Surgeon: Ronnette Juniper, MD;  Location: [facility];  Service: Gastroenterology;;   ESOPHAGOGASTRODUODENOSCOPY (EGD) WITH PROPOFOL N/A 12/06/2020   Procedure: ESOPHAGOGASTRODUODENOSCOPY (EGD) WITH PROPOFOL;  Surgeon: Ronnette Juniper, MD;  Location: [facility];  Service: Gastroenterology;  Laterality: N/A;   ESOPHAGOGASTRODUODENOSCOPY (EGD) WITH PROPOFOL N/A 03/10/2021   Procedure: ESOPHAGOGASTRODUODENOSCOPY (EGD) WITH PROPOFOL;  Surgeon: Ronnette Juniper, MD;  Location: [facility];  Service:  Gastroenterology;  Laterality: N/A;   ESOPHAGOGASTRODUODENOSCOPY (EGD) WITH PROPOFOL N/A 11/01/2021   Procedure: ESOPHAGOGASTRODUODENOSCOPY (EGD) WITH PROPOFOL;  Surgeon: Ronnette Juniper, MD;  Location: [facility];   Service: Gastroenterology;  Laterality: N/A;   FOOT SURGERY     GASTRIC VARICES BANDING N/A 03/10/2021   Procedure: GASTRIC VARICES BANDING;  Surgeon: Ronnette Juniper, MD;  Location: [facility];  Service: Gastroenterology;  Laterality: N/A;   POLYPECTOMY  12/06/2020   Procedure: POLYPECTOMY;  Surgeon: Ronnette Juniper, MD;  Location: [facility];  Service: Gastroenterology;;    SOCIAL HISTORY: Social History   Socioeconomic History   Marital status: Married    Spouse name: Not on file   Number of children: Not on file   Years of education: Not on file   Highest education level: Not on file  Occupational History   Not on file  Tobacco Use   Smoking status: Never   Smokeless tobacco: Never  Vaping Use   Vaping Use: Never used  Substance and Sexual Activity   Alcohol use: No    Alcohol/week: 0.0 standard drinks   Drug use: No   Sexual activity: Not on file  Other Topics Concern   Not on file  Social History Narrative   Moved from [place] in 09/2014 w/ husband and son. Previously worked as a Psychologist, sport and exercise w/ cows, Astronomer, and pigs.    Social Determinants of Health   Financial Resource Strain: Not on file  Food Insecurity: Not on file  Transportation Needs: Not on file  Physical Activity: Not on file  Stress: Not on file  Social Connections: Not on file  Intimate Partner Violence: Not on file    FAMILY HISTORY: No family history on file.  ALLERGIES:  has No Known Allergies.  MEDICATIONS:  Current Outpatient Medications  Medication Sig Dispense Refill   furosemide (LASIX) 40 MG tablet Take 1 tablet (40 mg total) by mouth daily. 90 tablet 1   Insulin Glargine-Lixisenatide (SOLIQUA) 100-33 UNT-MCG/ML SOPN Inject 19 Units into the skin daily. 15 mL 1   Insulin Pen Needle (UNIFINE PENTIPS) 32G X 4 MM MISC Use to inject insulin once daily 100 each 1   nadolol (CORGARD) 40 MG tablet Take 1 tablet (40 mg total) by mouth daily. 90 tablet 1   pantoprazole (PROTONIX) 40 MG tablet Take 1  tablet (40 mg total) by mouth daily. 60 tablet 1   No current facility-administered medications for this visit.    REVIEW OF SYSTEMS:   Constitutional: ( - ) fevers, ( - )  chills , ( - ) night sweats Eyes: ( - ) blurriness of vision, ( - ) double vision, ( - ) watery eyes Ears, nose, mouth, throat, and face: ( - ) mucositis, ( - ) sore throat Respiratory: ( - ) cough, ( - ) dyspnea, ( - ) wheezes Cardiovascular: ( - ) palpitation, ( - ) chest discomfort, ( - ) lower extremity swelling Gastrointestinal:  ( - ) nausea, ( - ) heartburn, ( - ) change in bowel habits Skin: ( - ) abnormal skin rashes Lymphatics: ( - ) new lymphadenopathy, ( - ) easy bruising Neurological: ( - ) numbness, ( - ) tingling, ( - ) new weaknesses Behavioral/Psych: ( - ) mood change, ( - ) new changes  All other systems were reviewed with the patient and are negative.  PHYSICAL EXAMINATION:  Vitals:   12/19/21 0951  BP: 129/74  Pulse: 72  Resp: 17  Temp: (!) 97.2 F (36.2 C)  SpO2:  100%   Filed Weights   12/19/21 0951  Weight: 214 lb 9 oz (97.3 kg)    GENERAL: well appearing middle-aged African-American female in NAD  SKIN: skin color, texture, turgor are normal, no rashes or significant lesions EYES: conjunctiva are pink and non-injected, sclera clear LUNGS: clear to auscultation and percussion with normal breathing effort HEART: regular rate & rhythm and no murmurs and no lower extremity edema Musculoskeletal: no cyanosis of digits and no clubbing  PSYCH: alert & oriented x 3, fluent speech NEURO: no focal motor/sensory deficits  LABORATORY DATA:  I have reviewed the data as listed CBC Latest Ref Rng & Units 12/19/2021 11/25/2021 05/31/2020  WBC 4.0 - 10.5 K/uL 1.3(L) 1.5(LL) 1.5(LL)  Hemoglobin 12.0 - 15.0 g/dL 11.0(L) 11.4 11.3  Hematocrit 36.0 - 46.0 % 34.0(L) 34.0 34.7  Platelets 150 - 400 K/uL 33(L) 32(LL) 45(LL)    CMP Latest Ref Rng & Units 12/19/2021 10/21/2020 07/02/2020  Glucose 70 - 99  mg/dL 162(H) - -  BUN 6 - 20 mg/dL 13 - -  Creatinine 0.44 - 1.00 mg/dL 0.65 0.60 0.50  Sodium 135 - 145 mmol/L 142 - -  Potassium 3.5 - 5.1 mmol/L 4.1 - -  Chloride 98 - 111 mmol/L 108 - -  CO2 22 - 32 mmol/L 29 - -  Calcium 8.9 - 10.3 mg/dL 9.8 - -  Total Protein 6.5 - 8.1 g/dL 7.8 - -  Total Bilirubin 0.3 - 1.2 mg/dL 0.5 - -  Alkaline Phos 38 - 126 U/L 47 - -  AST 15 - 41 U/L 16 - -  ALT 0 - 44 U/L 16 - -    RADIOGRAPHIC STUDIES: I have personally reviewed the radiological images as listed and agreed with the findings in the report. No results found.  ASSESSMENT & PLAN Katherine Garza 51 y.o. female with medical history significant for cirrhosis secondary to hepatitis C who presents for evaluation of thrombocytopenia and leukopenia.  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  Thrombocytopenia is a common condition with a broad differential. The possible etiologies of thrombocytopenia include liver disease, splenomegaly, infectious process, nutritional deficiency, consumptoms/autoimmune destruction, pseudothrombocytopenia, and bone marrow disorders. Cirrhosis and liver disease the most common causes of moderate thrombocytopenia.  The patient has a known history of hepatitis C and has been evaluated for hepatitis B and HIV in the past.  She also has imaging on file which shows splenomegaly as well as cirrhotic morphology of the liver.  Nutritional etiologies should be ruled out with Vitamin b12 and folate testing.  A peripheral blood smear can help determine if there is clumping leading to pseudothrombocytopenia. If no clear etiology can be found would need to consider immune thrombocytopenia (ITP) with consideration of bone marrow biopsy.  #Thrombocytopenia in Setting of Cirrhosis  #Leukopenia  --will order CBC, CMP, Vitamin b12  and folate.  --will review peripheral blood film to r/o clumping --patient has known history of Hepatitis C cirrhosis, likely etiology of the hematological findings. Previously tested negative for HIV in 2015 and Hepatitis B in 2016 --follows with Dr. Therisa Doyne at [facility] for her cirrhosis. --RTC as needed unless an alternative etiology is noted in today's workup.   Orders Placed This Encounter  Procedures   CBC with Differential ([place] Only)    Standing Status:   Future    Number of Occurrences:  1    Standing Expiration Date:   12/19/2022   CMP ([place] only)    Standing Status:   Future    Number of Occurrences:   1    Standing Expiration Date:   12/19/2022   Lactate dehydrogenase (LDH)    Standing Status:   Future    Number of Occurrences:   1    Standing Expiration Date:   12/19/2022   Vitamin B12    Standing Status:   Future    Number of Occurrences:   1    Standing Expiration Date:   12/19/2022   Folate, Serum    Standing Status:   Future    Number of Occurrences:   1    Standing Expiration Date:   12/19/2022    All questions were answered. The patient knows to call the clinic with any problems, questions or concerns.  A total of more than 60 minutes were spent on this encounter with face-to-face time and non-face-to-face time, including preparing to see the patient, ordering tests and/or medications, counseling the patient and coordination of care as outlined above.   Ledell Peoples, MD Department of Hematology/Oncology [place] [facility] Phone: [phone] Pager: [phone] Email: [email or web address]  12/19/2021 1:33 PM

## 2021-12-19 ENCOUNTER — Inpatient Hospital Stay: Payer: BC Managed Care – PPO | Attending: Hematology and Oncology

## 2021-12-19 ENCOUNTER — Other Ambulatory Visit: Payer: Self-pay

## 2021-12-19 ENCOUNTER — Inpatient Hospital Stay (HOSPITAL_BASED_OUTPATIENT_CLINIC_OR_DEPARTMENT_OTHER): Payer: BC Managed Care – PPO | Admitting: Hematology and Oncology

## 2021-12-19 VITALS — BP 129/74 | HR 72 | Temp 97.2°F | Resp 17 | Wt 214.6 lb

## 2021-12-19 DIAGNOSIS — E119 Type 2 diabetes mellitus without complications: Secondary | ICD-10-CM | POA: Insufficient documentation

## 2021-12-19 DIAGNOSIS — D72829 Elevated white blood cell count, unspecified: Secondary | ICD-10-CM

## 2021-12-19 DIAGNOSIS — D72819 Decreased white blood cell count, unspecified: Secondary | ICD-10-CM | POA: Diagnosis not present

## 2021-12-19 DIAGNOSIS — R161 Splenomegaly, not elsewhere classified: Secondary | ICD-10-CM | POA: Diagnosis not present

## 2021-12-19 DIAGNOSIS — D696 Thrombocytopenia, unspecified: Secondary | ICD-10-CM | POA: Diagnosis not present

## 2021-12-19 DIAGNOSIS — I10 Essential (primary) hypertension: Secondary | ICD-10-CM | POA: Insufficient documentation

## 2021-12-19 DIAGNOSIS — D6959 Other secondary thrombocytopenia: Secondary | ICD-10-CM | POA: Insufficient documentation

## 2021-12-19 DIAGNOSIS — D703 Neutropenia due to infection: Secondary | ICD-10-CM

## 2021-12-19 DIAGNOSIS — K7460 Unspecified cirrhosis of liver: Secondary | ICD-10-CM | POA: Diagnosis not present

## 2021-12-19 LAB — CBC WITH DIFFERENTIAL (CANCER CENTER ONLY)
Abs Immature Granulocytes: 0.01 10*3/uL (ref 0.00–0.07)
Basophils Absolute: 0 10*3/uL (ref 0.0–0.1)
Basophils Relative: 1 %
Eosinophils Absolute: 0 10*3/uL (ref 0.0–0.5)
Eosinophils Relative: 0 %
HCT: 34 % — ABNORMAL LOW (ref 36.0–46.0)
Hemoglobin: 11 g/dL — ABNORMAL LOW (ref 12.0–15.0)
Immature Granulocytes: 1 %
Lymphocytes Relative: 48 %
Lymphs Abs: 0.6 10*3/uL — ABNORMAL LOW (ref 0.7–4.0)
MCH: 26 pg (ref 26.0–34.0)
MCHC: 32.4 g/dL (ref 30.0–36.0)
MCV: 80.4 fL (ref 80.0–100.0)
Monocytes Absolute: 0.1 10*3/uL (ref 0.1–1.0)
Monocytes Relative: 8 %
Neutro Abs: 0.6 10*3/uL — ABNORMAL LOW (ref 1.7–7.7)
Neutrophils Relative %: 42 %
Platelet Count: 33 10*3/uL — ABNORMAL LOW (ref 150–400)
RBC: 4.23 MIL/uL (ref 3.87–5.11)
RDW: 13.6 % (ref 11.5–15.5)
Smear Review: NORMAL
WBC Count: 1.3 10*3/uL — ABNORMAL LOW (ref 4.0–10.5)
nRBC: 0 % (ref 0.0–0.2)

## 2021-12-19 LAB — CMP (CANCER CENTER ONLY)
ALT: 16 U/L (ref 0–44)
AST: 16 U/L (ref 15–41)
Albumin: 4.3 g/dL (ref 3.5–5.0)
Alkaline Phosphatase: 47 U/L (ref 38–126)
Anion gap: 5 (ref 5–15)
BUN: 13 mg/dL (ref 6–20)
CO2: 29 mmol/L (ref 22–32)
Calcium: 9.8 mg/dL (ref 8.9–10.3)
Chloride: 108 mmol/L (ref 98–111)
Creatinine: 0.65 mg/dL (ref 0.44–1.00)
GFR, Estimated: >60 mL/min (ref >60)
Glucose, Bld: 162 mg/dL — ABNORMAL HIGH (ref 70–99)
Potassium: 4.1 mmol/L (ref 3.5–5.1)
Sodium: 142 mmol/L (ref 135–145)
Total Bilirubin: 0.5 mg/dL (ref 0.3–1.2)
Total Protein: 7.8 g/dL (ref 6.5–8.1)

## 2021-12-19 LAB — VITAMIN B12: Vitamin B-12: 700 pg/mL (ref 180–914)

## 2021-12-19 LAB — FOLATE: Folate: 6.4 ng/mL (ref >5.9)

## 2021-12-19 LAB — LACTATE DEHYDROGENASE: LDH: 166 U/L (ref 98–192)

## 2021-12-19 IMAGING — US US ABDOMEN LIMITED
1 series · 14 of 25 positions shown · non-contrast
Comparison: Most recent ultrasound 04/01/2021, most recent CT
10/21/2020

CLINICAL DATA: Cirrhosis of liver. Portal vein thrombosis.
Secondary esophageal varices without bleeding.

EXAM:
ULTRASOUND ABDOMEN LIMITED RIGHT UPPER QUADRANT

[Series 1: us abdomen limited · 0.15mm/px · 14 of 47 slices shown]
[im 1/47]
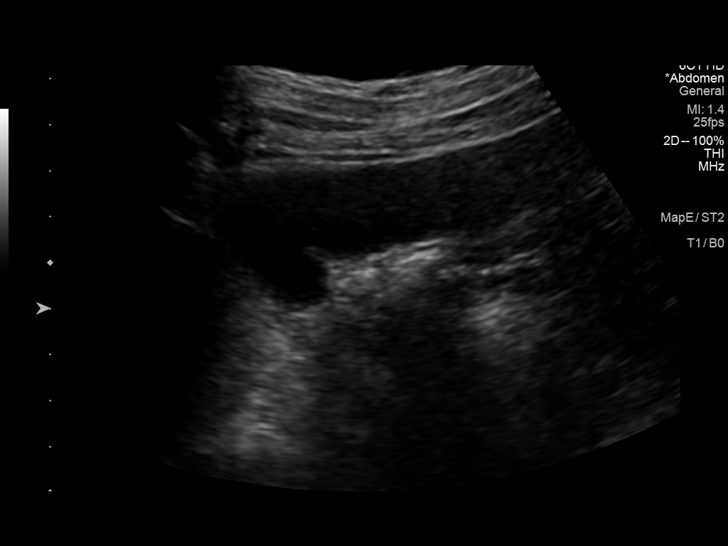
[im 4/47]
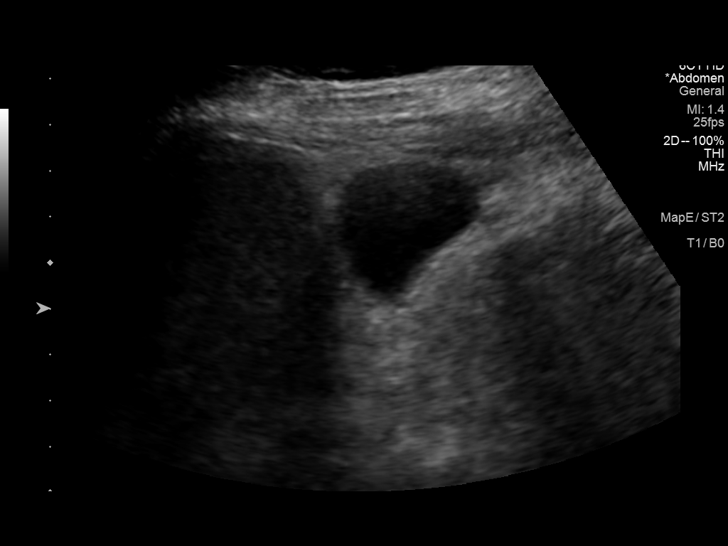
[im 8/47]
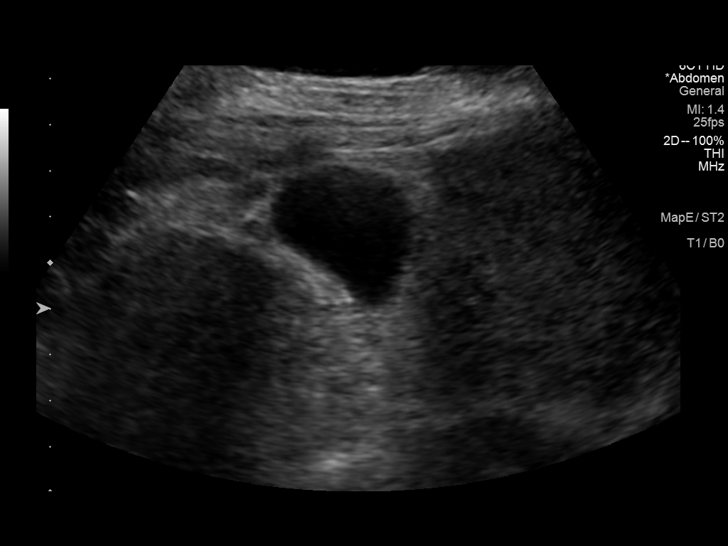
[im 12/47]
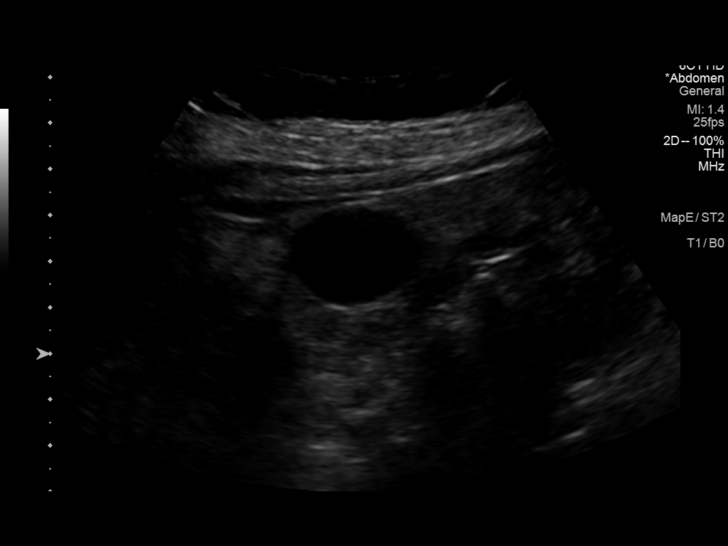
[im 16/47]
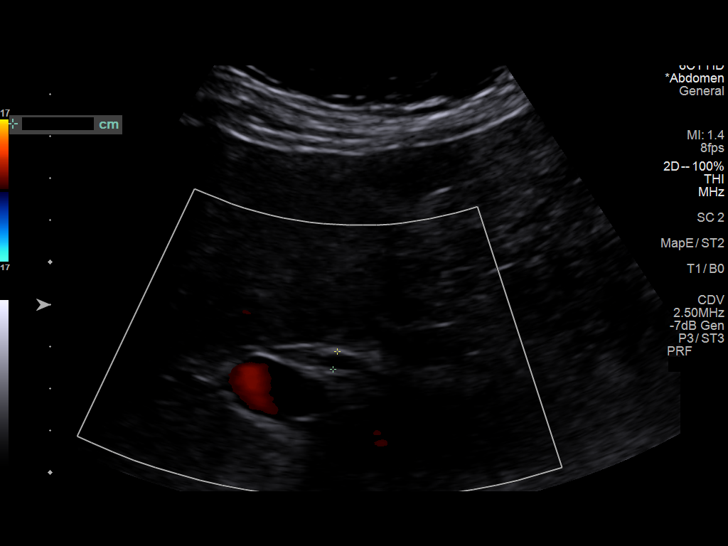
[im 18/47]
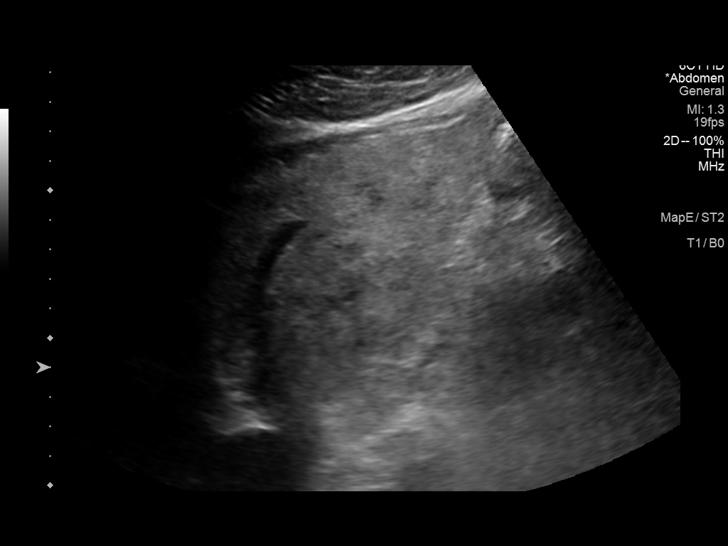
[im 22/47]
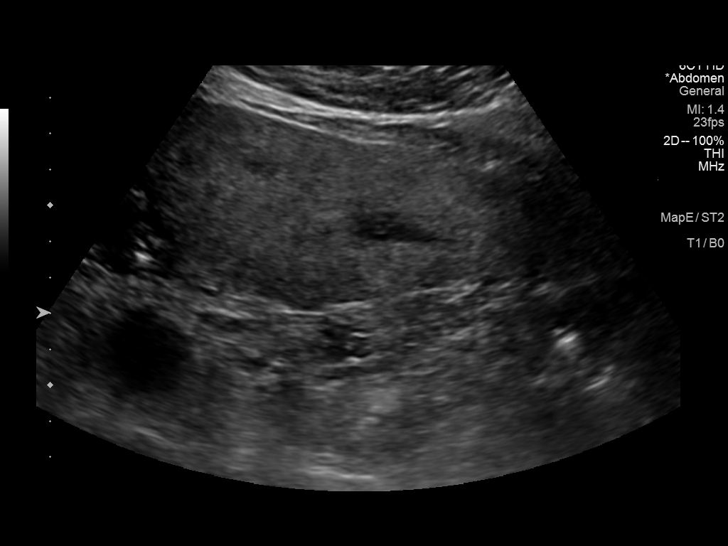
[im 25/47]
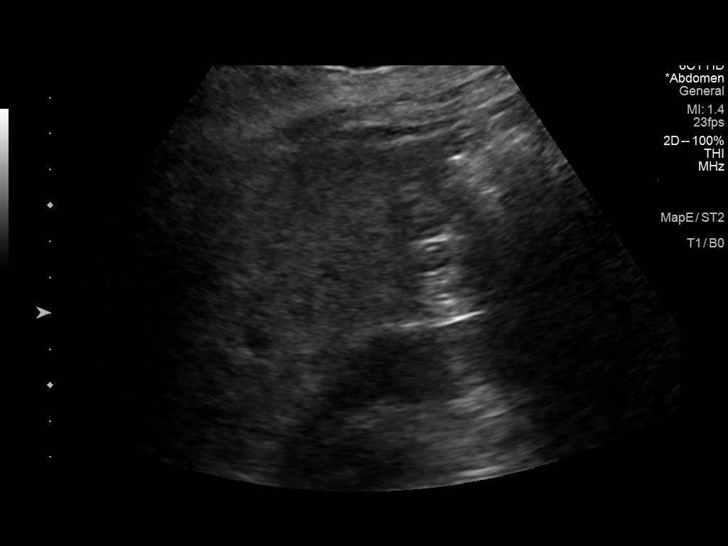
[im 29/47]
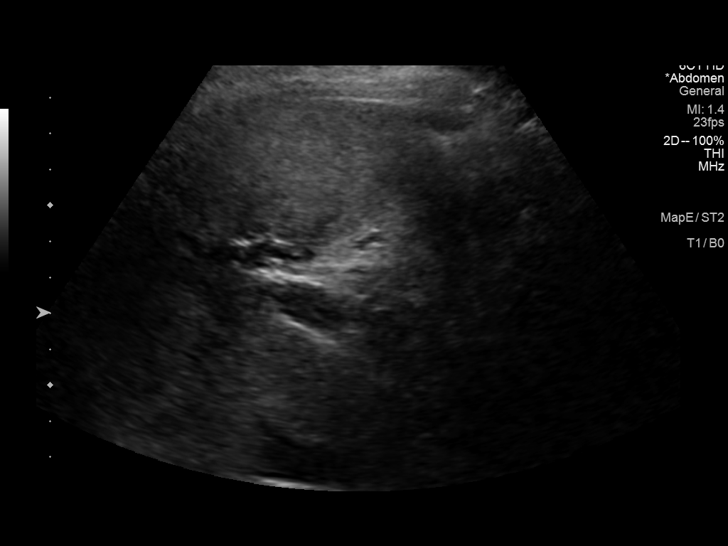
[im 31/47]
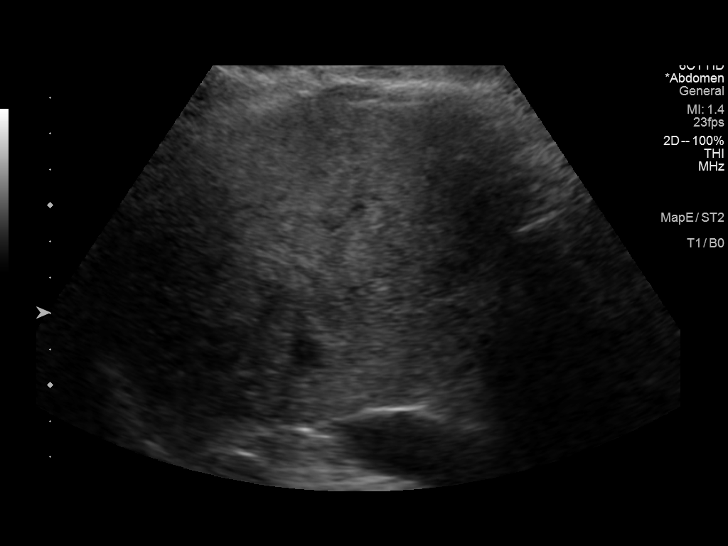
[im 35/47]
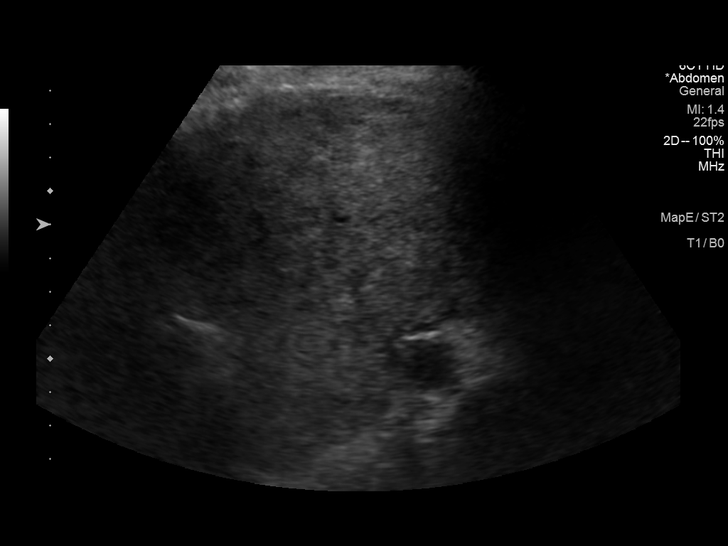
[im 39/47]
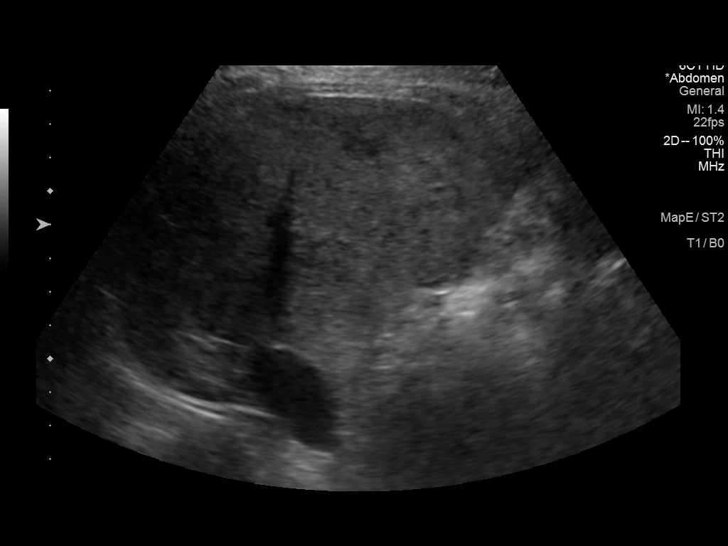
[im 43/47]
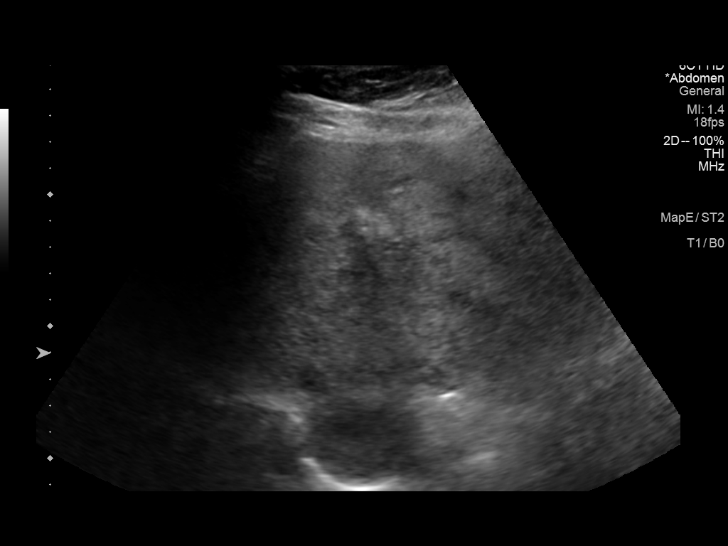
[im 47/47]
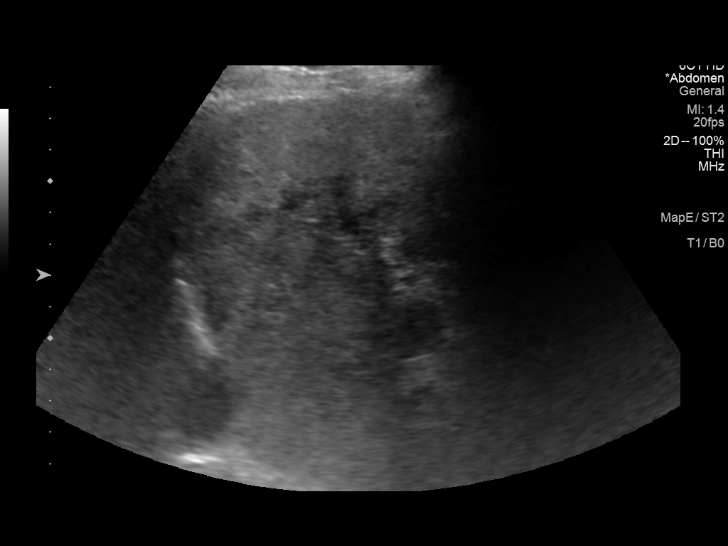

[14 of 25 positions shown; findings below may reference images not displayed]

FINDINGS: Gallbladder:

Physiologically distended. No gallstones or wall thickening
visualized. No sonographic Murphy sign noted by sonographer.

Common bile duct:

Diameter: 4 mm, normal.

Liver:

Heterogeneous, coarsened, and diffusely increased parenchymal
echogenicity. Subtle capsular nodularity. No discrete focal lesion.
The main portal vein is patent on color Doppler imaging with normal
direction of blood flow towards the liver. No visualized
intrahepatic portal vein thrombosis.

Other: No right upper quadrant ascites.
IMPRESSION: 1. Cirrhotic hepatic morphology. No focal lesion demonstrated by
ultrasound.
2. Patent main portal vein.  No visualized portal vein thrombosis.
3. Normal sonographic appearance of gallbladder. No biliary
dilatation.

## 2021-12-21 ENCOUNTER — Telehealth: Payer: Self-pay

## 2021-12-21 NOTE — Telephone Encounter (Signed)
Called patient and spoke with pt's husband using Press photographer. Per Dr. Libby Maw direction, advised that results from lab work do not indicate that  heme treatment is needed and that she should follow up with her liver doctor. Husband will call to make this appointment. He voiced understanding.

## 2022-03-14 ENCOUNTER — Other Ambulatory Visit: Payer: Self-pay | Admitting: Nurse Practitioner

## 2022-03-14 DIAGNOSIS — K7469 Other cirrhosis of liver: Secondary | ICD-10-CM

## 2022-04-12 ENCOUNTER — Other Ambulatory Visit: Payer: Self-pay | Admitting: Internal Medicine

## 2022-04-12 DIAGNOSIS — E1165 Type 2 diabetes mellitus with hyperglycemia: Secondary | ICD-10-CM

## 2022-04-12 NOTE — Telephone Encounter (Signed)
Please have patient follow-up for recheck on HgbA1c. Last seen 12/22 with plans for follow-up in one month. ?

## 2022-05-15 ENCOUNTER — Ambulatory Visit: Payer: BC Managed Care – PPO | Admitting: Student

## 2022-05-15 ENCOUNTER — Encounter: Payer: Self-pay | Admitting: Student

## 2022-05-15 ENCOUNTER — Other Ambulatory Visit (HOSPITAL_COMMUNITY): Payer: Self-pay

## 2022-05-15 VITALS — BP 112/72 | HR 68 | Temp 97.5°F | Ht 64.0 in | Wt 218.6 lb

## 2022-05-15 DIAGNOSIS — Z0000 Encounter for general adult medical examination without abnormal findings: Secondary | ICD-10-CM

## 2022-05-15 DIAGNOSIS — R600 Localized edema: Secondary | ICD-10-CM

## 2022-05-15 DIAGNOSIS — I8510 Secondary esophageal varices without bleeding: Secondary | ICD-10-CM | POA: Diagnosis not present

## 2022-05-15 DIAGNOSIS — E1165 Type 2 diabetes mellitus with hyperglycemia: Secondary | ICD-10-CM | POA: Diagnosis not present

## 2022-05-15 DIAGNOSIS — K7460 Unspecified cirrhosis of liver: Secondary | ICD-10-CM | POA: Diagnosis not present

## 2022-05-15 DIAGNOSIS — D61818 Other pancytopenia: Secondary | ICD-10-CM | POA: Diagnosis not present

## 2022-05-15 DIAGNOSIS — Z794 Long term (current) use of insulin: Secondary | ICD-10-CM

## 2022-05-15 DIAGNOSIS — Z1231 Encounter for screening mammogram for malignant neoplasm of breast: Secondary | ICD-10-CM

## 2022-05-15 LAB — GLUCOSE, CAPILLARY: Glucose-Capillary: 163 mg/dL — ABNORMAL HIGH (ref 70–99)

## 2022-05-15 LAB — POCT GLYCOSYLATED HEMOGLOBIN (HGB A1C): Hemoglobin A1C: 10.1 % — AB (ref 4.0–5.6)

## 2022-05-15 MED ORDER — FUROSEMIDE 40 MG PO TABS: 40.0000 mg | ORAL_TABLET | Freq: Every day | ORAL | 3 refills | Status: DC

## 2022-05-15 MED ORDER — NADOLOL 40 MG PO TABS: 40.0000 mg | ORAL_TABLET | Freq: Every day | ORAL | 3 refills | Status: DC

## 2022-05-15 MED ORDER — NADOLOL 40 MG PO TABS
40.0000 mg | ORAL_TABLET | Freq: Every day | ORAL | 3 refills | Status: DC
  Filled 2022-05-15: qty 30, 30d supply, fill #0

## 2022-05-15 MED ORDER — FUROSEMIDE 40 MG PO TABS
40.0000 mg | ORAL_TABLET | Freq: Every day | ORAL | 3 refills | Status: DC
  Filled 2022-05-15: qty 30, 30d supply, fill #0

## 2022-05-15 NOTE — Progress Notes (Signed)
   CC: Follow-up  HPI:  [name] is a 51 y.o. female with PMH as below who presents to clinic to follow-up on her chronic medical problems. Please see problem based charting for evaluation, assessment and plan.  Encounter completed via a video Swahili interpreter  Past Medical History:  Diagnosis Date   Diabetes mellitus without complication ([place])    Hepatitis C virus infection 01/04/2015   History of malaria    Multiple episodes w/ 2 hospitalizations   Hypertension    Wound of left foot 11/27/2019   Wound of left foot 11/27/2019    Review of Systems:  Constitutional: Negative for fever or fatigue Eyes: Negative for visual changes Respiratory: Negative for shortness of breath Cardiac: Negative for chest pain Abdomen: Negative for abdominal pain, abdominal swelling, constipation or diarrhea Neuro: Negative for confusion, dizziness, headache or weakness  Physical Exam: General: Pleasant, well-appearing middle-aged woman.  No acute distress. HEENT: Anicteric sclerae. Cardiac: RRR. No murmurs, rubs or gallops. 1+ BLE edema Respiratory: Lungs CTAB. No wheezing or crackles. Abdominal: Obese.  Soft.  NT/ND. No abdominal wall edema. No shift fluid wave.  Normal BS. Skin: Warm, dry. No jaundice, petechia, telangiectasia or spider angioma.  Extremities: Atraumatic. Full ROM. Palpable radial and DP pulses. Neuro: A&O x 3. Moves all extremities. Normal sensation to gross touch. Psych: Appropriate mood and affect.  Vitals:   05/15/22 0903 05/15/22 0951  BP: (!) 141/79 112/72  Pulse: 73 68  Temp: (!) 97.5 F (36.4 C)   TempSrc: Oral   SpO2: 100%   Weight: 218 lb 9.6 oz (99.2 kg)   Height: '5\' 4"'$  (1.626 m)      Assessment & Plan:   See Encounters Tab for problem based charting.  Patient discussed with Dr. Emelia Salisbury, MD, MPH

## 2022-05-15 NOTE — Assessment & Plan Note (Signed)
Remained stable. Patient with 1+ BLE pitting edema that has not significantly improve or worsens according to patient.  Patient denies any leg pain or dyspnea on exertion. -Continue Lasix 40 mg daily

## 2022-05-15 NOTE — Assessment & Plan Note (Addendum)
Patient follows with [facility] and recently established with [facility] transplant hepatology. Currently undergoing hepatoma screening. Recent liver synthetic function within normal limits. MELD Na score of 7 (based on 4/323 labs) shows only only a 1.9% estimated 3 month mortality. Child Pugh score A. History of pancytopenia 2/2 to her cirrhosis.  Plan:  -Continue furosemide 40 mg daily -Continue nadolol 40 mg daily -Follow up with GI and transplant hepatology as scheduled -Follow up CBC

## 2022-05-15 NOTE — Assessment & Plan Note (Signed)
A1c today 10.1%, not significantly improved from 10.4% 5 months ago. Patient currently taking Soliqua 18 units daily.  States her blood sugar runs in the 80s to 160s however patient did not bring her meter with her. Her diet includes mostly African dishes such as fufu, yams, chicken, fish and plantain. She denies any polyuria or polydipsia.  Patient counseled that she should limit her intake of sugary or high carb foods.  Patient informed that fufu and yams are considered high starch foods and she should cut down on these types of foods.  Patient also advised to continue checking her blood sugar in the morning in the evening at home and follow-up in clinic next week for adjustment to her insulin.  Plan: -Continue Soliqua 18 units daily -1 week follow-up for insulin adjustment -Can consider placing a CGM at next office visit. -Follow-up urine microalbumin  Addendum: Microalbumin creatinine ratio within normal limits.

## 2022-05-15 NOTE — Assessment & Plan Note (Signed)
Patient evaluated by hematology earlier this year.  Pancytopenia thought to be due to her cirrhosis.  Recommended close monitoring. -F/u repeat CBC  Addendum: Repeat CBC show WBC of 1.4, hemoglobin of 11.7 and an inability to count platelet due to clumping. This is not significantly different from previous CBCs.

## 2022-05-15 NOTE — Patient Instructions (Addendum)
Asante, Bi.Soniyah Catalina kwa kuturuhusu [employer]. Leo tumejadili ugonjwa wako wa cirrhosis, na kisukari. Nimeagiza baadhi ya maabara kwa ajili ya ugonjwa wako wa cirrhosis na tutazijadili katika ziara inayofuata ya ofisi. A1c yako haijadhibitiwa vizuri kwa hivyo nataka upunguze kiwango cha vyakula vya sukari na wanga kwenye lishe yako. Ninataka uje na mita yako wiki ijayo ili tufanye marekebisho kwenye insulini yako.  Nimekuagiza Lavell Luster zifuatazo:   Maagizo ya Maabara      Glucose, capillary      CBC yenye Diff      Uwiano wa Mkojo wa Microalbumin / Creatinine      POC Hbg A1C  Nitapiga simu ikiwa yoyote sio [place]. Maabara zako zote zinaweza [place] "Chati Yangu".   Nimeagiza vipimo vifuatavyo: Mammogram  Ufikiaji Wangu wa Chati: [email or web address]  Tafadhali fuatilia baada ya wiki 1  [facility] dakika 15 kabla ya miadi yako ijayo. Narda Amber, unaweza [place].  [place]ia Clearnce Sorrel Dorrene German. Tafadhali pigia kliniki yetu kwa [phone] Pearletha Forge maswali au wasiwasi wowote. Wakati mzuri wa [place] simu ni Jumatatu-Ijumaa [place] 9am-4pm, lakini [place] mtu [employer]. Ikiwa baada ya saa au wikendi, piga simu kwa nambari kuu ya hospitali na umwulize [employer] wa Dawa ya Ndani kwenye Simu. Ikiwa unahitaji kujazwa tena na dawa, Virgel Gess duka lako la dawa wiki moja kabla na watatutumia ombi.  Asante kwa kuturuhusu kushiriki [place]. Nakutakia kila la [place]!  Lacinda Axon, MD 05/15/2022, 10:06 AM Jeannine Kitten, PGY-2 [place] 41:10  Thank you, Ms.Kirin Stalzer for allowing [place] to provide your care today. Today we discussed your cirrhosis, and diabetes. I have ordered some labs for your cirrhosis and we will discus them at the next office visit. Your A1c is not very well controlled so I want you to decrease the amount of sugary foods and carbs in  your diet. I want you to bring your meter with you next week so we can make adjustments to your insulin.     I have ordered the following labs for you:   Lab Orders         Glucose, capillary         CBC with Diff         Microalbumin / Creatinine Urine Ratio         POC Hbg A1C      I will call if any are abnormal. All of your labs can be accessed through "My Chart".   I have ordered the following tests: Mammogram  My Chart Access: [email or web address]  Please follow-up in 1 week  Please make sure to arrive 15 minutes prior to your next appointment. If you arrive late, you may be asked to reschedule.    We look forward to seeing you next time. Please call our clinic at [phone] if you have any questions or concerns. The best time to call is Monday-Friday from 9am-4pm, but there is someone available 24/7. If after hours or the weekend, call the main hospital number and ask for the Internal Medicine Resident On-Call. If you need medication refills, please notify your pharmacy one week in advance and they will send [place] a request.   Thank you for letting us take part in your care. Wishing you the best!  Lacinda Axon, MD 05/15/2022, 10:06 AM IM Resident, PGY-2 Oswaldo Milian 41:10

## 2022-05-15 NOTE — Assessment & Plan Note (Signed)
Refered for screening mammogram today.

## 2022-05-15 NOTE — Assessment & Plan Note (Signed)
Stable. Patient denies any melena, hemoptysis or hematochezia. The most recent EGD in November 2022 showed grade 1 esophageal varices. She previously underwent band ligation for grade 3 varices -Continue nadolol 40 mg daily.

## 2022-05-16 LAB — CBC WITH DIFFERENTIAL/PLATELET
Basophils Absolute: 0 10*3/uL (ref 0.0–0.2)
Basos: 0 %
EOS (ABSOLUTE): 0 10*3/uL (ref 0.0–0.4)
Eos: 1 %
Hematocrit: 35.8 % (ref 34.0–46.6)
Hemoglobin: 11.7 g/dL (ref 11.1–15.9)
Immature Grans (Abs): 0 10*3/uL (ref 0.0–0.1)
Immature Granulocytes: 0 %
Lymphocytes Absolute: 0.7 10*3/uL (ref 0.7–3.1)
Lymphs: 51 %
MCH: 26.8 pg (ref 26.6–33.0)
MCHC: 32.7 g/dL (ref 31.5–35.7)
MCV: 82 fL (ref 79–97)
Monocytes Absolute: 0.2 10*3/uL (ref 0.1–0.9)
Monocytes: 10 %
Neutrophils Absolute: 0.6 10*3/uL — ABNORMAL LOW (ref 1.4–7.0)
Neutrophils: 38 %
RBC: 4.37 x10E6/uL (ref 3.77–5.28)
RDW: 13.8 % (ref 11.7–15.4)
WBC: 1.4 10*3/uL — CL (ref 3.4–10.8)

## 2022-05-16 LAB — MICROALBUMIN / CREATININE URINE RATIO
Creatinine, Urine: 98.6 mg/dL
Microalb/Creat Ratio: 12 mg/g creat (ref 0–29)
Microalbumin, Urine: 11.5 ug/mL

## 2022-05-18 NOTE — Addendum Note (Signed)
Addended by: Aldine Contes on: 05/18/2022 11:12 AM   Modules accepted: Level of Service

## 2022-05-18 NOTE — Progress Notes (Signed)
Internal Medicine Clinic Attending  Case discussed with Dr. Amponsah  At the time of the visit.  We reviewed the resident's history and exam and pertinent patient test results.  I agree with the assessment, diagnosis, and plan of care documented in the resident's note.  

## 2022-05-22 ENCOUNTER — Other Ambulatory Visit (HOSPITAL_COMMUNITY): Payer: Self-pay

## 2022-05-22 ENCOUNTER — Encounter: Payer: Self-pay | Admitting: Student

## 2022-05-22 ENCOUNTER — Ambulatory Visit (INDEPENDENT_AMBULATORY_CARE_PROVIDER_SITE_OTHER): Payer: BC Managed Care – PPO | Admitting: Student

## 2022-05-22 DIAGNOSIS — Z0000 Encounter for general adult medical examination without abnormal findings: Secondary | ICD-10-CM

## 2022-05-22 DIAGNOSIS — Z794 Long term (current) use of insulin: Secondary | ICD-10-CM

## 2022-05-22 DIAGNOSIS — E1165 Type 2 diabetes mellitus with hyperglycemia: Secondary | ICD-10-CM

## 2022-05-22 MED ORDER — SOLIQUA 100-33 UNT-MCG/ML ~~LOC~~ SOPN
21.0000 [IU] | PEN_INJECTOR | Freq: Every day | SUBCUTANEOUS | 2 refills | Status: DC
  Filled 2022-05-22: qty 6, 28d supply, fill #0

## 2022-05-22 NOTE — Patient Instructions (Addendum)
Thank you, Ms.Debroah Rada for allowing [place] to provide your care today. Today we discussed your diabetes.  Thank you for bringing in your meter.  Your blood sugars overall are in a good range but you do have some elevated sugars throughout the day. I am increasing your insulin and recommend that you continue to work on decreasing the amount of starch, sugar and carbs in your diet. Continue checking your blood sugars daily.  I have ordered the following medication/changed the following medications:  Increase Soliqua from 19 units to 21 units daily injection  My Chart Access: [email or web address]  Please follow-up in 2 weeks with a female provider for Pap smear  Please make sure to arrive 15 minutes prior to your next appointment. If you arrive late, you may be asked to reschedule.    We look forward to seeing you next time. Please call our clinic at [phone] if you have any questions or concerns. The best time to call is Monday-Friday from 9am-4pm, but there is someone available 24/7. If after hours or the weekend, call the main hospital number and ask for the Internal Medicine Resident On-Call. If you need medication refills, please notify your pharmacy one week in advance and they will send [place] a request.   Thank you for letting us take part in your care. Wishing you the best!  Lacinda Axon, MD 05/22/2022, 10:22 AM IM Resident, PGY-2 [place] 41:10   Asante, Bi.Shalae Isensee kwa kuturuhusu [employer]. Leo tumejadili ugonjwa wako wa kisukari. Asante kwa kuleta mita yako. Viwango vya sukari kwenye damu yako kwa ujumla viko katika kiwango kizuri lakini una sukari nyingi kwa siku nzima. Ninaongeza insulini yako na ninapendekeza uendelee kufanya kazi katika kupunguza kiwango cha [place], sukari na wanga katika lishe yako. Endelea kuangalia sukari yako ya damu kila siku.  Nimeagiza dawa zifuatazo/ nimebadilisha dawa  zifuatazo: 1. Jeanette Caprice kutoka uniti 19 hadi 21 za sindano kila siku  Ufikiaji Wangu wa Chati: [email or web address]  Tafadhali fuatilia baada ya wiki 2 na mtoa huduma wa [place] kwa Pap smear  Tally Due Dundee dakika 15 kabla ya miadi yako ijayo. Narda Amber, unaweza [place].  T[place]atazamia Clearnce Sorrel Dorrene German. Tafadhali pigia kliniki yetu kwa [phone] Pearletha Forge maswali au wasiwasi wowote. Wakati mzuri wa [place] simu ni Jumatatu-Ijumaa [place] 9am-4pm, lakini [place] mtu [employer]. Ikiwa baada ya saa au wikendi, piga simu kwa nambari kuu ya hospitali na umwulize [employer] wa Dawa ya Ndani kwenye Simu. Ikiwa unahitaji kujazwa tena na dawa, Virgel Gess duka lako la dawa wiki moja kabla na watatutumia ombi.  Asante kwa kuturuhusu kushiriki [place]. Nakut[place] kila la [place]!  Lacinda Axon, MD 05/22/2022, 10:22 AM Jeannine Kitten, PGY-2 Is[place] 41:10

## 2022-05-22 NOTE — Progress Notes (Signed)
   CC: Diabetes follow up  HPI:  Ms.Mckinsey Cullinan is a 51 y.o. female with PMH as below who presents to clinic for follow-up on her diabetes. Please see problem based charting for evaluation, assessment and plan.  Past Medical History:  Diagnosis Date   Diabetes mellitus without complication ([place])    Hepatic cirrhosis ([place]) 11/27/2014   Compensated, follows with GI and hepatology   Hepatitis C virus infection 01/04/2015   History of malaria    Multiple episodes w/ 2 hospitalizations   Hypertension    Pancytopenia ([place])    Secondary to cirrhosis   Splenomegaly 11/27/2014   Secondary to cirrhosis   Wound of left foot 11/27/2019   Wound of left foot 11/27/2019    Review of Systems:  Constitutional: Negative for fatigue or polydipsia Eyes: Negative for visual changes Respiratory: Negative for shortness of breath Abdomen: Negative for abdominal pain, constipation or diarrhea Neuro: Negative for headache, dizziness, confusion or weakness  Physical Exam: General: Pleasant, middle-age woman accompanied by daughter.  No acute distress. Eyes: Anicteric sclera Cardiac: RRR. No murmurs, rubs or gallops. Trace BLE edema. Respiratory: Lungs CTAB. No wheezing or crackles. Abdominal: Soft, symmetric and non tender. Normal BS. Skin: Warm, dry.  Lower extremity with skin tightness and mild discoloration Extremities: Atraumatic. Full ROM. Radial and DP pulses 2+ and symmetric Neuro: A&O x 3. Moves all extremities.  Normal sensation to gross touch. Psych: Appropriate mood and affect.  Vitals:   05/22/22 0941  BP: 131/79  Pulse: 75  Temp: 97.7 F (36.5 C)  TempSrc: Oral  SpO2: 98%  Weight: 218 lb (98.9 kg)  Height: '5\' 4"'$  (1.626 m)     Assessment & Plan:   See Encounters Tab for problem based charting.  Patient discussed with Dr.  Louann Liv, MD, MPH

## 2022-05-23 ENCOUNTER — Encounter: Payer: Self-pay | Admitting: Student

## 2022-05-23 NOTE — Assessment & Plan Note (Signed)
Patient is scheduled for Pap smear on July 3rd with Dr. Howie Ill.

## 2022-05-23 NOTE — Assessment & Plan Note (Addendum)
Patient presents today for 1 week diabetes follow-up after A1c of 10.1% last week had not significantly changed from 10.4% 5 months ago. She brought her meter to this appointment. Printout of her meter over the last month shows an average blood sugar of 159, lowest blood sugar of 113 and highest of 335. Patient in the target range 81.5% and above target range 18.5%. There was no hypoglycemic episodes. She denies any polyuria or polydipsia.  Patient and daughter reported not knowing what to eat. She was counseled again about decreasing intake of sugary foods and foods high in starch.   Plan: -Increase Soliqua from 19 units to 21 units daily -Lifestyle modifications with dietary changes and exercise -Referral to ophthalmology for diabetic eye exam -Repeat A1c in 3 months

## 2022-05-24 NOTE — Progress Notes (Signed)
Internal Medicine Clinic Attending  Case discussed with Dr. Amponsah  At the time of the visit.  We reviewed the resident's history and exam and pertinent patient test results.  I agree with the assessment, diagnosis, and plan of care documented in the resident's note.  

## 2022-06-12 ENCOUNTER — Ambulatory Visit: Payer: BC Managed Care – PPO | Admitting: Internal Medicine

## 2022-06-12 ENCOUNTER — Encounter: Payer: BC Managed Care – PPO | Admitting: Internal Medicine

## 2022-06-12 ENCOUNTER — Other Ambulatory Visit (HOSPITAL_COMMUNITY)
Admission: RE | Admit: 2022-06-12 | Discharge: 2022-06-12 | Disposition: A | Discharge status: Home / Self Care | Payer: BC Managed Care – PPO | Source: Ambulatory Visit | Attending: Internal Medicine | Admitting: Internal Medicine

## 2022-06-12 VITALS — BP 124/68 | HR 73 | Temp 97.8°F | Wt 219.0 lb

## 2022-06-12 DIAGNOSIS — Z794 Long term (current) use of insulin: Secondary | ICD-10-CM

## 2022-06-12 DIAGNOSIS — E1165 Type 2 diabetes mellitus with hyperglycemia: Secondary | ICD-10-CM | POA: Diagnosis not present

## 2022-06-12 DIAGNOSIS — Z124 Encounter for screening for malignant neoplasm of cervix: Secondary | ICD-10-CM

## 2022-06-12 MED ORDER — UNIFINE PENTIPS 32G X 4 MM MISC: 1.0000 | Freq: Every day | 2 refills | Status: DC

## 2022-06-12 MED ORDER — SOLIQUA 100-33 UNT-MCG/ML ~~LOC~~ SOPN: 21.0000 [IU] | PEN_INJECTOR | Freq: Every day | SUBCUTANEOUS | 2 refills | Status: DC

## 2022-06-12 NOTE — Assessment & Plan Note (Addendum)
Patient presents for cervical cancer screening.  She denies discharge or pain with intercourse.  She is sexually active with her husband.  She underwent menopause about 6 to 7 years ago.  Her last Pap smear was done in 2019 and was negative for intraepithelial lesions or malignancy at that time. P: -Pap smear with HPV -If negative for lesions and HPV she would not need a repeat Pap for another 5 years.  Female nursing staff present during patient's pelvic examination   Addendum 7/10: Pap smear negative for lesions and HPV. Repeat due in 2028. Patient called.

## 2022-06-12 NOTE — Assessment & Plan Note (Signed)
Patient presents with a history of uncontrolled type 2 diabetes.  She was diagnosed in 2021 and was started on combination therapy with glargine-lixitenide. Hgb A1c has remained uncontrolled. Dose of insulin was titrated from 19 to 21 units at office visit 2 weeks ago. She did not bring in her glucometer today and is unsure of the averages when she has been checking. Lab Results  Component Value Date   HGBA1C 10.1 (A) 05/15/2022  I talked with patient about following up in 2 months to repeat blood work and possibly needing to add on a pill medication called metformin at that time if blood sugar remains high. A/P: F/u in September to repeat HgbA1c consider adding on metformin at that time Refilled Glargine-lixitenide to [place]

## 2022-06-12 NOTE — Progress Notes (Addendum)
Subjective:  CC: cervical cancer screening  HPI:  [name] is a 51 y.o. female with a past medical history stated below and presents today for cervical cancer screening. Please see problem based assessment and plan for additional details.  Past Medical History:  Diagnosis Date   Diabetes mellitus without complication ([place])    Hepatic cirrhosis ([place]) 11/27/2014   Compensated, follows with GI and hepatology   Hepatitis C virus infection 01/04/2015   History of malaria    Multiple episodes w/ 2 hospitalizations   Hypertension    Pancytopenia ([place])    Secondary to cirrhosis   Splenomegaly 11/27/2014   Secondary to cirrhosis   Wound of left foot 11/27/2019    Current Outpatient Medications on File Prior to Visit  Medication Sig Dispense Refill   furosemide (LASIX) 40 MG tablet Take 1 tablet (40 mg total) by mouth daily. 90 tablet 3   nadolol (CORGARD) 40 MG tablet Take 1 tablet (40 mg total) by mouth daily. 90 tablet 3   pantoprazole (PROTONIX) 40 MG tablet Take 1 tablet (40 mg total) by mouth daily. 60 tablet 1   No current facility-administered medications on file prior to visit.    No family history on file.  Social History   Socioeconomic History   Marital status: Married    Spouse name: Not on file   Number of children: Not on file   Years of education: Not on file   Highest education level: Not on file  Occupational History   Not on file  Tobacco Use   Smoking status: Never   Smokeless tobacco: Never  Vaping Use   Vaping Use: Never used  Substance and Sexual Activity   Alcohol use: No    Alcohol/week: 0.0 standard drinks of alcohol   Drug use: No   Sexual activity: Not on file  Other Topics Concern   Not on file  Social History Narrative   Moved from [place] in 09/[place] w/ husband and son. Previously worked as a Psychologist, sport and exercise w/ cows, Astronomer, and pigs.    Social Determinants of Health   Financial Resource Strain: Not on file  Food Insecurity:  Not on file  Transportation Needs: Not on file  Physical Activity: Not on file  Stress: Not on file  Social Connections: Not on file  Intimate Partner Violence: Not on file    Review of Systems: ROS negative except for what is noted on the assessment and plan.  Objective:   Vitals:   06/12/22 0943  BP: 124/68  Pulse: 73  Temp: 97.8 F (36.6 C)  TempSrc: Oral  SpO2: 99%  Weight: 219 lb (99.3 kg)    Physical Exam: Constitutional: well-appearing , sitting in chair with daughter at side helping with interpretation Cardiovascular: regular rate and rhythm, no m/r/g Pulmonary/Chest: normal work of breathing on room air, lungs clear to auscultation bilaterally Psych: normal mood and affect   GU: normal appearing external gentilia, normal appearing vaginal mucosa and cervix.  Moderate amount of clear discharge noted in vaginal vault.  Pap smear obtained.  Female nursing staff present during patient's pelvic examination   Assessment & Plan:  Cervical cancer screening Patient presents for cervical cancer screening.  She denies discharge or pain with intercourse.  She is sexually active with her husband.  She underwent menopause about 6 to 7 years ago.  Her last Pap smear was done in 2019 and was negative for intraepithelial lesions or malignancy at that time. P: -Pap smear with HPV -  If negative for lesions and HPV she would not need a repeat Pap for another 5 years.  Female nursing staff present during patient's pelvic examination   Addendum 7/10: Pap smear negative for lesions and HPV. Repeat due in 2028. Patient called.  Type 2 diabetes mellitus ([place]) Patient presents with a history of uncontrolled type 2 diabetes.  She was diagnosed in 2021 and was started on combination therapy with glargine-lixitenide. Hgb A1c has remained uncontrolled. Dose of insulin was titrated from 19 to 21 units at office visit 2 weeks ago. She did not bring in her glucometer today and is unsure of the  averages when she has been checking. Lab Results  Component Value Date   HGBA1C 10.1 (A) 05/15/2022  I talked with patient about following up in 2 months to repeat blood work and possibly needing to add on a pill medication called metformin at that time if blood sugar remains high. A/P: F/u in September to repeat HgbA1c consider adding on metformin at that time Refilled Glargine-lixitenide to [place]   Patient discussed with Dr. Hansel Starling [place], D.O. [facility]  PGY-2 Pager: [phone]  Phone: [phone] Date 06/19/2022  Time 11:13 AM

## 2022-06-12 NOTE — Patient Instructions (Addendum)
Thank you, [name] for allowing [place] to provide your care today. Today we discussed cervical cancer screening.    I have ordered the following labs for you:  Lab Orders  No laboratory test(s) ordered today     Tests ordered today:  Cervical cancer screening  Referrals ordered today:   Referral Orders  No referral(s) requested today     I have ordered the following medication/changed the following medications:   Stop the following medications: There are no discontinued medications.   Start the following medications: No orders of the defined types were placed in this encounter.   Remember: Please complete mammogram as well as follow-up with eye doctor. Follow up: 2 months to recheck blood work with diabetes.  We look forward to seeing you next time. Please call our clinic at [phone] if you have any questions or concerns. The best time to call is Monday-Friday from 9am-4pm, but there is someone available 24/7. If after hours or the weekend, call the main hospital number and ask for the Internal Medicine Resident On-Call. If you need medication refills, please notify your pharmacy one week in advance and they will send [place] a request.   Thank you for trusting me with your care. Wishing you the best!   Christiana Fuchs, [place]

## 2022-06-14 ENCOUNTER — Encounter: Payer: BC Managed Care – PPO | Admitting: Internal Medicine

## 2022-06-14 LAB — CYTOLOGY - PAP
Adequacy: ABSENT
Comment: NEGATIVE
Diagnosis: NEGATIVE
High risk HPV: NEGATIVE

## 2022-06-14 NOTE — Progress Notes (Signed)
Internal Medicine Clinic Attending  I saw and evaluated the patient.  I personally confirmed the key portions of the history and exam documented by Dr. Masters and I reviewed pertinent patient test results.  The assessment, diagnosis, and plan were formulated together and I agree with the documentation in the resident's note.  

## 2022-08-11 ENCOUNTER — Encounter: Payer: BC Managed Care – PPO | Admitting: Internal Medicine

## 2022-08-11 NOTE — Progress Notes (Deleted)
DM Last hgb a1c at 10.1 6/23. Weight loss? Diet? Glargine-lixisenatide 21 units A/P: Foot exam  Cirrhosis c/p esophageal varices and resolved portal vein thrombus EGD w banding 11/01/21 no follow-up with Dr. 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 F/u 09/15/22 Last u/s? 10/22 needs another Nadolol 40 mg, Furosemide 40 mg

## 2022-10-04 ENCOUNTER — Other Ambulatory Visit: Payer: Self-pay | Admitting: Nurse Practitioner

## 2022-10-04 DIAGNOSIS — I8510 Secondary esophageal varices without bleeding: Secondary | ICD-10-CM

## 2022-10-04 DIAGNOSIS — K7469 Other cirrhosis of liver: Secondary | ICD-10-CM

## 2022-11-15 ENCOUNTER — Other Ambulatory Visit (HOSPITAL_COMMUNITY): Payer: Self-pay

## 2022-11-15 ENCOUNTER — Ambulatory Visit (INDEPENDENT_AMBULATORY_CARE_PROVIDER_SITE_OTHER): Payer: Self-pay | Admitting: Student

## 2022-11-15 ENCOUNTER — Encounter: Payer: Self-pay | Admitting: Student

## 2022-11-15 ENCOUNTER — Other Ambulatory Visit: Payer: Self-pay

## 2022-11-15 VITALS — BP 125/74 | HR 71 | Temp 97.4°F | Wt 215.1 lb

## 2022-11-15 DIAGNOSIS — R600 Localized edema: Secondary | ICD-10-CM

## 2022-11-15 DIAGNOSIS — K7460 Unspecified cirrhosis of liver: Secondary | ICD-10-CM

## 2022-11-15 DIAGNOSIS — K259 Gastric ulcer, unspecified as acute or chronic, without hemorrhage or perforation: Secondary | ICD-10-CM

## 2022-11-15 DIAGNOSIS — D61818 Other pancytopenia: Secondary | ICD-10-CM

## 2022-11-15 DIAGNOSIS — Z0000 Encounter for general adult medical examination without abnormal findings: Secondary | ICD-10-CM

## 2022-11-15 DIAGNOSIS — E1165 Type 2 diabetes mellitus with hyperglycemia: Secondary | ICD-10-CM

## 2022-11-15 LAB — POCT GLYCOSYLATED HEMOGLOBIN (HGB A1C): HbA1c POC (<> result, manual entry): 13.4 % (ref 4.0–5.6)

## 2022-11-15 LAB — GLUCOSE, CAPILLARY: Glucose-Capillary: 178 mg/dL — ABNORMAL HIGH (ref 70–99)

## 2022-11-15 MED ORDER — NADOLOL 40 MG PO TABS
40.0000 mg | ORAL_TABLET | Freq: Every day | ORAL | 3 refills | Status: DC
  Filled 2022-11-15: qty 30, 30d supply, fill #0
  Filled 2022-11-15: qty 90, 90d supply, fill #0

## 2022-11-15 MED ORDER — LIRAGLUTIDE 18 MG/3ML ~~LOC~~ SOPN
0.6000 mg | PEN_INJECTOR | Freq: Every day | SUBCUTANEOUS | 3 refills | Status: DC
  Filled 2022-11-15 (×2): qty 3, 30d supply, fill #0

## 2022-11-15 MED ORDER — FUROSEMIDE 40 MG PO TABS
40.0000 mg | ORAL_TABLET | Freq: Every day | ORAL | 3 refills | Status: DC
  Filled 2022-11-15: qty 30, 30d supply, fill #0
  Filled 2022-11-15: qty 90, 90d supply, fill #0

## 2022-11-15 MED ORDER — UNIFINE PENTIPS 32G X 4 MM MISC
1.0000 | Freq: Every day | 2 refills | Status: DC
  Filled 2022-11-15 (×2): qty 100, 30d supply, fill #0
  Filled 2023-01-15: qty 100, 30d supply, fill #1
  Filled 2023-02-07: qty 100, 30d supply, fill #2

## 2022-11-15 MED ORDER — INSULIN GLARGINE 100 UNIT/ML SOLOSTAR PEN
25.0000 [IU] | PEN_INJECTOR | Freq: Every day | SUBCUTANEOUS | 11 refills | Status: DC
  Filled 2022-11-15: qty 6, 24d supply, fill #0
  Filled 2022-11-15: qty 15, 60d supply, fill #0

## 2022-11-15 MED ORDER — PANTOPRAZOLE SODIUM 40 MG PO TBEC
40.0000 mg | DELAYED_RELEASE_TABLET | Freq: Every day | ORAL | 3 refills | Status: DC
  Filled 2022-11-15: qty 90, 90d supply, fill #0
  Filled 2022-11-15: qty 30, 30d supply, fill #0

## 2022-11-15 NOTE — Patient Instructions (Addendum)
[place], Bi.[place], kwa kuturuhusu [place]. Leo tulijadili. . .  > Ugonjwa wa kisukari        - Warnell Forester Marzetta Board damu yako haijadhibitiwa vyema tangu ulipotembelea mara ya mwisho. Tunahitaji kubadilisha dawa zako. Tutaanzisha Lantus (insulini ya muda mrefu) vitengo 25 kila usiku na Victoza 0.6 mg kila siku. [place] Soliqua tena. [place] tena baada ya wiki 3 hadi 4 ili [place] jinsi dawa hizi zinavyofanya. > Mammografia        - Tafadhali panga mammogram yako. [place] maelezo kuhusu mashirika katika [place] ambayo hutoa huduma kwa watu bila bima.   Nimekuagiza maabara zifuatazo:  Maagizo ya Maabara       CBC yenye Diff       Glucose, capillary       POC Hbg A1C   Majaribio yaliyoagizwa leo:  Nature conservation officer yaliyoagizwa leo:  Maagizo ya Rufaa Hakuna rufaa iliyoombwa leo     Nimeagiza dawa zifuatazo/ nimebadilisha dawa zifuatazo:  Acha dawa zifuatazo: Dorita Fray wa Mk[place]ano Huu [place] ya Dawa  Insulini Glargine-Lixisenatide (SOLIQUA) 100-33 UNT-MCG/ML SOPN Mabadiliko ya tiba  pantoprazole (PROTONIX) 40 MG kibao [place] upya  furosemide (LASIX) 40 MG kibao Tengeneza upya  nadolol (CORGARD) 40 MG kibao [place] upya  Sindano ya [place] ya Insulini (UNIFINE PENTIPS) 32G X 4 MM MISC Panga Upya    Anza dawa zifuatazo: Med aliamuru mkutano huu Dawa  furosemide (LASIX) 40 MG kibao Dalili: [place] kibao 1 (jumla ya miligramu 40) kwa mdomo kila siku. Utoaji: 90 kibao Jaza tena: 3 Mpango wa IM  nadolol (CORGARD) 40 MG kibao Dalili: [place] kibao 1 (jumla ya miligramu 40) kwa mdomo kila siku. Utoaji: 90 kibao Jaza tena: 3 Mpango wa IM  pantoprazole (PROTONIX) 40 MG kibao Dalili: [place] kibao 1 (jumla ya miligramu 40) kwa mdomo kila siku. Utoaji: 90 kibao Jaza tena: 3 Mpango wa IM  liraglutide (VICTOZA) 18 MG/3ML SOPN Sig: Ingiza 0.6 mg kwenye ngozi kila siku. Utoaji: 3 ml Jaza tena: 3 Mpango wa IM  Sindano ya Kalamu ya  insulini (UNIFINE PENTIPS) 32G X 4 MM MISC Sig: Tum[place] kuingiza insulini mara moja kwa siku Utoaji: 100 kila moja Jaza tena: 2 Mpango wa IM  insulini glargine (LANTUS) 100 UNIT/ML Solostar Pen Sig: Ingiza Units [address] ngozi wakati wa [place]. Utoaji: 15 ml Kujaza tena: [place] wa IM     Ufuatiliaji: Wiki 3-4   [place]:   [place] maswali au wasiwasi wowote tafadhali piga simu kwa kliniki ya dawa za ndani kwa [phone].   [place] Blamer, DO [place]   Thank you, Ms.[place], for allowing [place] to provide your care today. Today we discussed . . .  > Diabetes       - Your blood sugars have not been well controlled since your last visit. We need to change your medications. We are going to start Lantus (long acting insulin) 25 units each night and Victoza 0.6 mg daily.  You will not take the [place] anymore.  We will see you again in 3 to 4 weeks to see how these medications are doing. > Mammogram       - Please schedule your mammogram. We have provided information about organizations in [place] that provide services for people without insurance.    I have ordered the following labs for you:  Lab Orders         CBC with Diff         Glucose, capillary  POC Hbg A1C       Tests ordered today:  None   Referrals ordered today:   Referral Orders  No referral(s) requested today      I have ordered the following medication/changed the following medications:   Stop the following medications: Medications Discontinued During This Encounter  Medication Reason   Insulin Glargine-Lixisenatide (SOLIQUA) 100-33 UNT-MCG/ML SOPN Change in therapy   pantoprazole (PROTONIX) 40 MG tablet Reorder   furosemide (LASIX) 40 MG tablet Reorder   nadolol (CORGARD) 40 MG tablet Reorder   Insulin Pen Needle (UNIFINE PENTIPS) 32G X 4 MM MISC Reorder     Start the following medications: Meds ordered this encounter  Medications    furosemide (LASIX) 40 MG tablet    Sig: Take 1 tablet (40 mg total) by mouth daily.    Dispense:  90 tablet    Refill:  3    IM Program   nadolol (CORGARD) 40 MG tablet    Sig: Take 1 tablet (40 mg total) by mouth daily.    Dispense:  90 tablet    Refill:  3    IM Program   pantoprazole (PROTONIX) 40 MG tablet    Sig: Take 1 tablet (40 mg total) by mouth daily.    Dispense:  90 tablet    Refill:  3    IM Program   liraglutide (VICTOZA) 18 MG/3ML SOPN    Sig: Inject 0.6 mg into the skin daily.    Dispense:  3 mL    Refill:  3    IM Program   Insulin Pen Needle (UNIFINE PENTIPS) 32G X 4 MM MISC    Sig: Use to inject insulin once daily    Dispense:  100 each    Refill:  2    IM Program   insulin glargine (LANTUS) 100 UNIT/ML Solostar Pen    Sig: Inject 25 Units into the skin at bedtime.    Dispense:  15 mL    Refill:  11    IM Program      Follow up: 3-4 weeks   Remember:     Should you have any questions or concerns please call the internal medicine clinic at [phone].     [place], [place]

## 2022-11-15 NOTE — Progress Notes (Signed)
CC: Follow-up diabetes  HPI:  [place] is a 51 y.o. female with PMH as below who presents to the clinic for follow-up.  Please see encounters tab for problem-based charting.  Video interpreter was used during this visit.  Past Medical History:  Diagnosis Date   Diabetes mellitus without complication ([place])    Hepatic cirrhosis ([place]) 11/27/2014   Compensated, follows with GI and hepatology   Hepatitis C virus infection 01/04/2015   History of malaria    Multiple episodes w/ 2 hospitalizations   Hypertension    Pancytopenia ([place])    Secondary to cirrhosis   Splenomegaly 11/27/2014   Secondary to cirrhosis   Wound of left foot 11/27/2019   Review of Systems:   A comprehensive review of systems was negative except for: stable lower extremity edema    Physical Exam:  Vitals:   11/15/22 0842  BP: 125/74  Pulse: 71  Temp: (!) 97.4 F (36.3 C)  TempSrc: Oral  SpO2: 100%  Weight: 215 lb 1.6 oz (97.6 kg)    Constitutional: Well-appearing obese middle-aged female. In no acute distress. HENT: Normocephalic, atraumatic,  Eyes: Sclera non-icteric, EOM intact Cardio:Regular rate and rhythm. No murmurs, rubs, or gallops. 2+ bilateral radial and dorsalis pedis  pulses. Pulm:Clear to auscultation bilaterally. Normal work of breathing on room air. Abdomen: Soft, non-tender, non-distended, positive bowel sounds. MSK: From nonpitting edema in the lower extremities bilaterally consistent with lipedema, no signs of ischemic skin changes Skin:Warm and dry. Neuro:Alert and oriented x3. No focal deficit noted. Psych:Pleasant mood and affect.   Assessment & Plan:   Hepatic cirrhosis ([place]) Patient with a history of cirrhosis with esophageal varices on nadolol 40 mg daily and Lasix 40 mg daily.  She last saw her hepatologist on 10/04/2022.  They are planning to get a right upper quadrant ultrasound for monitoring and do an AFP which is stable at 5.3, PT/INR within normal  limits, and albumin of 4.5.  Patient still has thrombocytopenia that is stable. - Continue nadolol 40 mg daily and Lasix 40 mg daily  Type 2 diabetes mellitus ([place]) Patient presents for follow-up and repeat A1c which has risen from 10.1 6 months ago to 13.4 today.  She has been on Soliqua 20 units nightly.  She states her sugars have been 120-200 in the mornings and this is consistent with her glucometer record.  She is not having any symptoms of polyuria or polydipsia or any new or worsening neuropathy.  Unfortunately she has also lost her insurance recently.  Foot exam today was normal. - Stop Soliqua - Start Lantus 25 units nightly and Victoza 0.6 mg to increase to 1.2 mg in 1 week - Return in 3 to 4 weeks for reevaluation, titration of insulin, and to ensure increasing Victoza  Pancytopenia Patient with stable pancytopenia which is characterized by marked leukopenia/neutropenia with white blood cells consistently below 2 and thrombocytopenia with platelets consistently below 50.  CBC today showed stable numbers.  She has recently been evaluated by hematology and oncology with the most likely cause of these being her cirrhosis secondary to chronic hepatitis C infection.  She is currently stable and we will continue to monitor this. - CBC today  Health care maintenance Patient has been recommended to get a mammogram but unfortunately lost her insurance recently.  She was given information on programs in [place] that provide mammograms for people without insurance.  Please readdress this at her next visit to ensure she has been able to schedule this.  Patient seen with Dr. Willia Craze, [facility] Resident PGY-1 Pager: [phone]

## 2022-11-16 ENCOUNTER — Other Ambulatory Visit (HOSPITAL_COMMUNITY): Payer: Self-pay

## 2022-11-16 LAB — CBC WITH DIFFERENTIAL/PLATELET
Basophils Absolute: 0 10*3/uL (ref 0.0–0.2)
Basos: 0 %
EOS (ABSOLUTE): 0 10*3/uL (ref 0.0–0.4)
Eos: 1 %
Hematocrit: 36.4 % (ref 34.0–46.6)
Hemoglobin: 11.8 g/dL (ref 11.1–15.9)
Immature Grans (Abs): 0 10*3/uL (ref 0.0–0.1)
Immature Granulocytes: 0 %
Lymphocytes Absolute: 0.7 10*3/uL (ref 0.7–3.1)
Lymphs: 45 %
MCH: 26.2 pg — ABNORMAL LOW (ref 26.6–33.0)
MCHC: 32.4 g/dL (ref 31.5–35.7)
MCV: 81 fL (ref 79–97)
Monocytes Absolute: 0.1 10*3/uL (ref 0.1–0.9)
Monocytes: 7 %
Neutrophils Absolute: 0.7 10*3/uL — ABNORMAL LOW (ref 1.4–7.0)
Neutrophils: 47 %
Platelets: 35 10*3/uL — CL (ref 150–450)
RBC: 4.5 x10E6/uL (ref 3.77–5.28)
RDW: 13.9 % (ref 11.7–15.4)
WBC: 1.5 10*3/uL — CL (ref 3.4–10.8)

## 2022-11-17 NOTE — Assessment & Plan Note (Addendum)
Patient presents for follow-up and repeat A1c which has risen from 10.1 6 months ago to 13.4 today.  She has been on Soliqua 20 units nightly.  She states her sugars have been 120-200 in the mornings and this is consistent with her glucometer record.  She is not having any symptoms of polyuria or polydipsia or any new or worsening neuropathy.  Unfortunately she has also lost her insurance recently.  Foot exam today was normal. - Stop Soliqua - Start Lantus 25 units nightly and Victoza 0.6 mg to increase to 1.2 mg in 1 week - Return in 3 to 4 weeks for reevaluation, titration of insulin, and to ensure increasing Victoza

## 2022-11-17 NOTE — Assessment & Plan Note (Signed)
Patient with stable pancytopenia which is characterized by marked leukopenia/neutropenia with white blood cells consistently below 2 and thrombocytopenia with platelets consistently below 50.  CBC today showed stable numbers.  She has recently been evaluated by hematology and oncology with the most likely cause of these being her cirrhosis secondary to chronic hepatitis C infection.  She is currently stable and we will continue to monitor this. - CBC today

## 2022-11-17 NOTE — Progress Notes (Signed)
Internal Medicine Clinic Attending  I saw and evaluated the patient.  I personally confirmed the key portions of the history and exam documented by Dr. Goodwin and I reviewed pertinent patient test results.  The assessment, diagnosis, and plan were formulated together and I agree with the documentation in the resident's note.  

## 2022-11-17 NOTE — Assessment & Plan Note (Signed)
Patient has been recommended to get a mammogram but unfortunately lost her insurance recently.  She was given information on programs in [place] that provide mammograms for people without insurance.  Please readdress this at her next visit to ensure she has been able to schedule this.

## 2022-11-17 NOTE — Assessment & Plan Note (Signed)
Patient with a history of cirrhosis with esophageal varices on nadolol 40 mg daily and Lasix 40 mg daily.  She last saw her hepatologist on 10/04/2022.  They are planning to get a right upper quadrant ultrasound for monitoring and do an AFP which is stable at 5.3, PT/INR within normal limits, and albumin of 4.5.  Patient still has thrombocytopenia that is stable. - Continue nadolol 40 mg daily and Lasix 40 mg daily

## 2022-11-20 NOTE — Progress Notes (Signed)
Using a telephone interpreter service I called and spoke to the patient.  I informed her of her low white blood cell count and low platelet count.  These are stable and chronic issues most likely secondary to her chronic hepatitis C infection which has caused cirrhosis.  She has been seen by hematology for this in the past.  No changes at this time.  All questions were answered.

## 2022-12-18 ENCOUNTER — Other Ambulatory Visit: Payer: Self-pay | Admitting: *Deleted

## 2022-12-18 ENCOUNTER — Other Ambulatory Visit (HOSPITAL_COMMUNITY): Payer: Self-pay

## 2022-12-18 DIAGNOSIS — K259 Gastric ulcer, unspecified as acute or chronic, without hemorrhage or perforation: Secondary | ICD-10-CM

## 2022-12-18 MED ORDER — PANTOPRAZOLE SODIUM 40 MG PO TBEC
40.0000 mg | DELAYED_RELEASE_TABLET | Freq: Every day | ORAL | 3 refills | Status: DC
  Filled 2022-12-18: qty 30, 30d supply, fill #0
  Filled 2023-01-15 – 2023-01-18 (×2): qty 30, 30d supply, fill #1

## 2022-12-19 ENCOUNTER — Other Ambulatory Visit: Payer: Self-pay

## 2022-12-19 ENCOUNTER — Other Ambulatory Visit (HOSPITAL_COMMUNITY): Payer: Self-pay

## 2022-12-19 ENCOUNTER — Ambulatory Visit (INDEPENDENT_AMBULATORY_CARE_PROVIDER_SITE_OTHER): Payer: Self-pay

## 2022-12-19 VITALS — BP 124/71 | HR 84 | Temp 97.7°F | Ht 63.0 in | Wt 217.1 lb

## 2022-12-19 DIAGNOSIS — K7460 Unspecified cirrhosis of liver: Secondary | ICD-10-CM

## 2022-12-19 DIAGNOSIS — Z7984 Long term (current) use of oral hypoglycemic drugs: Secondary | ICD-10-CM

## 2022-12-19 DIAGNOSIS — Z0000 Encounter for general adult medical examination without abnormal findings: Secondary | ICD-10-CM

## 2022-12-19 DIAGNOSIS — Z23 Encounter for immunization: Secondary | ICD-10-CM

## 2022-12-19 DIAGNOSIS — E1165 Type 2 diabetes mellitus with hyperglycemia: Secondary | ICD-10-CM

## 2022-12-19 DIAGNOSIS — Z794 Long term (current) use of insulin: Secondary | ICD-10-CM

## 2022-12-19 DIAGNOSIS — R600 Localized edema: Secondary | ICD-10-CM

## 2022-12-19 MED ORDER — INSULIN GLARGINE 100 UNIT/ML SOLOSTAR PEN
25.0000 [IU] | PEN_INJECTOR | Freq: Every day | SUBCUTANEOUS | 2 refills | Status: DC
  Filled 2022-12-19: qty 6, 24d supply, fill #0
  Filled 2023-01-15 – 2023-01-18 (×2): qty 6, 24d supply, fill #1
  Filled 2023-02-03 – 2023-02-07 (×2): qty 6, 24d supply, fill #2

## 2022-12-19 MED ORDER — LIRAGLUTIDE 18 MG/3ML ~~LOC~~ SOPN
0.6000 mg | PEN_INJECTOR | Freq: Every day | SUBCUTANEOUS | 2 refills | Status: DC
  Filled 2022-12-19: qty 6, 60d supply, fill #0
  Filled 2023-02-20: qty 3, 30d supply, fill #1
  Filled 2023-02-24: qty 6, 60d supply, fill #1

## 2022-12-19 MED ORDER — NADOLOL 40 MG PO TABS
40.0000 mg | ORAL_TABLET | Freq: Every day | ORAL | 0 refills | Status: DC
  Filled 2022-12-19: qty 30, 30d supply, fill #0
  Filled 2023-01-15 – 2023-01-18 (×2): qty 30, 30d supply, fill #1

## 2022-12-19 MED ORDER — FUROSEMIDE 40 MG PO TABS
40.0000 mg | ORAL_TABLET | Freq: Every day | ORAL | 0 refills | Status: DC
  Filled 2022-12-19: qty 30, 30d supply, fill #0
  Filled 2023-01-15 – 2023-01-18 (×2): qty 30, 30d supply, fill #1
  Filled 2023-02-03 – 2023-02-09 (×2): qty 30, 30d supply, fill #2

## 2022-12-19 NOTE — Patient Instructions (Signed)
[name], it was a pleasure seeing you today! You endorsed feeling well today. Below are some of the things we talked about this visit. We look forward to seeing you in the follow up appointment!  Today we discussed: Diabetes: Your blood sugars all look good. Please continue your current diabetes treatment. I sent your refills to the [facility] pharmacy, please give [place] a call if you have any issues.  I have ordered the following labs today:  Lab Orders  No laboratory test(s) ordered today      Referrals ordered today:   Referral Orders  No referral(s) requested today     I have ordered the following medication/changed the following medications:   Stop the following medications: Medications Discontinued During This Encounter  Medication Reason   furosemide (LASIX) 40 MG tablet Reorder   nadolol (CORGARD) 40 MG tablet Reorder   liraglutide (VICTOZA) 18 MG/3ML SOPN Reorder   insulin glargine (LANTUS) 100 UNIT/ML Solostar Pen Reorder     Start the following medications: Meds ordered this encounter  Medications   insulin glargine (LANTUS) 100 UNIT/ML Solostar Pen    Sig: Inject 25 Units into the skin at bedtime.    Dispense:  15 mL    Refill:  2    IM Program   liraglutide (VICTOZA) 18 MG/3ML SOPN    Sig: Inject 0.6 mg into the skin daily.    Dispense:  3 mL    Refill:  2    IM Program   furosemide (LASIX) 40 MG tablet    Sig: Take 1 tablet (40 mg total) by mouth daily.    Dispense:  90 tablet    Refill:  0    IM Program   nadolol (CORGARD) 40 MG tablet    Sig: Take 1 tablet (40 mg total) by mouth daily.    Dispense:  90 tablet    Refill:  0    IM Program     Follow-up: 2-3 months for diabetes followup a1c   Please make sure to arrive 15 minutes prior to your next appointment. If you arrive late, you may be asked to reschedule.   We look forward to seeing you next time. Please call our clinic at [phone] if you have any questions or concerns. The best  time to call is Monday-Friday from 9am-4pm, but there is someone available 24/7. If after hours or the weekend, call the main hospital number and ask for the Internal Medicine Resident On-Call. If you need medication refills, please notify your pharmacy one week in advance and they will send [place] a request.  Thank you for letting us take part in your care. Wishing you the best!  Thank you, Linus Galas MD

## 2022-12-21 NOTE — Assessment & Plan Note (Signed)
No BLE edema or abdominal distention today. On lasix '40mg'$  and nadolol '40mg'$  daily for esophageal varices and cirrhosis. Refilled today.

## 2022-12-21 NOTE — Assessment & Plan Note (Signed)
A1c last month 13.4, regimen changed to victoza 0.'6mg'$  daily,lantus 25units QHS. Her meter shows daily morning reads almost all in goal range and no lows on this regimen. She is working on filling out orange form paperwork and applying for medicaid, after which we can refer to ophthalmology for eye exam and get more labs. Refilled meds today.

## 2022-12-21 NOTE — Progress Notes (Signed)
   CC: diabetes f\u  HPI:  Katherine Garza is a 52 y.o.-year-old female with past medical history as below presenting for diabetes followup.  Please see encounters tab for problem-based charting.  Past Medical History:  Diagnosis Date   Diabetes mellitus without complication ([place])    Hepatic cirrhosis ([place]) 11/27/2014   Compensated, follows with GI and hepatology   Hepatitis C virus infection 01/04/2015   History of malaria    Multiple episodes w/ 2 hospitalizations   Hypertension    Pancytopenia ([place])    Secondary to cirrhosis   Splenomegaly 11/27/2014   Secondary to cirrhosis   Wound of left foot 11/27/2019   Review of Systems: As in HPI.  Please see encounters tab for problem based charting.  Physical Exam:  Vitals:   12/19/22 0956  BP: 124/71  Pulse: 84  Temp: 97.7 F (36.5 C)  TempSrc: Oral  SpO2: 96%  Weight: 217 lb 1.6 oz (98.5 kg)  Height: '5\' 3"'$  (1.6 m)   General:Well-appearing, pleasant, In NAD Cardiac: RRR, no murmurs rubs or gallops. Respiratory: Normal work of breathing on room air, CTAB Abdominal: Soft, nontender, nondistended Extremities: no BLE edema, DP pulses intact  Assessment & Plan:   Type 2 diabetes mellitus (HCC) A1c last month 13.4, regimen changed to victoza 0.'6mg'$  daily,lantus 25units QHS. Her meter shows daily morning reads almost all in goal range and no lows on this regimen. She is working on filling out orange form paperwork and applying for medicaid, after which we can refer to ophthalmology for eye exam and get more labs. Refilled meds today.  Hepatic cirrhosis (HCC) No BLE edema or abdominal distention today. On lasix '40mg'$  and nadolol '40mg'$  daily for esophageal varices and cirrhosis. Refilled today.  Health care maintenance Flu shot today, she was not able to get mammogram after last visit. Will refer for screening mammography after she gets orange card/applies for medicaid.   Patient discussed with Dr. Philipp Ovens

## 2022-12-21 NOTE — Assessment & Plan Note (Signed)
Flu shot today, she was not able to get mammogram after last visit. Will refer for screening mammography after she gets orange card/applies for medicaid.

## 2022-12-22 ENCOUNTER — Other Ambulatory Visit (HOSPITAL_COMMUNITY): Payer: Self-pay

## 2022-12-22 NOTE — Progress Notes (Signed)
Internal Medicine Clinic Attending  Case discussed with Dr. Jodell Cipro  At the time of the visit.  We reviewed the resident's history and exam and pertinent patient test results.  I agree with the assessment, diagnosis, and plan of care documented in the resident's note.

## 2022-12-27 ENCOUNTER — Encounter: Payer: Medicaid Other | Admitting: Student

## 2023-01-15 ENCOUNTER — Other Ambulatory Visit (HOSPITAL_COMMUNITY): Payer: Self-pay

## 2023-01-18 ENCOUNTER — Other Ambulatory Visit (HOSPITAL_COMMUNITY): Payer: Self-pay

## 2023-01-19 ENCOUNTER — Other Ambulatory Visit (HOSPITAL_COMMUNITY): Payer: Self-pay

## 2023-01-25 ENCOUNTER — Other Ambulatory Visit (HOSPITAL_COMMUNITY): Payer: Self-pay

## 2023-01-29 ENCOUNTER — Other Ambulatory Visit (HOSPITAL_COMMUNITY): Payer: Self-pay

## 2023-02-03 ENCOUNTER — Other Ambulatory Visit (HOSPITAL_COMMUNITY): Payer: Self-pay

## 2023-02-05 ENCOUNTER — Other Ambulatory Visit: Payer: Self-pay

## 2023-02-07 ENCOUNTER — Other Ambulatory Visit (HOSPITAL_COMMUNITY): Payer: Self-pay

## 2023-02-09 ENCOUNTER — Other Ambulatory Visit (HOSPITAL_COMMUNITY): Payer: Self-pay

## 2023-02-12 ENCOUNTER — Other Ambulatory Visit (HOSPITAL_COMMUNITY): Payer: Self-pay

## 2023-02-13 ENCOUNTER — Other Ambulatory Visit (HOSPITAL_COMMUNITY): Payer: Self-pay

## 2023-02-16 ENCOUNTER — Other Ambulatory Visit: Payer: Self-pay

## 2023-02-16 ENCOUNTER — Ambulatory Visit: Payer: 59

## 2023-02-16 ENCOUNTER — Other Ambulatory Visit (HOSPITAL_COMMUNITY): Payer: Self-pay

## 2023-02-16 VITALS — BP 119/77 | HR 68 | Temp 98.1°F | Resp 24 | Ht 63.0 in | Wt 216.3 lb

## 2023-02-16 DIAGNOSIS — K7460 Unspecified cirrhosis of liver: Secondary | ICD-10-CM | POA: Diagnosis not present

## 2023-02-16 DIAGNOSIS — E1165 Type 2 diabetes mellitus with hyperglycemia: Secondary | ICD-10-CM

## 2023-02-16 DIAGNOSIS — R600 Localized edema: Secondary | ICD-10-CM | POA: Diagnosis not present

## 2023-02-16 DIAGNOSIS — K259 Gastric ulcer, unspecified as acute or chronic, without hemorrhage or perforation: Secondary | ICD-10-CM

## 2023-02-16 DIAGNOSIS — E1169 Type 2 diabetes mellitus with other specified complication: Secondary | ICD-10-CM

## 2023-02-16 DIAGNOSIS — E785 Hyperlipidemia, unspecified: Secondary | ICD-10-CM

## 2023-02-16 DIAGNOSIS — Z794 Long term (current) use of insulin: Secondary | ICD-10-CM

## 2023-02-16 LAB — POCT GLYCOSYLATED HEMOGLOBIN (HGB A1C): Hemoglobin A1C: 10.3 % — AB (ref 4.0–5.6)

## 2023-02-16 LAB — GLUCOSE, CAPILLARY: Glucose-Capillary: 81 mg/dL (ref 70–99)

## 2023-02-16 MED ORDER — FUROSEMIDE 40 MG PO TABS
40.0000 mg | ORAL_TABLET | Freq: Every day | ORAL | 0 refills | Status: DC
  Filled 2023-02-16: qty 90, 90d supply, fill #0

## 2023-02-16 MED ORDER — INSULIN GLARGINE 100 UNIT/ML SOLOSTAR PEN
25.0000 [IU] | PEN_INJECTOR | Freq: Every day | SUBCUTANEOUS | 2 refills | Status: DC
  Filled 2023-02-16: qty 15, 60d supply, fill #0

## 2023-02-16 MED ORDER — NADOLOL 40 MG PO TABS
40.0000 mg | ORAL_TABLET | Freq: Every day | ORAL | 0 refills | Status: DC
  Filled 2023-02-16: qty 60, 60d supply, fill #0
  Filled 2023-02-16 (×2): qty 30, 30d supply, fill #0

## 2023-02-16 MED ORDER — PANTOPRAZOLE SODIUM 40 MG PO TBEC
40.0000 mg | DELAYED_RELEASE_TABLET | Freq: Every day | ORAL | 3 refills | Status: DC
  Filled 2023-02-16: qty 30, 30d supply, fill #0
  Filled 2023-02-16: qty 90, 90d supply, fill #0

## 2023-02-16 MED ORDER — UNIFINE PENTIPS 32G X 4 MM MISC
1.0000 | Freq: Every day | 2 refills | Status: DC
  Filled 2023-02-16 – 2023-05-04 (×2): qty 100, 30d supply, fill #0
  Filled 2023-07-02: qty 100, 30d supply, fill #1
  Filled 2023-08-07: qty 100, 30d supply, fill #2

## 2023-02-16 NOTE — Patient Instructions (Addendum)
Katherine Garza, Katherine Garza kwa kuturuhusu [employer]. Katherine Garza tulijadili:  Kisukari: Katherine Garza mita kwenye mkono wako [id number] inafuatilia sukari yako ya damu Katherine Garza kwa wiki 2. Mtu anayeweka hii hayupo hapa, tutahitaji kukurudisha Jumatatu kwa hili. A1c yako iko juu na juu ya lengo letu la 7%. Sitafanya mabadiliko kwa dawa hadi nione data hii.  Tutaangalia utendakazi wako wa cholesterol na figo leo. Nimejaza tena dawa zako.  Nimekuagiza Katherine Garza zifuatazo:   Maagizo ya Maabara      Glucose, capillary      CMP14 + Pengo la Anion      Profaili ya Lipid      POC Hbg A1C   Nimeagiza dawa zifuatazo/ nimebadilisha dawa zifuatazo:  Acha dawa zifuatazo: Dorita Fray wa Mkutano Huu Sababu ya Dawa  Sindano ya [place] ya Insulini (UNIFINE PENTIPS) 32G X 4 MM MISC Panga Upya  pantoprazole (PROTONIX) 40 MG kibao [place] upya  insulini glargine (LANTUS) 100 UNIT/ML Solostar Pen Upya  furosemide (LASIX) 40 MG kibao Tengeneza upya  nadolol (CORGARD) 40 MG kibao [place] upya    Anza dawa zifuatazo: Med aliamuru mkutano huu Dawa  furosemide (LASIX) 40 MG kibao Dalili: [place] kibao 1 (jumla ya miligramu 40) kwa mdomo kila siku. Utoaji: 90 kibao Jaza tena: 0 Mpango wa IM  insulini glargine (LANTUS) 100 UNIT/ML Solostar Pen Sig: Ingiza Units [address] ngozi wakati wa [place]. Utoaji: 15 ml Jaza tena: 2 Mpango wa IM  Sindano ya kalamu ya insulini (UNIFINE PENTIPS) 32G X 4 MM MISC Sig: Tumia kuingiza insulini mara moja kwa siku Utoaji: 100 kila moja Jaza tena: 2 Mpango wa IM  nadolol (CORGARD) 40 MG kibao Dalili: [place] kibao 1 (jumla ya miligramu 40) kwa mdomo kila siku. Utoaji: 90 kibao Jaza tena: 0 Mpango wa IM  pantoprazole (PROTONIX) 40 MG kibao Dalili: [place] kibao 1 (jumla ya miligramu 40) kwa mdomo kila siku. Utoaji: 90 kibao Jaza tena: 3 Mpango wa IM    Ufuatiliaji: miezi 3    [place]. Tafadhali pigia kliniki yetu  kwa [phone] Katherine Garza maswali au wasiwasi wowote. Wakati mzuri wa [place] simu ni Jumatatu-Ijumaa [place] 9am-4pm, lakini [place] mtu [employer]. Ikiwa baada ya saa au wikendi, piga simu kwa nambari kuu ya hospitali na umwulize Katherine Garza wa Dawa ya Ndani kwenye Simu. Ikiwa unahitaji kujazwa tena na dawa, Katherine Garza duka lako la dawa wiki moja kabla na watatutumia ombi.  Katherine Garza kwa kuniamini na utunzaji wako. [place]ut[place] kila la [place]!  Katherine Coach, MD [place]  Thank you, Katherine Garza for allowing [place] to provide your care today. Today we discussed :  Diabetes: I want to have a meter placed on your arm that tracks your blood sugar constantly for 2 weeks. The person who places this is not here, we will need to have you come back Monday for this. Your A1c is high and above our goal of 7%. I will not make changes to medicines until seeing this data.  We will check your cholesterol and kidney function today. I have refilled your medicines.  I have ordered the following labs for you:   Lab Orders         Glucose, capillary         CMP14 + Anion Gap         Lipid Profile         POC Hbg A1C       I have ordered the following medication/changed the following medications:  Stop the following medications: Medications Discontinued During This Encounter  Medication Reason   Insulin Pen Needle (UNIFINE PENTIPS) 32G X 4 MM MISC Reorder   pantoprazole (PROTONIX) 40 MG tablet Reorder   insulin glargine (LANTUS) 100 UNIT/ML Solostar Pen Reorder   furosemide (LASIX) 40 MG tablet Reorder   nadolol (CORGARD) 40 MG tablet Reorder     Start the following medications: Meds ordered this encounter  Medications   furosemide (LASIX) 40 MG tablet    Sig: Take 1 tablet (40 mg total) by mouth daily.    Dispense:  90 tablet    Refill:  0    IM Program   insulin glargine (LANTUS) 100 UNIT/ML Solostar Pen    Sig: Inject 25 Units into the skin at bedtime.     Dispense:  15 mL    Refill:  2    IM Program   Insulin Pen Needle (UNIFINE PENTIPS) 32G X 4 MM MISC    Sig: Use to inject insulin once daily    Dispense:  100 each    Refill:  2    IM Program   nadolol (CORGARD) 40 MG tablet    Sig: Take 1 tablet (40 mg total) by mouth daily.    Dispense:  90 tablet    Refill:  0    IM Program   pantoprazole (PROTONIX) 40 MG tablet    Sig: Take 1 tablet (40 mg total) by mouth daily.    Dispense:  90 tablet    Refill:  3    IM Program     Follow up: 3 months     We look forward to seeing you next time. Please call our clinic at [phone] if you have any questions or concerns. The best time to call is Monday-Friday from 9am-4pm, but there is someone available 24/7. If after hours or the weekend, call the main hospital number and ask for the Internal Medicine Resident On-Call. If you need medication refills, please notify your pharmacy one week in advance and they will send [place] a request.   Thank you for trusting me with your care. Wishing you the best!   Katherine Coach, MD [place]

## 2023-02-16 NOTE — Assessment & Plan Note (Signed)
Following with [facility]. Patient denies hematemesis, melena, hematochezia, abdominal swelling, LE swelling, confusion. On exam no scleral icterus, no ptp of the abdomen, no dullness to percussion, no pitting LE edema but was noted to have hemosiderin staining and general swelling of the legs. Most recent CBC wnl 11/2022. Patient due for eGFR for diabetes, will get CMP to evaluate liver function as well. [place] ultrasound screening ordered by GI. Variceal screening with EGD per GI. -Continue nadolol 40 mg daily.  -continue lasix 40

## 2023-02-16 NOTE — Assessment & Plan Note (Signed)
A1c now 10.3. She checks her glucose around 10:30 AM prior to eating every morning. Range primarily 100-120. Appears during past visits there was similar discordance between glucose levels at home and A1c. She has been taking lantus 25 nightly, never picked up victoza. Has not tried metformin. Will not make med changes. Do think she would benefit from professional CGM placement to identify where her sugars are running high. Butch Penny not in office today. Scheduled follow up with Butch Penny Tuesday 3/12 at 9:15 am for placement. -lantus 25u nightly -professional CGM placement Tuesday 3/12 -optho referral placed

## 2023-02-16 NOTE — Progress Notes (Deleted)
Esophageal varices cirhossis secondary to treated HCV Stable. Patient denies any melena, hemoptysis or hematochezia. The most recent EGD in November 2022 showed grade 1 esophageal varices. She previously underwent band ligation for grade 3 varices -Continue nadolol 40 mg daily.  -lasix 40 HCC screen? Ordered .EGD screen? [facility] 09/2022  T2DM A1c last month 13.4, regimen changed to victoza 0.'6mg'$  daily,lantus 25units QHS. Her meter shows daily morning reads almost all in goal range and no lows on this regimen. She is working on filling out orange form paperwork and applying for medicaid, after which we can refer to ophthalmology for eye exam and get more labs. Refilled meds today.  -LDL + statin? ASCVD risk 6.7%, LDL 115 06/2021  IDA?  HCM Optho Mammo-ordered eGFR

## 2023-02-16 NOTE — Progress Notes (Signed)
Established Patient Office Visit  Subjective   Patient ID: Katherine Garza, female    DOB: [birth year]  Age: 52 y.o. MRN: [id number]  Chief Complaint  Patient presents with   Follow-up   Medication Refill    [name] is a 52 y/o female with a pmh outlined below. Please see A&P for HPI information.  Medication Refill      Review of Systems  All other systems reviewed and are negative.     Objective:     BP 119/77 (BP Location: Right Arm, Patient Position: Sitting, Cuff Size: Large)   Pulse 68   Temp 98.1 F (36.7 C) (Oral)   Resp (!) 24   Ht '5\' 3"'$  (1.6 m)   Wt 216 lb 4.8 oz (98.1 kg)   LMP  (LMP Unknown) Comment: no english  SpO2 100% Comment: room air  BMI 38.32 kg/m    Physical Exam Constitutional:      General: She is not in acute distress.    Appearance: Normal appearance. She is obese.  Eyes:     General: No scleral icterus.    Extraocular Movements: Extraocular movements intact.     Conjunctiva/sclera: Conjunctivae normal.  Cardiovascular:     Rate and Rhythm: Normal rate and regular rhythm.     Pulses: Normal pulses.     Heart sounds: Normal heart sounds. No murmur heard.    No gallop.  Pulmonary:     Effort: Pulmonary effort is normal. No respiratory distress.     Breath sounds: Normal breath sounds. No wheezing or rales.  Abdominal:     General: Bowel sounds are normal. There is distension.     Palpations: Abdomen is soft. There is no mass.     Tenderness: There is no abdominal tenderness. There is no guarding or rebound.     Hernia: No hernia is present.     Comments: Grossly tympanitic to percussion, no dullness to percussion.  Musculoskeletal:     Comments: Bilateral non-pitting swelling of the LE with hemosiderin staining about the distal tibia and ankles  Skin:    General: Skin is warm and dry.     Capillary Refill: Capillary refill takes less than 2 seconds.     Coloration: Skin is not jaundiced.  Neurological:     General:  No focal deficit present.     Mental Status: She is alert.     Comments: No asterixis      Results for orders placed or performed in visit on 02/16/23  Glucose, capillary  Result Value Ref Range   Glucose-Capillary 81 70 - 99 mg/dL  POC Hbg A1C  Result Value Ref Range   Hemoglobin A1C 10.3 (A) 4.0 - 5.6 %   HbA1c POC (<> result, manual entry)     HbA1c, POC (prediabetic range)     HbA1c, POC (controlled diabetic range)        The 10-year ASCVD risk score (Arnett DK, et al., 2019) is: 7%    Assessment & Plan:   Problem List Items Addressed This Visit       Digestive   Hepatic cirrhosis ([place]) (Chronic)    Following with [facility]. Patient denies hematemesis, melena, hematochezia, abdominal swelling, LE swelling, confusion. On exam no scleral icterus, no ptp of the abdomen, no dullness to percussion, no pitting LE edema but was noted to have hemosiderin staining and general swelling of the legs. Most recent CBC wnl 11/2022. Patient due for eGFR for diabetes, will get CMP to  evaluate liver function as well. [place] ultrasound screening ordered by GI. Variceal screening with EGD per GI. -Continue nadolol 40 mg daily.  -continue lasix 40      Relevant Medications   furosemide (LASIX) 40 MG tablet   nadolol (CORGARD) 40 MG tablet   Other Relevant Orders   CMP14 + Anion Gap   Multiple gastric ulcers   Relevant Medications   pantoprazole (PROTONIX) 40 MG tablet     Endocrine   Type 2 diabetes mellitus (HCC) - Primary    A1c now 10.3. She checks her glucose around 10:30 AM prior to eating every morning. Range primarily 100-120. Appears during past visits there was similar discordance between glucose levels at home and A1c. She has been taking lantus 25 nightly, never picked up victoza. Has not tried metformin. Will not make med changes. Do think she would benefit from professional CGM placement to identify where her sugars are running high. Butch Penny not in office today. Scheduled  follow up with Butch Penny Tuesday 3/12 at 9:15 am for placement. -lantus 25u nightly -professional CGM placement Tuesday 3/12 -optho referral placed       Relevant Medications   insulin glargine (LANTUS) 100 UNIT/ML Solostar Pen   Insulin Pen Needle (UNIFINE PENTIPS) 32G X 4 MM MISC   Other Relevant Orders   POC Hbg A1C (Completed)   CMP14 + Anion Gap   Lipid Profile   Ambulatory referral to Ophthalmology   Ambulatory referral to diabetic education     Other   Bilateral lower extremity edema (Chronic)   Relevant Medications   furosemide (LASIX) 40 MG tablet   Hyperlipidemia    Most recent LDL 06/2021 was 115 above goal of <100 for primary prevention. ASCVD risk at that time was relatively low at 6.7%. Moderate intensity statin indicated with T2DM. Patient does have cirrhosis so this has not been started in the past. Can be started for child pugh A with no adjustment and for child pugh B at 5 initial with a max dose of '10mg'$  daily. Recently seen at GI 09/2022 with child pugh A and MELD 7. Will get lipid panel today. Discuss at follow up about starting statin. -f/u LDL -discuss statin      Relevant Medications   furosemide (LASIX) 40 MG tablet   nadolol (CORGARD) 40 MG tablet   Other Visit Diagnoses     Hyperlipidemia associated with type 2 diabetes mellitus (HCC)       Relevant Medications   furosemide (LASIX) 40 MG tablet   insulin glargine (LANTUS) 100 UNIT/ML Solostar Pen   nadolol (CORGARD) 40 MG tablet   Other Relevant Orders   Lipid Profile       Return in about 3 months (around 05/19/2023).    Iona Coach, MD

## 2023-02-16 NOTE — Assessment & Plan Note (Signed)
Most recent LDL 06/2021 was 115 above goal of <100 for primary prevention. ASCVD risk at that time was relatively low at 6.7%. Moderate intensity statin indicated with T2DM. Patient does have cirrhosis so this has not been started in the past. Can be started for child pugh A with no adjustment and for child pugh B at 5 initial with a max dose of '10mg'$  daily. Recently seen at GI 09/2022 with child pugh A and MELD 7. Will get lipid panel today. Discuss at follow up about starting statin. -f/u LDL -discuss statin

## 2023-02-17 LAB — CMP14 + ANION GAP
ALT: 23 IU/L (ref 0–32)
AST: 23 IU/L (ref 0–40)
Albumin/Globulin Ratio: 1.5 (ref 1.2–2.2)
Albumin: 4.6 g/dL (ref 3.8–4.9)
Alkaline Phosphatase: 64 IU/L (ref 44–121)
Anion Gap: 15 mmol/L (ref 10.0–18.0)
BUN/Creatinine Ratio: 21 (ref 9–23)
BUN: 13 mg/dL (ref 6–24)
Bilirubin Total: 0.8 mg/dL (ref 0.0–1.2)
CO2: 25 mmol/L (ref 20–29)
Calcium: 9.2 mg/dL (ref 8.7–10.2)
Chloride: 101 mmol/L (ref 96–106)
Creatinine, Ser: 0.61 mg/dL (ref 0.57–1.00)
Globulin, Total: 3 g/dL (ref 1.5–4.5)
Glucose: 95 mg/dL (ref 70–99)
Potassium: 3.9 mmol/L (ref 3.5–5.2)
Sodium: 141 mmol/L (ref 134–144)
Total Protein: 7.6 g/dL (ref 6.0–8.5)
eGFR: 106 mL/min/{1.73_m2} (ref >59)

## 2023-02-17 LAB — LIPID PANEL
Chol/HDL Ratio: 4.6 ratio — ABNORMAL HIGH (ref 0.0–4.4)
Cholesterol, Total: 160 mg/dL (ref 100–199)
HDL: 35 mg/dL — ABNORMAL LOW (ref >39)
LDL Chol Calc (NIH): 111 mg/dL — ABNORMAL HIGH (ref 0–99)
Triglycerides: 74 mg/dL (ref 0–149)
VLDL Cholesterol Cal: 14 mg/dL (ref 5–40)

## 2023-02-20 ENCOUNTER — Other Ambulatory Visit (HOSPITAL_COMMUNITY): Payer: Self-pay

## 2023-02-20 ENCOUNTER — Encounter: Payer: Self-pay | Admitting: Dietician

## 2023-02-20 ENCOUNTER — Telehealth: Payer: Self-pay | Admitting: Dietician

## 2023-02-20 ENCOUNTER — Ambulatory Visit (INDEPENDENT_AMBULATORY_CARE_PROVIDER_SITE_OTHER): Payer: 59 | Admitting: Dietician

## 2023-02-20 DIAGNOSIS — Z9641 Presence of insulin pump (external) (internal): Secondary | ICD-10-CM

## 2023-02-20 DIAGNOSIS — E119 Type 2 diabetes mellitus without complications: Secondary | ICD-10-CM | POA: Diagnosis not present

## 2023-02-20 LAB — GLUCOSE, CAPILLARY: Glucose-Capillary: 157 mg/dL — ABNORMAL HIGH (ref 70–99)

## 2023-02-20 NOTE — Telephone Encounter (Signed)
Daughter calls and says they are at pharmacy and Katherine Garza cost is 155 dollars and she cannot afford that. I quickly looked for a savings card but could not find one. I'll ask doctor and pharmacy tech for assistance. Daughter agreed to wait to hear back from our office.

## 2023-02-20 NOTE — Patient Instructions (Addendum)
Please record the time, amount and what food drinks and activities you have while wearing the continuous glucose monitor (CGM).  Bring the folder with you to follow up appointments. If your monitor falls off, please place it in the bag provided in your folder and bring it back with you to your next appointment.   Do not have a CT or an MRI while wearing the CGM.   1 week visit has been set up with me and a doctor for the first of two CGM downloads.   You will also return in 2 weeks to have your second download and the CGM removed.  Katherine Garza [phone]   Please pick up Victoza and start it. It is at  [place] Phone: [phone]  Fax: [phone]     Pick up your VICTOZA medicine and start back taking the 1.8 mg.   You will take both Victoza and the lantus insulin each day and can inject them at the same time at least 2 inches apart  Pick up your [facility] medicine and start back taking the 1.8 mg.   You will take both Victoza and the lantus insulin each day and can inject them at the same time at least 2 inches apart [place] dawa yako ya VICTOZA na uanze kumeza miligramu 1.8.   [place] dawa yako ya VICTOZA na uanze kumeza miligramu 1.8.   [place] Victoza na insulini ya lantus kila siku na unaweza kuzidunga kwa wakati mmoja angalau inchi 2 kutoka kwa kila mmoja.   [place] Victoza na insulini ya lantus kila siku na unaweza [place] kwa wakati mmoja angalau inchi 2 kutoka kwa kila mmoja.  Tafadhali rekodi [place], [place] na vinywaji na shughuli za chakula ulizo nazo ukiwa umevaa kifuatilia glukosi endelevu (CGM).  Leta folda nawe ili kufuatilia miadi. Kichunguzi chako [place], tafadhali kiweke kwenye mfuko uliotolewa kwenye folda yako na urudi nacho kwenye miadi yako ijayo.  Usiwe na CT au MRI wakati unavaa CGM.  Ziara ya wiki 1 imeanzishwa nami na daktari kwa mara ya kwanza kati ya vipakuliwa viwili vya CGM.  Pia utarudi baada ya wiki 2 ili  upakuaji wako wa pili na CGM kuondolewa.  Katherine Garza [phone]

## 2023-02-20 NOTE — Progress Notes (Signed)
Diabetes Self-Management Education  Visit Type:  Follow-up (fu# 1 from 05/2020)  Appt. Start Time: 925 Appt. End Time: 1025 15 minutes of this was professional CGM. 45 minutes was Diabetes Self Management Education & Support  Katherine Garza, identified by name and date of birth, is a 52 y.o. female with a diagnosis of Diabetes:  Katherine Garza  Type 2 diabetes. She completed a waiver of right to free interpreter services and elected to have her family friend translate.   ASSESSMENT  Per Dr. Dennie Maizes orders: Documentation for [facility] Continuous glucose monitoring Freestyle Libre Pro CGM sensor placed today. Patient was educated about wearing sensor, keeping food, activity and medication log and when to call office. Patient was educated about how to care for the sensor and not to have an MRI, CT or Diathermy while wearing the sensor. Follow up was arranged with the patient for 1 week with myself and Dr. Lisabeth Devoid. .   Lot #[id number] Serial #: [id number] Expiration Date: 08/11/23  Debera Lat, RD 02/20/2023 10:48 AM.   Lab Results  Component Value Date   HGBA1C 10.3 (A) 02/16/2023   HGBA1C 13.4 11/15/2022   HGBA1C 10.1 (A) 05/15/2022   HGBA1C 10.4 (A) 11/25/2021   HGBA1C 8.2 (A) 07/05/2021    Estimated body mass index is 38.32 kg/m as calculated from the following:   Height as of 02/16/23: '5\' 3"'$  (1.6 m).   Weight as of 02/16/23: 216 lb 4.8 oz (98.1 kg).  Wt Readings from Last 20 Encounters:  02/16/23 216 lb 4.8 oz (98.1 kg)  12/19/22 217 lb 1.6 oz (98.5 kg)  11/15/22 215 lb 1.6 oz (97.6 kg)  06/12/22 219 lb (99.3 kg)  05/22/22 218 lb (98.9 kg)  05/15/22 218 lb 9.6 oz (99.2 kg)  12/19/21 214 lb 9 oz (97.3 kg)  11/25/21 215 lb 9.6 oz (97.8 kg)  11/01/21 216 lb 4.3 oz (98.1 kg)  07/05/21 216 lb 3.2 oz (98.1 kg)  03/08/21 200 lb (90.7 kg)  12/06/20 209 lb (94.8 kg)  06/07/20 202 lb 8 oz (91.9 kg)  05/31/20 203 lb 14.4 oz (92.5 kg)  11/27/19 214 lb 4.8 oz (97.2 kg)   02/04/19 204 lb 1.6 oz (92.6 kg)  07/22/18 216 lb 4.8 oz (98.1 kg)  07/08/18 218 lb 9.6 oz (99.2 kg)  04/08/18 217 lb 8 oz (98.7 kg)  03/02/17 212 lb 11.2 oz (96.5 kg)   Patient states she ran out of victoza and was not sure she was supposed to continue it so stopped it sometime in February. She also states she was taking "18" units daily as she was unclear how much she was supposed to take. She denies any side effects or low blood sugar symptoms or values < 70. She states during this time she saw blood sugar values of 85-100 mg/dL. I spoke to Dr. Evette Doffing who said she should be instructed to pick up and restart her victoza at her previously tolerated dose of 1.8 mg daily.   She asked about help to learn to read and write so she could obtain citizenship in the [place]. I told her that I will ask our social worker for information on this. .     Diabetes Self-Management Education - 02/20/23 1000       Health Coping   How would you rate your overall health? Good      Psychosocial Assessment   Patient Belief/Attitude about Diabetes Motivated to manage diabetes    What is the hardest part  about your diabetes right now, causing you the most concern, or is the most worrisome to you about your diabetes?   Other (comment)   SHE DOES NOT READ OR WRITE ENGLISH AND POSSIBLY AT ALL   Self-care barriers English as a second language    Self-management support Friends;Family    Patient Concerns Monitoring;Medication    Special Needs Instruct caregiver;Simplified materials    Preferred Learning Style Auditory;Visual;Hands on    Learning Readiness Ready      Pre-Education Assessment   Patient understands the diabetes disease and treatment process. --   need to assess at future visit   Patient understands incorporating nutritional management into lifestyle. --   need to assess at future visit   Patient undertands incorporating physical activity into lifestyle. --   need to assess at future visit   Patient  understands using medications safely. Needs Review    Patient understands monitoring blood glucose, interpreting and using results Needs Review    Patient understands prevention, detection, and treatment of acute complications. Needs Review    Patient understands prevention, detection, and treatment of chronic complications. --   need to assess at future visit   Patient understands how to develop strategies to address psychosocial issues. Demonstrates understanding / competency    Patient understands how to develop strategies to promote health/change behavior. Demonstrates understanding / competency      Complications   Last HgB A1C per patient/outside source 10.4 %    How often do you check your blood sugar? 1-2 times/day    Fasting Blood glucose range (mg/dL) 130-179;70-129    Postprandial Blood glucose range (mg/dL) --   unknown   Number of hypoglycemic episodes per month 0    Number of hyperglycemic episodes ( >'200mg'$ /dL): --   unknown   Have you had a dilated eye exam in the past 12 months? --   ordered   Have you had a dental exam in the past 12 months? No    Are you checking your feet? No      Dietary Intake   Breakfast need to assess at future visit      Activity / Exercise   Activity / Exercise Type --   need to assess at future visit     Patient Education   Medications Reviewed patients medication for diabetes, action, purpose, timing of dose and side effects.    Monitoring Taught/evaluated CGM (comment)    Acute complications Taught prevention, symptoms, and  treatment of hypoglycemia - the 15 rule.      Individualized Goals (developed by patient)   Monitoring  Consistenly use CGM;Other (comment)   bring meter to office next visit     Outcomes   Program Status Re-entered      Subsequent Visit   Since your last visit have you continued or begun to take your medications as prescribed? No   takes lantus daily without fail, but did not know she was to continue to take  victoza   Since your last visit have you had your blood pressure checked? Yes    Is your most recent blood pressure lower, unchanged, or higher since your last visit? Lower    Since your last visit have you experienced any weight changes? Gain    Weight Gain (lbs) 13    Since your last visit, are you checking your blood glucose at least once a day? Yes   HAS NOT CHECJED BLOOD SUGAR TODAY BUT STATES WHEN SHE WAS ON vICTOZA HER  BLOOD SUGAR WAS 85-100 AND NO SIGNS OR SYMPTOMS OF LOW BLOOD SUGAR            Learning Objective:  Patient will have a greater understanding of diabetes self-management. Patient education plan is to attend individual and/or group sessions per assessed needs and concerns.   Plan:   Patient Instructions  Please record the time, amount and what food drinks and activities you have while wearing the continuous glucose monitor (CGM).  Bring the folder with you to follow up appointments. If your monitor falls off, please place it in the bag provided in your folder and bring it back with you to your next appointment.   Do not have a CT or an MRI while wearing the CGM.   1 week visit has been set up with me and a doctor for the first of two CGM downloads.   You will also return in 2 weeks to have your second download and the CGM removed.  Butch Penny [phone]   Please pick up Victoza and start it. It is at  [place] Phone: [phone]  Fax: [phone]     Pick up your VICTOZA medicine and start back taking the 1.8 mg.   You will take both Victoza and the lantus insulin each day and can inject them at the same time at least 2 inches apart  Pick up your [facility] medicine and start back taking the 1.8 mg.   You will take both Victoza and the lantus insulin each day and can inject them at the same time at least 2 inches apart [place] d[place] yako ya VICTOZA na uanze kumeza miligramu 1.8.   [place] yako ya VICTOZA na uanze  kumeza miligramu 1.8.   [place] Victoza na insulini ya lantus kila siku na unaweza kuzidunga kwa wakati mmoja angalau inchi 2 kutoka kwa kila mmoja.   [place] Victoza na insulini ya lantus kila siku na unaweza kuzidunga kwa wakati mmoja angalau inchi 2 kutoka kwa kila mmoja.  Tafadhali rekodi [place], [place] na vinywaji na shughuli za chakula ulizo nazo ukiwa umevaa kifuatilia glukosi endelevu (CGM).  Leta folda nawe ili kufuatilia miadi. Kichunguzi chako [place], tafadhali kiweke kwenye mfuko uliotolewa kwenye folda yako na urudi nacho kwenye miadi yako ijayo.  Usiwe na CT au MRI wakati unavaa CGM.  Ziara ya wiki 1 imeanzishwa nami na daktari kwa mara ya kwanza kati ya vipakuliwa viwili vya CGM.  Pia utarudi baada ya wiki 2 ili upakuaji wako wa pili na CGM kuondolewa.  Butch Penny [phone]  The above translation was approved by patient's interpreter.   Expected Outcomes:  Demonstrated interest in learning but significant barriers to change  Education material provided: Diabetes Resources  If problems or questions, patient to contact team via:  Phone  Future DSME appointment: - Other (comment) (1 week for cgm download) Debera Lat, RD 02/20/2023 10:49 AM.

## 2023-02-21 ENCOUNTER — Other Ambulatory Visit (HOSPITAL_COMMUNITY): Payer: Self-pay

## 2023-02-21 NOTE — Telephone Encounter (Signed)
I spole with Anette Riedel, pharmacy Tech who says victoza is covered by her plan, there is not a svaings card that she is awer of and this patiebnt will not quaklify for patient assistabncwe because she has Pharmacist, community

## 2023-02-22 ENCOUNTER — Other Ambulatory Visit (HOSPITAL_COMMUNITY): Payer: Self-pay

## 2023-02-22 NOTE — Progress Notes (Signed)
Internal Medicine Clinic Attending  Case discussed with Dr. Rogers  at the time of the visit.  We reviewed the resident's history and exam and pertinent patient test results.  I agree with the assessment, diagnosis, and plan of care documented in the resident's note.  

## 2023-02-22 NOTE — Telephone Encounter (Signed)
If Victoza is covered I am not sure that an alternate GLP-1 would be any more affordable. Was the pharmacy able to give any insight on this. I can write for ozempic or trulicity if that were the case.

## 2023-02-22 NOTE — Telephone Encounter (Signed)
Thanks for checking. Unfortunately doesn't seem like there is something more affordable to the patient. We can adjust her medications as needed at her next visit.

## 2023-02-26 ENCOUNTER — Other Ambulatory Visit: Payer: Self-pay

## 2023-02-26 ENCOUNTER — Other Ambulatory Visit (HOSPITAL_COMMUNITY): Payer: Self-pay

## 2023-02-26 DIAGNOSIS — E1165 Type 2 diabetes mellitus with hyperglycemia: Secondary | ICD-10-CM

## 2023-02-26 MED ORDER — VICTOZA 18 MG/3ML ~~LOC~~ SOPN
0.6000 mg | PEN_INJECTOR | Freq: Every day | SUBCUTANEOUS | 2 refills | Status: DC
  Filled 2023-02-26: qty 6, 60d supply, fill #0

## 2023-02-28 ENCOUNTER — Other Ambulatory Visit (HOSPITAL_COMMUNITY): Payer: Self-pay

## 2023-02-28 ENCOUNTER — Telehealth: Payer: Self-pay | Admitting: *Deleted

## 2023-02-28 ENCOUNTER — Ambulatory Visit: Payer: 59 | Admitting: Student

## 2023-02-28 ENCOUNTER — Encounter: Payer: Self-pay | Admitting: Student

## 2023-02-28 ENCOUNTER — Encounter: Payer: Self-pay | Admitting: Dietician

## 2023-02-28 ENCOUNTER — Ambulatory Visit (INDEPENDENT_AMBULATORY_CARE_PROVIDER_SITE_OTHER): Payer: 59 | Admitting: Dietician

## 2023-02-28 VITALS — Wt 220.6 lb

## 2023-02-28 VITALS — BP 125/74 | HR 72 | Ht 63.0 in | Wt 220.6 lb

## 2023-02-28 DIAGNOSIS — Z794 Long term (current) use of insulin: Secondary | ICD-10-CM

## 2023-02-28 DIAGNOSIS — E669 Obesity, unspecified: Secondary | ICD-10-CM

## 2023-02-28 DIAGNOSIS — E1165 Type 2 diabetes mellitus with hyperglycemia: Secondary | ICD-10-CM

## 2023-02-28 DIAGNOSIS — Z6835 Body mass index (BMI) 35.0-35.9, adult: Secondary | ICD-10-CM | POA: Diagnosis not present

## 2023-02-28 DIAGNOSIS — K7460 Unspecified cirrhosis of liver: Secondary | ICD-10-CM | POA: Diagnosis not present

## 2023-02-28 DIAGNOSIS — Z7984 Long term (current) use of oral hypoglycemic drugs: Secondary | ICD-10-CM

## 2023-02-28 MED ORDER — NOVOLOG FLEXPEN 100 UNIT/ML ~~LOC~~ SOPN
8.0000 [IU] | PEN_INJECTOR | Freq: Two times a day (BID) | SUBCUTANEOUS | 11 refills | Status: DC
  Filled 2023-02-28: qty 6, 30d supply, fill #0

## 2023-02-28 MED ORDER — VICTOZA 18 MG/3ML ~~LOC~~ SOPN
1.8000 mg | PEN_INJECTOR | Freq: Every day | SUBCUTANEOUS | 2 refills | Status: DC
  Filled 2023-02-28: qty 6, 20d supply, fill #0
  Filled 2023-02-28: qty 9, 30d supply, fill #0

## 2023-02-28 NOTE — Progress Notes (Signed)
CC: Diabetes follow-up  HPI:  [name] is a 52 y.o. [place] female presents to clinic accompanied by Katherine Garza Garza and daughter for follow-up on her diabetes. Please see problem based charting for evaluation, assessment and plan.  Encounter completed via interpretation by Katherine Garza as well as daughter  Past Medical History:  Diagnosis Date   Diabetes mellitus without complication ([place])    Hepatic cirrhosis ([place]) 11/27/2014   Compensated, follows with GI and hepatology   Hepatitis C virus infection 01/04/2015   History of malaria    Multiple episodes w/ 2 hospitalizations   Hypertension    Pancytopenia ([place])    Secondary to cirrhosis   Splenomegaly 11/27/2014   Secondary to cirrhosis   Wound of left foot 11/27/2019    Review of Systems:  Constitutional: Negative for fever, polydipsia or fatigue Eyes: Negative for visual changes Respiratory: Negative for shortness of breath Cardiac: Negative for chest pain Extremities: Positive for chronic leg swelling Abdomen: Negative for abdominal pain, constipation or diarrhea Neuro: Negative for headache or weakness  Physical Exam: General: Pleasant, well-appearing middle-aged [place] woman. No acute distress. Cardiac: RRR. No murmurs, rubs or gallops. Trace BLE nonpitting edema Respiratory: Lungs CTAB. No wheezing or crackles. Abdominal: Soft, symmetric and non tender. Normal BS. Skin: Warm, dry and intact. Chronic hyperpigmentation of the lower extremities. Extremities: Atraumatic. Full ROM. Palpable radial and DP pulses. Neuro: A&O x 3. Moves all extremities.  Normal sensation to gross touch Psych: Appropriate mood and affect.  Vitals:   02/28/23 1033  BP: 125/74  Pulse: 72  SpO2: 100%  Weight: 220 lb 9.6 oz (100.1 kg)  Height: 5\' 3"  (1.6 m)    Assessment & Plan:   Type 2 diabetes mellitus ([place]) Patient presents with her daughter and a Swahili Garza for follow-up of her diabetes.  CGM was placed 1 week ago. Patient ran out of her Lantus and based on her readings, she had a few lows on the 2 days she took the Lantus. The rest of her readings showed normal-low fasting sugars and high sugars after her 2 big meals each day. Patient's diet is heavy in starches and carbs. She and her daughter were counseled by Butch Penny and myself to decrease her intake of carbs, sugary and starchy foods and try to be more active. Patient's readings the last 5 days long-acting insulin has been relatively stable and she remains at risk of fasting hypoglycemia due to her cirrhosis. I will discontinue Lantus and initiate short acting insulin to help address her postprandial highs. Katherine Garza wore the CGM for 9 days. The average reading was 152, % time in target was 68, % time below target was 3, and % time above target was 29. Intervention will be to continue Victoza and start mealtime coverage to address postprandial highs. The patient will be scheduled to see Butch Penny for a final appointment.   Plan: -Continue CGM for another week -Discontinue Lantus and start NovoLog 8 units twice daily before meals. -Continue Victoza 1.8 mg daily -Continue lifestyle occasions with dietary changes and exercise -Follow-up in 1 week for re-evaluation  Hepatic cirrhosis ([place]) Currently [facility] [place] [facility]. She has some chronic hyperpigmentation of the lower extremities with mild generalized edema of her legs but she denies any other symptoms. She is adherent to her medications. Blood pressure stable with SBP in the 120s.  Plan: -Follow-up with hepatologist as needed -Continue nadolol 40 mg daily and Lasix 40 mg daily  Obesity, Class  II, BMI 35-39.9 Patient's weight has been relatively stable between 214-220 pounds in the last year. Weight is up 4 pounds since her last office visit 1 week ago. Her diet is heavy in starchy foods and carbs. I counseled patient and her daughter  concerning the need to decrease her intake of fatty foods, sugary foods and foods high in carbs and increase her intake of vegetables, fruits, whole grains and fish. Patient also counseled on being more active during the day. -Continue lifestyle modifications with dietary changes and exercise -Continue Victoza    See Encounters Tab for problem based charting.  Patient discussed with Dr. Lockie Pares, MD, MPH

## 2023-02-28 NOTE — Telephone Encounter (Signed)
Patient's daughter called in stating they p/u Victoza and Novolog at pharmacy and want to know if she is to take both these meds. States Victoza was only $10 which they can afford. It was Lantus that they could not afford. Discussed with Provider and he will call daughter back.

## 2023-02-28 NOTE — Telephone Encounter (Signed)
Thanks Laurenze. I spoke with her daughter and told her to take both. She will go pick up the Novolog tomorrow. [place] desk, can you change her appt to a 1-week follow up so Butch Penny can download the CGM and we can re-evaluate her regimen? Thanks.

## 2023-02-28 NOTE — Progress Notes (Unsigned)
Diabetes Self-Management Education  Visit Type:  Follow-up  Appt. Start Time: 905 Appt. End Time: 51   Katherine Garza, identified by name and date of birth, is a 52 y.o. female with a diagnosis of Diabetes:  [name] Type 2  ASSESSMENT  CGM downloaded x 8 days. It was reviewed with patient, daughter using interpreter and Dr. Coy Saunas.  Average x 8 days 152, 5 very mild hypoglycemic events (patient without symptoms), 68% in target, 29% higher than 180, 3% of time hypoglycemic  Weight 220 lb 9.6 oz (100.1 kg). Body mass index is 39.08 kg/m.    Diabetes Self-Management Education - 02/28/23 0900       Health Coping   How would you rate your overall health? Good      Pre-Education Assessment   Patient understands the diabetes disease and treatment process. Needs Instruction    Patient understands incorporating nutritional management into lifestyle. Needs Instruction    Patient undertands incorporating physical activity into lifestyle. Needs Instruction    Patient understands prevention, detection, and treatment of chronic complications. Needs Review      Complications   Postprandial Blood glucose range (mg/dL) 180-200;>200    Number of hyperglycemic episodes ( >200mg /dL): Weekly    Can you tell when your blood sugar is high? No      Dietary Intake   Breakfast does not eat in am because she is sleeping. 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    Medications Reviewed patients medication for diabetes, action,  purpose, timing of dose and side effects.    Monitoring Taught/evaluated CGM (comment)    Acute complications Discussed and identified patients' prevention, symptoms, and treatment of hyperglycemia.    Preconception care Other (comment)   NA     Individualized Goals (developed by patient)   Medications take my medication as prescribed    Monitoring  Consistenly use CGM      Patient Self-Evaluation of Goals - Patient rates self as meeting previously set goals (% of time)   Medications 25 - 50% (sometimes)    Monitoring >75% (most of the time)      Outcomes   Program Status Not Completed      Subsequent Visit   Since your last visit have you continued or begun to take your medications as prescribed? No   There was/is much confusion beween victoza and lantus insulin. she also confused the doses. ran out of lantus after taking it first two days of CGM. she only has 1/3 victoza pen and cannot afford refills. discussed this with doctor   Since your last visit have you had your blood pressure checked? Yes    Is your most recent blood pressure lower, unchanged, or higher since your last visit? Unchanged   and in target   Since your last visit have you experienced any weight changes? Gain    Weight Gain (lbs)  4    Since your last visit, are you checking your blood glucose at least once a day? Yes   she brought her meter, and reports some readings in the 70s. we did not look at her meter today            Learning Objective:  Patient will have a greater understanding of diabetes self-management. Patient education plan is to attend individual and/or group sessions per assessed needs and concerns.   Plan:   There are no Patient Instructions on file for this visit.   Expected Outcomes:  Demonstrated interest in learning but significant barriers to change  Education material provided: Diabetes Resources  If problems or questions, patient to contact team via:  Phone  Future DSME  appointment: - Other (comment) (1 week for CGM download and removal and 1 month for dsmes follow up) Debera Lat, RD 02/28/2023 12:01 PM.

## 2023-02-28 NOTE — Patient Instructions (Addendum)
Thank you, [name] for allowing [place] to provide your care today. Today, we discussed your diabetes.    Since you are unable to afford the Victoza, I am starting you on mealtime insulin to help improve your blood sugars after your meals.  I have ordered the following medication/changed the following medications:  Start NovoLog 8 units twice daily with meals Discontinue Lantus  My Chart Access: [email or web address]  Please follow-up in 2 weeks  Please make sure to arrive 15 minutes prior to your next appointment. If you arrive late, you may be asked to reschedule.    We look forward to seeing you next time. Please call our clinic at [phone] if you have any questions or concerns. The best time to call is Monday-Friday from 9am-4pm, but there is someone available 24/7. If after hours or the weekend, call the main hospital number and ask for the Internal Medicine Resident On-Call. If you need medication refills, please notify your pharmacy one week in advance and they will send [place] a request.   Thank you for letting us take part in your care. Wishing you the best!  Lacinda Axon, MD 02/28/2023, 11:16 AM IM Resident, PGY-3 Katherine Garza 41:10

## 2023-03-01 ENCOUNTER — Encounter: Payer: Self-pay | Admitting: Student

## 2023-03-01 DIAGNOSIS — E669 Obesity, unspecified: Secondary | ICD-10-CM | POA: Insufficient documentation

## 2023-03-01 NOTE — Assessment & Plan Note (Addendum)
Patient's weight has been relatively stable between 214-220 pounds in the last year. Weight is up 4 pounds since her last office visit 1 week ago. Her diet is heavy in starchy foods and carbs. I counseled patient and her daughter concerning the need to decrease her intake of fatty foods, sugary foods and foods high in carbs and increase her intake of vegetables, fruits, whole grains and fish. Patient also counseled on being more active during the day. -Continue lifestyle modifications with dietary changes and exercise -Continue Victoza

## 2023-03-01 NOTE — Assessment & Plan Note (Addendum)
Patient presents with her daughter and a Swah[place] interpreter for follow-up of her diabetes. CGM was placed 1 week ago. Patient ran out of her Lantus and based on her readings, she had a few lows on the 2 days she took the Lantus. The rest of her readings showed normal-low fasting sugars and high sugars after her 2 big meals each day. Patient's diet is heavy in starches and carbs. She and her daughter were counseled by Butch Penny and myself to decrease her intake of carbs, sugary and starchy foods and try to be more active. Patient's readings the last 5 days long-acting insulin has been relatively stable and she remains at risk of fasting hypoglycemia due to her cirrhosis. I will discontinue Lantus and initiate short acting insulin to help address her postprandial highs. Katherine Garza wore the CGM for 9 days. The average reading was 152, % time in target was 68, % time below target was 3, and % time above target was 29. Intervention will be to continue Victoza and start mealtime coverage to address postprandial highs. The patient will be scheduled to see Butch Penny for a final appointment.   Plan: -Continue CGM for another week -Discontinue Lantus and start NovoLog 8 units twice daily before meals. -Continue Victoza 1.8 mg daily -Continue lifestyle occasions with dietary changes and exercise -Follow-up in 1 week for re-evaluation

## 2023-03-01 NOTE — Assessment & Plan Note (Signed)
Currently undergoing [place] [facility] She has some chronic hyperpigmentation of the lower extremities with mild generalized edema of her legs but she denies any other symptoms. She is adherent to her medications. Blood pressure stable with SBP in the 120s.  Plan: -Follow-up with hepatologist as needed -Continue nadolol 40 mg daily and Lasix 40 mg daily

## 2023-03-01 NOTE — Patient Instructions (Addendum)
Tafadhali fuatilia baada ya wiki 1 ili upakue na uondoe kihisi chako cha Kufuatilia glukosi.  Sukari ya damu yako inalengwa zaidi, lakini baada ya milo hupita juu ya lengo.  Unakula vyakula vyenye afya kwa ujumla na hii inasaidia. Olam Idler hivyo, inaweza [phone] kula mboga zaidi na protini [place] na sehemu ndogo za wanga kama viazi vikuu, viazi, mihogo, na ndizi [employer].  [place]ante kwa [place] ulaji wako wa chakula na kuvaa kitambuzi. Laurence Spates mita [employer].  Piga simu wakati wowote!  Butch Penny [phone]    Please make a follow up in 1 week to download and remove your Continuous glucose monitoring sensor.  Your blood sugars are mostly in target, but after meals are going above target.  You are eating healthy foods overall and this is helping. However, it would help for you to eat more vegetables and lean protein and smaller portions of starch like yams, potatoes, cassava, and plantains at your meals.  Thank you for writing down your food intake and wearing the sensor. We can look at your meter next week.   Call anytime!  Butch Penny [phone]

## 2023-03-02 NOTE — Progress Notes (Signed)
Internal Medicine Clinic Attending  Case discussed with Dr. Amponsah  At the time of the visit.  We reviewed the resident's history and exam and pertinent patient test results.  I agree with the assessment, diagnosis, and plan of care documented in the resident's note.  

## 2023-03-07 ENCOUNTER — Telehealth: Payer: Self-pay | Admitting: Dietician

## 2023-03-07 ENCOUNTER — Encounter: Payer: Self-pay | Admitting: Dietician

## 2023-03-07 NOTE — Telephone Encounter (Addendum)
Tried calling patient using pacific interpreter # [phone] due to missed appointment today and offer her an alternative appointment to have [facility] sensor removed.. there was no answer and no option to leave a message.

## 2023-03-14 ENCOUNTER — Other Ambulatory Visit (HOSPITAL_COMMUNITY): Payer: Self-pay

## 2023-03-14 ENCOUNTER — Ambulatory Visit: Payer: 59 | Admitting: Student

## 2023-03-14 VITALS — BP 122/67 | HR 65 | Temp 97.5°F | Ht 63.0 in | Wt 221.8 lb

## 2023-03-14 DIAGNOSIS — K7460 Unspecified cirrhosis of liver: Secondary | ICD-10-CM | POA: Diagnosis not present

## 2023-03-14 DIAGNOSIS — E1165 Type 2 diabetes mellitus with hyperglycemia: Secondary | ICD-10-CM

## 2023-03-14 DIAGNOSIS — R600 Localized edema: Secondary | ICD-10-CM

## 2023-03-14 DIAGNOSIS — Z794 Long term (current) use of insulin: Secondary | ICD-10-CM

## 2023-03-14 DIAGNOSIS — Z7985 Long-term (current) use of injectable non-insulin antidiabetic drugs: Secondary | ICD-10-CM | POA: Diagnosis not present

## 2023-03-14 DIAGNOSIS — K259 Gastric ulcer, unspecified as acute or chronic, without hemorrhage or perforation: Secondary | ICD-10-CM | POA: Diagnosis not present

## 2023-03-14 MED ORDER — VICTOZA 18 MG/3ML ~~LOC~~ SOPN
1.8000 mg | PEN_INJECTOR | Freq: Every day | SUBCUTANEOUS | 2 refills | Status: DC
Start: 2023-03-14 — End: 2023-03-14
  Filled 2023-03-14: qty 9, 30d supply, fill #0

## 2023-03-14 MED ORDER — NADOLOL 40 MG PO TABS
40.0000 mg | ORAL_TABLET | Freq: Every day | ORAL | 1 refills | Status: DC
Start: 2023-03-14 — End: 2023-11-05
  Filled 2023-03-14: qty 30, 30d supply, fill #0
  Filled 2023-05-04: qty 30, 30d supply, fill #1
  Filled 2023-06-04: qty 30, 30d supply, fill #2
  Filled 2023-07-02: qty 30, 30d supply, fill #3
  Filled 2023-08-07: qty 30, 30d supply, fill #4
  Filled 2023-09-11: qty 30, 30d supply, fill #5

## 2023-03-14 MED ORDER — NOVOLOG FLEXPEN 100 UNIT/ML ~~LOC~~ SOPN: 10.0000 [IU] | PEN_INJECTOR | Freq: Two times a day (BID) | SUBCUTANEOUS | 11 refills | Status: DC

## 2023-03-14 MED ORDER — FUROSEMIDE 40 MG PO TABS
40.0000 mg | ORAL_TABLET | Freq: Every day | ORAL | 1 refills | Status: DC
Start: 2023-03-14 — End: 2023-11-05
  Filled 2023-03-14: qty 30, 30d supply, fill #0
  Filled 2023-05-04: qty 30, 30d supply, fill #1
  Filled 2023-06-04: qty 30, 30d supply, fill #2
  Filled 2023-07-02: qty 30, 30d supply, fill #3
  Filled 2023-08-07: qty 30, 30d supply, fill #4
  Filled 2023-09-11: qty 30, 30d supply, fill #5

## 2023-03-14 MED ORDER — PANTOPRAZOLE SODIUM 40 MG PO TBEC
40.0000 mg | DELAYED_RELEASE_TABLET | Freq: Every day | ORAL | 3 refills | Status: DC
Start: 2023-03-14 — End: 2024-03-03
  Filled 2023-03-14: qty 30, 30d supply, fill #0
  Filled 2023-05-04: qty 30, 30d supply, fill #1
  Filled 2023-06-04: qty 30, 30d supply, fill #2
  Filled 2023-07-02: qty 30, 30d supply, fill #3
  Filled 2023-08-07: qty 30, 30d supply, fill #4
  Filled 2023-09-11: qty 30, 30d supply, fill #5
  Filled 2023-10-09: qty 30, 30d supply, fill #6
  Filled 2023-10-10: qty 90, 90d supply, fill #0

## 2023-03-14 MED ORDER — VICTOZA 18 MG/3ML ~~LOC~~ SOPN
1.8000 mg | PEN_INJECTOR | Freq: Every day | SUBCUTANEOUS | 2 refills | Status: DC
Start: 2023-03-14 — End: 2023-07-02
  Filled 2023-03-14: qty 9, 22d supply, fill #0
  Filled 2023-03-17: qty 9, 30d supply, fill #0
  Filled 2023-04-23: qty 9, 30d supply, fill #1
  Filled 2023-05-04 – 2023-05-28 (×2): qty 9, 30d supply, fill #2

## 2023-03-14 NOTE — Progress Notes (Signed)
Professional Continuous glucose monitor was downloaded with report given to Dr. Heide Scales and her sensor was removed.  Debera Lat, RD 03/14/2023 9:11 AM.

## 2023-03-14 NOTE — Patient Instructions (Addendum)
Thank you, Katherine Garza for allowing [place] to provide your care today. Today we discussed .    Diabetes We will increase your novolog to 10 units twice a day with meals. I will also increase your victoza to 2.4mg . If you notice you are having frequent blood sugar readings below 80, please call us to be seen sooner.   Cirrhosis Please follow up with your liver doctors this month. The number to their office is [phone]. If you notice any bright red or dark stools please call their office immediately. They also scheduled you for an ultrasound of your liver which does not seem to have been done.   Asante, [name] kwa kuturuhusu [employer]. Leo tulijadili.  [place] wa kisukari Tutaongeza novolog yako hadi vitengo 10 mara mbili kwa siku na milo. Pia nitaongeza victoza yako hadi 2.4mg . [name] Orion Modest [name] viwango vya sukari ya damu mara kwa mara chini ya 34, tafadhali tupigie simu ili tuonekane [name].  [place] wa Cirrhosis Tafadhali fuatana na madaktari wako wa ini mwezi huu. [name] ya ofisi yao ni [phone]. Ukiona kinyesi chenye rangi nyekundu au cheusi, tafadhali piga simu ofisini kwao mara moja. Pia walikupangia uchunguzi wa ultrasound wa ini lako ambao hauonekani kuwa umefanywa.   I have ordered the following labs for you:  Lab Orders  No laboratory test(s) ordered today      Referrals ordered today:   Referral Orders  No referral(s) requested today    Follow up: 1 month follow up for diabetes   Should you have any questions or concerns please call the internal medicine clinic at [phone].    [name], D.O. [place]

## 2023-03-15 NOTE — Assessment & Plan Note (Addendum)
Following with hepatologist at [place] and is supposed to have follow up this week. Gave her the number to schedule the appointment as unable to see if this is already done in our system. Also discussed need for every 6 month RUQ [place] screening, she missed prior appointment and will need to reschedule.  She has been doing well with her nadolol and Lasix and has not noticed any abdominal distention or pain.  She denies any bloody vomitus or stools.

## 2023-03-15 NOTE — Progress Notes (Signed)
   CC: Follow up diabetes  HPI:  [name] is a 52 y.o. female living with a history stated below and presents today for her diabetes. Please see problem based assessment and plan for additional details.  Her daughter assisted in interpreting for her.  Her primary language is Swahili.  Past Medical History:  Diagnosis Date   Diabetes mellitus without complication ([place])    Hepatic cirrhosis ([place]) 11/27/2014   Compensated, follows with GI and hepatology   Hepatitis C virus infection 01/04/2015   History of malaria    Multiple episodes w/ 2 hospitalizations   Hypertension    Pancytopenia ([place])    Secondary to cirrhosis   Splenomegaly 11/27/2014   Secondary to cirrhosis   Wound of left foot 11/27/2019    Current Outpatient Medications on File Prior to Visit  Medication Sig Dispense Refill   Insulin Pen Needle (UNIFINE PENTIPS) 32G X 4 MM MISC Use to inject insulin once daily 100 each 2   No current facility-administered medications on file prior to visit.    Review of Systems: ROS negative except for what is noted on the assessment and plan.  Vitals:   03/14/23 0858  BP: 122/67  Pulse: 65  Temp: (!) 97.5 F (36.4 C)  TempSrc: Oral  SpO2: 100%  Weight: 221 lb 12.8 oz (100.6 kg)  Height: 5\' 3"  (1.6 m)    Physical Exam: Constitutional: well-appearing, in no acute distress HENT: normocephalic atraumatic, mucous membranes moist Eyes: conjunctiva non-erythematous Neck: supple Cardiovascular: regular rate and rhythm, no m/r/g Pulmonary/Chest: normal work of breathing on room air, lungs clear to auscultation bilaterally Abdominal: soft, non-tender, non-distended.  No ascites or hepatomegaly MSK: normal bulk and tone Neurological: alert & oriented x 3 Skin: warm and dry Psych: Normal mood  Assessment & Plan:   Hepatic cirrhosis ([place]) Following with hepatologist at [place] and is supposed to have follow up this week. Gave her the number to schedule the  appointment as unable to see if this is already done in our system. Also discussed need for every 6 month RUQ [place] screening, she missed prior appointment and will need to reschedule.  She has been doing well with her nadolol and Lasix and has not noticed any abdominal distention or pain.  She denies any bloody vomitus or stools.  Type 2 diabetes mellitus ([place]) Follow-up today after changing insulin regimen last visit after having episodes of hypoglycemia.  She was transitioned from Lantus to NovoLog 8 units twice daily with meals.  Her largest meals are at lunch around 12 to 1:30 PM and dinner at around 8:30 PM.  Her professional CGM report shows that she is having persistently elevated glucose levels after lunch and into the late evening.  She is at target range from 12 AM to 9 AM.  With these readings we will increase her NovoLog to 10 units twice daily.  We also tried to increase her Victoza however this was not covered by her insurance.  Continue on Victoza 1.8 mg daily which she has been adherent to.  Will need repeat A1c in 2 months.  The patient is currently using Continuous Glucose Monitoring. The patient is injecting insulin to times a day. The patient is making adjustments to their diabetes regimen based on glucose readings.   Patient discussed with Dr. Butch Penny, D.O. [facility], PGY-3 Phone: [phone] Date 03/15/2023 Time 10:35 AM

## 2023-03-15 NOTE — Assessment & Plan Note (Addendum)
Follow-up today after changing insulin regimen last visit after having episodes of hypoglycemia.  She was transitioned from Lantus to NovoLog 8 units twice daily with meals.  Her largest meals are at lunch around 12 to 1:30 PM and dinner at around 8:30 PM.  Her professional CGM report shows that she is having persistently elevated glucose levels after lunch and into the late evening.  She is at target range from 12 AM to 9 AM.  With these readings we will increase her NovoLog to 10 units twice daily.  We also tried to increase her Victoza however this was not covered by her insurance.  Continue on Victoza 1.8 mg daily which she has been adherent to.  Will need repeat A1c in 2 months.  The patient is currently using Continuous Glucose Monitoring. The patient is injecting insulin to times a day. The patient is making adjustments to their diabetes regimen based on glucose readings.

## 2023-03-16 ENCOUNTER — Other Ambulatory Visit (HOSPITAL_COMMUNITY): Payer: Self-pay

## 2023-03-16 ENCOUNTER — Other Ambulatory Visit: Payer: Self-pay | Admitting: *Deleted

## 2023-03-16 MED ORDER — NOVOLOG FLEXPEN 100 UNIT/ML ~~LOC~~ SOPN
10.0000 [IU] | PEN_INJECTOR | Freq: Two times a day (BID) | SUBCUTANEOUS | 11 refills | Status: DC
  Filled 2023-03-16: qty 6, 30d supply, fill #0
  Filled 2023-05-04: qty 6, 30d supply, fill #1
  Filled 2023-06-04: qty 6, 30d supply, fill #2
  Filled 2023-07-02: qty 6, 30d supply, fill #3
  Filled 2023-08-07: qty 6, 30d supply, fill #4
  Filled 2023-09-11: qty 6, 30d supply, fill #5
  Filled 2023-10-09: qty 6, 30d supply, fill #6

## 2023-03-16 NOTE — Addendum Note (Signed)
Addended by: Dickie La on: 03/16/2023 10:17 AM   Modules accepted: Level of Service

## 2023-03-16 NOTE — Telephone Encounter (Signed)
Received call from patient that Novolog Flex Pen is not at pharmacy. No Print option selected on Rx written 4/3. Please resend as Normal. Thanks!

## 2023-03-16 NOTE — Progress Notes (Signed)
Internal Medicine Clinic Attending  Case discussed with Dr. Katsadouros  At the time of the visit.  We reviewed the resident's history and exam and pertinent patient test results.  I agree with the assessment, diagnosis, and plan of care documented in the resident's note.  

## 2023-03-19 ENCOUNTER — Other Ambulatory Visit (HOSPITAL_COMMUNITY): Payer: Self-pay

## 2023-04-06 ENCOUNTER — Other Ambulatory Visit: Payer: Self-pay | Admitting: Nurse Practitioner

## 2023-04-06 DIAGNOSIS — K7460 Unspecified cirrhosis of liver: Secondary | ICD-10-CM

## 2023-04-13 ENCOUNTER — Encounter: Payer: 59 | Admitting: Student

## 2023-04-18 ENCOUNTER — Ambulatory Visit
Admission: RE | Admit: 2023-04-18 | Discharge: 2023-04-18 | Disposition: A | Discharge status: Home / Self Care | Payer: 59 | Source: Ambulatory Visit | Attending: Nurse Practitioner | Admitting: Nurse Practitioner

## 2023-04-18 DIAGNOSIS — K7460 Unspecified cirrhosis of liver: Secondary | ICD-10-CM

## 2023-04-23 ENCOUNTER — Other Ambulatory Visit (HOSPITAL_COMMUNITY): Payer: Self-pay

## 2023-05-04 ENCOUNTER — Other Ambulatory Visit (HOSPITAL_COMMUNITY): Payer: Self-pay

## 2023-05-04 ENCOUNTER — Other Ambulatory Visit: Payer: Self-pay

## 2023-05-28 ENCOUNTER — Other Ambulatory Visit (HOSPITAL_COMMUNITY): Payer: Self-pay

## 2023-06-04 ENCOUNTER — Other Ambulatory Visit (HOSPITAL_COMMUNITY): Payer: Self-pay

## 2023-07-02 ENCOUNTER — Other Ambulatory Visit (HOSPITAL_COMMUNITY): Payer: Self-pay

## 2023-07-02 ENCOUNTER — Other Ambulatory Visit: Payer: Self-pay

## 2023-07-02 ENCOUNTER — Other Ambulatory Visit: Payer: Self-pay | Admitting: Student

## 2023-07-02 DIAGNOSIS — E1165 Type 2 diabetes mellitus with hyperglycemia: Secondary | ICD-10-CM

## 2023-07-02 MED ORDER — LIRAGLUTIDE 18 MG/3ML ~~LOC~~ SOPN
1.8000 mg | PEN_INJECTOR | Freq: Every day | SUBCUTANEOUS | 2 refills | Status: DC
Start: 2023-07-02 — End: 2023-10-09
  Filled 2023-07-02: qty 9, 30d supply, fill #0
  Filled 2023-08-07: qty 9, 30d supply, fill #1
  Filled 2023-09-11: qty 9, 30d supply, fill #2

## 2023-08-07 ENCOUNTER — Other Ambulatory Visit (HOSPITAL_COMMUNITY): Payer: Self-pay

## 2023-08-07 ENCOUNTER — Other Ambulatory Visit: Payer: Self-pay

## 2023-08-09 ENCOUNTER — Other Ambulatory Visit (HOSPITAL_COMMUNITY): Payer: Self-pay

## 2023-09-11 ENCOUNTER — Other Ambulatory Visit: Payer: Self-pay

## 2023-09-12 ENCOUNTER — Other Ambulatory Visit (HOSPITAL_COMMUNITY): Payer: Self-pay

## 2023-09-12 ENCOUNTER — Other Ambulatory Visit: Payer: Self-pay

## 2023-09-13 ENCOUNTER — Other Ambulatory Visit (HOSPITAL_COMMUNITY): Payer: Self-pay

## 2023-09-28 ENCOUNTER — Other Ambulatory Visit (HOSPITAL_COMMUNITY): Payer: Self-pay

## 2023-10-09 ENCOUNTER — Other Ambulatory Visit (HOSPITAL_COMMUNITY): Payer: Self-pay

## 2023-10-09 ENCOUNTER — Other Ambulatory Visit: Payer: Self-pay

## 2023-10-09 ENCOUNTER — Other Ambulatory Visit: Payer: Self-pay | Admitting: Internal Medicine

## 2023-10-09 DIAGNOSIS — E1165 Type 2 diabetes mellitus with hyperglycemia: Secondary | ICD-10-CM

## 2023-10-09 MED ORDER — LIRAGLUTIDE 18 MG/3ML ~~LOC~~ SOPN
1.8000 mg | PEN_INJECTOR | Freq: Every day | SUBCUTANEOUS | 2 refills | Status: DC
Start: 2023-10-09 — End: 2024-02-19
  Filled 2023-10-09 – 2023-10-10 (×3): qty 9, 30d supply, fill #0
  Filled 2023-11-15 – 2023-11-16 (×2): qty 9, 30d supply, fill #1
  Filled 2023-12-18: qty 9, 30d supply, fill #2

## 2023-10-10 ENCOUNTER — Other Ambulatory Visit: Payer: Self-pay

## 2023-10-10 ENCOUNTER — Other Ambulatory Visit (HOSPITAL_COMMUNITY): Payer: Self-pay

## 2023-10-12 ENCOUNTER — Other Ambulatory Visit: Payer: Self-pay

## 2023-10-12 ENCOUNTER — Other Ambulatory Visit (HOSPITAL_COMMUNITY): Payer: Self-pay

## 2023-10-15 ENCOUNTER — Telehealth: Payer: Self-pay

## 2023-10-15 NOTE — Telephone Encounter (Signed)
Submitted patient portion of novo nordisk application online 10/15/23.  Will place provider portion in Dr. Graciella Freer box for signature.

## 2023-10-15 NOTE — Telephone Encounter (Signed)
Victoza and Novolog are no longer available on the IM PROGRAM med list.   Will attempt assistance with Novo Nordisk for both medications.

## 2023-10-16 ENCOUNTER — Other Ambulatory Visit: Payer: Self-pay | Admitting: Internal Medicine

## 2023-10-16 ENCOUNTER — Other Ambulatory Visit: Payer: Self-pay

## 2023-10-16 DIAGNOSIS — E1165 Type 2 diabetes mellitus with hyperglycemia: Secondary | ICD-10-CM

## 2023-10-16 MED ORDER — INSULIN LISPRO (1 UNIT DIAL) 100 UNIT/ML (KWIKPEN)
10.0000 [IU] | PEN_INJECTOR | Freq: Two times a day (BID) | SUBCUTANEOUS | 3 refills | Status: DC
  Filled 2023-10-16: qty 6, 30d supply, fill #0
  Filled 2023-11-15: qty 6, 30d supply, fill #1
  Filled 2023-12-18 – 2024-04-13 (×2): qty 6, 30d supply, fill #2
  Filled 2024-05-19: qty 6, 30d supply, fill #3
  Filled 2024-09-24: qty 6, 30d supply, fill #4

## 2023-10-16 NOTE — Progress Notes (Signed)
Novolog no longer covered by IM program. Novo nordisk applications have been sent. Humalog sent to [facility] for charity fund. -humalog 10 units BID

## 2023-10-18 ENCOUNTER — Other Ambulatory Visit: Payer: Self-pay

## 2023-10-22 ENCOUNTER — Other Ambulatory Visit (HOSPITAL_COMMUNITY): Payer: Self-pay

## 2023-10-23 ENCOUNTER — Other Ambulatory Visit (HOSPITAL_COMMUNITY): Payer: Self-pay

## 2023-10-31 NOTE — Progress Notes (Deleted)
Pharmacy Medication Assistance Program Note    10/31/2023  Patient ID: Katherine Garza, female   DOB: [birth year], 52 y.o.   MRN: [id number]     10/15/2023 10/31/2023  Outreach Medication One  Initial Outreach Date (Medication One) 10/11/2023   Manufacturer Medication One [employer] Drugs Novolog   Type of Radiographer, therapeutic Assistance   Name of Prescriber GRACE LAU Dickie La  Date Application Received From Provider  10/29/2023  Date Application Submitted to Manufacturer 10/15/2023 10/31/2023  Method Application Sent to Manufacturer Online Fax           10/15/2023 10/31/2023  Outreach Medication Two  Initial Outreach Date (Medication Two) 10/11/2023   Manufacturer Medication Two Novo Nordisk   Nordisk Drugs Victoza   Dose of Victoza 1.8MG    Type of Sport and exercise psychologist   Name of Prescriber GRACE LAU Dickie La  Date Application Received From Provider  10/29/2023  Method Application Sent to Manufacturer Online Fax  Date Application Submitted to Manufacturer 10/15/2023 10/31/2023       [employer] [phone]

## 2023-11-05 ENCOUNTER — Encounter: Payer: Self-pay | Admitting: Student

## 2023-11-05 ENCOUNTER — Other Ambulatory Visit: Payer: Self-pay

## 2023-11-05 ENCOUNTER — Ambulatory Visit (INDEPENDENT_AMBULATORY_CARE_PROVIDER_SITE_OTHER): Payer: Self-pay | Admitting: Student

## 2023-11-05 VITALS — BP 132/75 | HR 77 | Temp 98.0°F | Ht 63.0 in | Wt 221.9 lb

## 2023-11-05 DIAGNOSIS — Z0000 Encounter for general adult medical examination without abnormal findings: Secondary | ICD-10-CM

## 2023-11-05 DIAGNOSIS — R600 Localized edema: Secondary | ICD-10-CM

## 2023-11-05 DIAGNOSIS — E1165 Type 2 diabetes mellitus with hyperglycemia: Secondary | ICD-10-CM

## 2023-11-05 DIAGNOSIS — E119 Type 2 diabetes mellitus without complications: Secondary | ICD-10-CM

## 2023-11-05 DIAGNOSIS — K7460 Unspecified cirrhosis of liver: Secondary | ICD-10-CM

## 2023-11-05 DIAGNOSIS — Z7984 Long term (current) use of oral hypoglycemic drugs: Secondary | ICD-10-CM

## 2023-11-05 LAB — POCT GLYCOSYLATED HEMOGLOBIN (HGB A1C): Hemoglobin A1C: 8.4 % — AB (ref 4.0–5.6)

## 2023-11-05 LAB — GLUCOSE, CAPILLARY: Glucose-Capillary: 165 mg/dL — ABNORMAL HIGH (ref 70–99)

## 2023-11-05 MED ORDER — UNIFINE PENTIPS 32G X 4 MM MISC
1.0000 | Freq: Every day | 2 refills | Status: AC
  Filled 2023-11-05: qty 100, 30d supply, fill #0
  Filled 2024-04-13: qty 100, 30d supply, fill #1
  Filled 2024-05-19: qty 100, 30d supply, fill #2

## 2023-11-05 MED ORDER — NADOLOL 40 MG PO TABS
40.0000 mg | ORAL_TABLET | Freq: Every day | ORAL | 1 refills | Status: DC
  Filled 2023-11-05: qty 90, 90d supply, fill #0

## 2023-11-05 MED ORDER — FUROSEMIDE 40 MG PO TABS
40.0000 mg | ORAL_TABLET | Freq: Every day | ORAL | 1 refills | Status: DC
  Filled 2023-11-05: qty 90, 90d supply, fill #0
  Filled 2024-04-13: qty 90, 90d supply, fill #1

## 2023-11-05 NOTE — Progress Notes (Signed)
CC: diabetes follow up  HPI:  Katherine Garza is a 52 y.o. female living with a history stated below and presents today for diabetes follow up. Please see problem based assessment and plan for additional details. A tele-interpreter was used for this visit.   Past Medical History:  Diagnosis Date   Diabetes mellitus without complication ([facility])    Hepatic cirrhosis ([facility]) 11/27/2014   Compensated, follows with GI and hepatology   Hepatitis C virus infection 01/04/2015   History of malaria    Multiple episodes w/ 2 hospitalizations   Hypertension    Pancytopenia ([facility])    Secondary to cirrhosis   Splenomegaly 11/27/2014   Secondary to cirrhosis   Wound of left foot 11/27/2019    Current Outpatient Medications on File Prior to Visit  Medication Sig Dispense Refill   insulin lispro (HUMALOG KWIKPEN) 100 UNIT/ML KwikPen Inject 10 Units into the skin 2 (two) times daily. 15 mL 3   liraglutide (VICTOZA) 18 MG/3ML SOPN Inject 1.8 mg into the skin daily. 9 mL 2   pantoprazole (PROTONIX) 40 MG tablet Take 1 tablet (40 mg total) by mouth daily. 90 tablet 3   No current facility-administered medications on file prior to visit.    History reviewed. No pertinent family history.  Social History   Socioeconomic History   Marital status: Married    Spouse name: Not on file   Number of children: Not on file   Years of education: Not on file   Highest education level: Not on file  Occupational History   Not on file  Tobacco Use   Smoking status: Never   Smokeless tobacco: Never  Vaping Use   Vaping status: Never Used  Substance and Sexual Activity   Alcohol use: No    Alcohol/week: 0.0 standard drinks of alcohol   Drug use: No   Sexual activity: Not on file  Other Topics Concern   Not on file  Social History Narrative   Moved from [place] in 09/2014 w/ husband and son. Previously worked as a Visual merchandiser w/ cows, Facilities manager, and pigs.    Social Determinants of Health   Financial  Resource Strain: Not on file  Food Insecurity: Low Risk  (04/06/2023)   [facility]   Hunger Vital Sign    Worried About Running Out of Food in the Last Year: Never true    Ran Out of Food in the Last Year: Never true  Transportation Needs: No Transportation Needs (04/06/2023)   [facility]   Transportation    In the past 12 months, has lack of reliable transportation kept you from medical appointments, meetings, work or from getting things needed for daily living? : No  Physical Activity: Not on file  Stress: Not on file  Social Connections: Moderately Integrated (12/19/2022)   Social Connection and Isolation Panel [NHANES]    Frequency of Communication with Friends and Family: More than three times a week    Frequency of Social Gatherings with Friends and Family: More than three times a week    Attends Religious Services: More than 4 times per year    Active Member of [employer] or Organizations: No    Attends Banker Meetings: Never    Marital Status: Married  Catering manager Violence: Not At Risk (12/19/2022)   Humiliation, Afraid, Rape, and Kick questionnaire    Fear of Current or Ex-Partner: No    Emotionally Abused: No    Physically  Abused: No    Sexually Abused: No   Review of Systems: ROS negative except for what is noted on the assessment and plan.  Vitals:   11/05/23 0855  BP: 132/75  Pulse: 77  Temp: 98 F (36.7 C)  TempSrc: Oral  SpO2: 100%  Weight: 221 lb 14.4 oz (100.7 kg)  Height: 5\' 3"  (1.6 m)   Physical Exam: Constitutional: well appearing Cardiovascular: regular rate and rhythm, no m/r/g; no LE edema Pulmonary/Chest: CTA bilaterally, normal respiratory effort Abdominal: soft, non-tender, non-distended MSK: normal bulk and tone Neurological: alert & oriented x 3, no focal deficits Skin: warm and dry Psych: normal mood and behavior Foot exam: no lesions/ulcers, sensation in tact, distal  pulses palpated bilaterally  Assessment & Plan:   Patient seen with Dr. Lafonda Mosses  Type 2 diabetes mellitus (HCC) Hemoglobin A1c today was 8.4, down from 10.3 last visit.  She continues to take Humalog 8 units twice daily and Victoza 1.8 mg daily.  She is temporarily on Humalog while we wait for her Novo Nordisk application for medication assistance to be approved.  Per her CGM, her fasting blood sugars have run between 89-128, and she is usually checking once daily prior to using insulin or having her breakfast.  She denies any polyuria, polydipsia, numbness or tingling. She has not had any recent lows. She has an eye appointment scheduled for February.   I think she could benefit from starting Jardiance 10 mg and a statin, but she does not have insurance at this time so I will check with Durward Mallard about medications which may be cheap or covered for her.    Hepatic cirrhosis (HCC) She is due for a repeat hepatic ultrasound to monitor her cirrhosis, but she is without insurance now, so we will hold off on ordering that until she has insurance coverage. Will continue with nadalol 40 mg and Lasix 40 mg daily.   Health care maintenance I have spoken with Marena Chancy and placed a referral for assistance with insurance coverage options.  Annett Fabian, MD  [facility] Internal Medicine, PGY-1 Phone: [phone] Date 11/05/2023 Time 1:21 PM

## 2023-11-05 NOTE — Progress Notes (Signed)
Internal Medicine Clinic Attending  I was physically present during the key portions of the resident provided service and participated in the medical decision making of patient's management care. I reviewed pertinent patient test results.  The assessment, diagnosis, and plan were formulated together and I agree with the documentation in the resident's note.  Mercie Eon, MD    Complex situation.  We reviewed patient's glucometer today - fasting sugars have been 80s-100s, which is great. No lows.  I don't have data on her post-prandial sugars.  Since A1C is 8.4 and she has had hypoglycemia previously, I don't want to increase her insulin today.  I recommend that we start an SGLT2 inhibitor, so we will talk to our pharmacy team about coverage options today.

## 2023-11-05 NOTE — Patient Instructions (Addendum)
Thank you, Ms.Mykeisha Raftery for allowing [place] to provide your care today. Today we discussed your diabetes and cirrhosis.    As we discussed, you can continue taking your medications as you have been taking them.  I have sent refills to your pharmacy.  We will hold off on making any medication changes or ordering any new labs or scans for now, given your lack of insurance.  I will follow-up with the pharmacist to see what medications can be covered with an assistance program.  I will also reach out to our social worker to have her contact you and work with you to get insurance.  I will give you a call as soon as I have any updates.  Follow up: 2-3 months   We look forward to seeing you next time. Please call our clinic at [phone] if you have any questions or concerns. The best time to call is Monday-Friday from 9am-4pm, but there is someone available 24/7. If after hours or the weekend, call the main hospital number and ask for the Internal Medicine Resident On-Call. If you need medication refills, please notify your pharmacy one week in advance and they will send [place] a request.   Thank you for trusting me with your care. Wishing you the best!   Annett Fabian, MD [facility]

## 2023-11-05 NOTE — Assessment & Plan Note (Signed)
I have spoken with Marena Chancy and placed a referral for assistance with insurance coverage options.

## 2023-11-05 NOTE — Assessment & Plan Note (Addendum)
Hemoglobin A1c today was 8.4, down from 10.3 last visit.  She continues to take Humalog 8 units twice daily and Victoza 1.8 mg daily.  She is temporarily on Humalog while we wait for her Novo Nordisk application for medication assistance to be approved.  Per her CGM, her fasting blood sugars have run between 89-128, and she is usually checking once daily prior to using insulin or having her breakfast.  She denies any polyuria, polydipsia, numbness or tingling. She has not had any recent lows. She has an eye appointment scheduled for February.   I think she could benefit from starting Jardiance 10 mg and a statin, but she does not have insurance at this time so I will check with Durward Mallard about medications which may be cheap or covered for her.

## 2023-11-05 NOTE — Assessment & Plan Note (Addendum)
She is due for a repeat hepatic ultrasound to monitor her cirrhosis, but she is without insurance now, so we will hold off on ordering that until she has insurance coverage. Will continue with nadalol 40 mg and Lasix 40 mg daily.

## 2023-11-06 ENCOUNTER — Other Ambulatory Visit: Payer: Self-pay

## 2023-11-07 NOTE — Progress Notes (Signed)
Pharmacy Medication Assistance Program Note    11/07/2023  Patient ID: Katherine Garza, female   DOB: [birth year], 52 y.o.   MRN: [id number]     10/15/2023 10/31/2023  Outreach Medication One  Initial Outreach Date (Medication One) 10/11/2023   Manufacturer Medication One [employer] Drugs Novolog   Type of Radiographer, therapeutic Assistance   Name of Prescriber GRACE LAU Dickie La  Date Application Received From Provider  10/29/2023  Date Application Submitted to Manufacturer 10/15/2023 10/31/2023  Method Application Sent to Manufacturer Online Fax  Patient Assistance Determination  Approved  Approval Start Date  11/05/2023  Approval End Date  10/27/2024           10/15/2023 10/31/2023  Outreach Medication Two  Initial Outreach Date (Medication Two) 10/11/2023   Manufacturer Medication Two Novo Nordisk   Nordisk Drugs Victoza   Dose of Victoza 1.8MG    Type of Sport and exercise psychologist   Name of Prescriber GRACE LAU Dickie La  Date Application Received From Provider  10/29/2023  Method Application Sent to Manufacturer Online Fax  Date Application Submitted to Manufacturer 10/15/2023 10/31/2023  Patient Assistance Determination  Approved  Approval Start Date  11/05/2023      MEDICATIONS WILL SHIP TO [facility]

## 2023-11-09 ENCOUNTER — Other Ambulatory Visit: Payer: Self-pay

## 2023-11-16 ENCOUNTER — Other Ambulatory Visit: Payer: Self-pay

## 2023-11-19 ENCOUNTER — Other Ambulatory Visit: Payer: Self-pay

## 2023-11-28 ENCOUNTER — Telehealth: Payer: Self-pay

## 2023-11-28 NOTE — Telephone Encounter (Signed)
Informed patients son that her novo nordisk shipment is ready for pickup here at the office.   2 boxes of novolog AND 2 boxes of pen needles are labeled and ready in the med room.

## 2023-11-29 ENCOUNTER — Ambulatory Visit (INDEPENDENT_AMBULATORY_CARE_PROVIDER_SITE_OTHER): Payer: Medicaid Other | Admitting: Licensed Clinical Social Worker

## 2023-11-29 DIAGNOSIS — Z0000 Encounter for general adult medical examination without abnormal findings: Secondary | ICD-10-CM

## 2023-11-29 NOTE — BH Specialist Note (Unsigned)
Integrated Behavioral Health Initial In-Person Visit  MRN: [id number] Name: Katherine Garza  Number of Integrated Behavioral Health Clinician visits: 1- Initial Visit  Session Start time: 1445    Session End time: 1545  Total time in minutes: 60   Types of Service: {CHL AMB TYPE OF SERVICE:[phone]}  Interpretor:{yes NW:[id number]} Interpretor Name and Language: ***   Warm Hand Off Completed.        Subjective: Katherine Garza is a 52 y.o. female accompanied by {CHL AMB ACCOMPANIED HY:[id number]} Patient was referred by *** for ***. Patient reports the following symptoms/concerns: *** Duration of problem: ***; Severity of problem: {Mild/Moderate/Severe:20260}  Objective: Mood: {BHH MOOD:22306} and Affect: {BHH AFFECT:22307} Risk of harm to self or others: {CHL AMB BH Suicide Current Mental Status:21022748}  Life Context: Family and Social: *** School/Work: *** Self-Care: *** Life Changes: ***  Patient and/or Family's Strengths/Protective Factors: {CHL AMB BH PROTECTIVE [phone]}  Goals Addressed: Patient will: Reduce symptoms of: {IBH Symptoms:21014056} Increase knowledge and/or ability of: {IBH Patient Tools:21014057}  Demonstrate ability to: {IBH Goals:21014053}  Progress towards Goals: {CHL AMB BH PROGRESS TOWARDS GOALS:4101642887}  Interventions: Interventions utilized: {IBH Interventions:21014054}  Standardized Assessments completed: {IBH Screening Tools:21014051}  Patient and/or Family Response: ***  Patient Centered Plan: Patient is on the following Treatment Plan(s):  ***  Assessment: Patient currently experiencing ***.   Patient may benefit from ***.  Plan: Follow up with behavioral health clinician on : *** Behavioral recommendations: *** Referral(s): {IBH Referrals:21014055} "From scale of 1-10, how likely are you to follow plan?": ***  Elmer Sow

## 2023-11-30 ENCOUNTER — Other Ambulatory Visit: Payer: Self-pay

## 2023-12-18 ENCOUNTER — Other Ambulatory Visit: Payer: Self-pay

## 2023-12-19 NOTE — Telephone Encounter (Signed)
 Medication picked up by pt's dtr .Katherine Spittle Cassady1/8/20251:06 PM

## 2024-01-02 ENCOUNTER — Telehealth: Payer: Self-pay

## 2024-01-02 NOTE — Telephone Encounter (Signed)
Left message informing patient her other medication from novo nordisk is ready for pickup.  4 boxes of Victoza are labeled and ready in med room fridge.

## 2024-01-07 NOTE — Telephone Encounter (Signed)
Patient's daughter here to pick up samples of Victoza.  Given 4 boxes.

## 2024-02-13 ENCOUNTER — Telehealth: Payer: Self-pay

## 2024-02-13 DIAGNOSIS — E1165 Type 2 diabetes mellitus with hyperglycemia: Secondary | ICD-10-CM

## 2024-02-13 NOTE — Telephone Encounter (Signed)
 Unfortunately novo nordisk has discontinued Victoza on their patient assistance program. This has caused shipment cancellations and delays.   [facility] pharmacy may be able to fill medication for patient at no charge (on [facility] program).   Patient picked up last shipment in January for a 4 month supply. Future fills will need to be filled at the pharmacy, unless theres a change in therapy.

## 2024-02-19 ENCOUNTER — Other Ambulatory Visit: Payer: Self-pay

## 2024-02-19 MED ORDER — LIRAGLUTIDE 18 MG/3ML ~~LOC~~ SOPN
1.8000 mg | PEN_INJECTOR | Freq: Every day | SUBCUTANEOUS | 2 refills | Status: DC
  Filled 2024-02-19: qty 9, 30d supply, fill #0

## 2024-02-19 NOTE — Telephone Encounter (Signed)
 Victoza 1.8 mg sent to [facility] pharmacy.  I called using [employer] interpreter and interpreter left voicemail for patient letting her know that Ludger Nutting had been sent to [facility] pharmacy.

## 2024-02-19 NOTE — Addendum Note (Signed)
 Addended by: Lucille Passy on: 02/19/2024 01:27 PM   Modules accepted: Orders

## 2024-02-19 NOTE — Telephone Encounter (Signed)
 I was notified by pharmacist that [facility] that Victoza is not covered with [facility].  Trulicity is covered with [facility].  Patient is overdue for follow-up for A1c.  I think she would do better with in person visit to review discontinuing Victoza and starting Trulicity.  -Message sent to front desk to help coordinate follow-up for this patient within 2 to 4 weeks  At follow-up visit we will plan to repeat A1c and then switch from Victoza to Trulicity.  Would like to review change in pharmacy with patient in person.

## 2024-03-03 ENCOUNTER — Telehealth: Payer: Self-pay

## 2024-03-03 ENCOUNTER — Other Ambulatory Visit (HOSPITAL_COMMUNITY): Payer: Self-pay

## 2024-03-03 ENCOUNTER — Other Ambulatory Visit: Payer: Self-pay

## 2024-03-03 ENCOUNTER — Ambulatory Visit: Admitting: Internal Medicine

## 2024-03-03 ENCOUNTER — Telehealth: Payer: Self-pay | Admitting: *Deleted

## 2024-03-03 VITALS — BP 121/63 | HR 72 | Temp 97.5°F | Ht 63.0 in | Wt 221.2 lb

## 2024-03-03 DIAGNOSIS — Z7985 Long-term (current) use of injectable non-insulin antidiabetic drugs: Secondary | ICD-10-CM

## 2024-03-03 DIAGNOSIS — Z1231 Encounter for screening mammogram for malignant neoplasm of breast: Secondary | ICD-10-CM

## 2024-03-03 DIAGNOSIS — K259 Gastric ulcer, unspecified as acute or chronic, without hemorrhage or perforation: Secondary | ICD-10-CM

## 2024-03-03 DIAGNOSIS — K7460 Unspecified cirrhosis of liver: Secondary | ICD-10-CM | POA: Diagnosis not present

## 2024-03-03 DIAGNOSIS — E1165 Type 2 diabetes mellitus with hyperglycemia: Secondary | ICD-10-CM

## 2024-03-03 DIAGNOSIS — G629 Polyneuropathy, unspecified: Secondary | ICD-10-CM

## 2024-03-03 DIAGNOSIS — Z0000 Encounter for general adult medical examination without abnormal findings: Secondary | ICD-10-CM

## 2024-03-03 DIAGNOSIS — Z7984 Long term (current) use of oral hypoglycemic drugs: Secondary | ICD-10-CM | POA: Diagnosis not present

## 2024-03-03 LAB — POCT GLYCOSYLATED HEMOGLOBIN (HGB A1C): Hemoglobin A1C: 8.1 % — AB (ref 4.0–5.6)

## 2024-03-03 LAB — GLUCOSE, CAPILLARY: Glucose-Capillary: 115 mg/dL — ABNORMAL HIGH (ref 70–99)

## 2024-03-03 MED ORDER — NADOLOL 40 MG PO TABS
40.0000 mg | ORAL_TABLET | Freq: Every day | ORAL | 1 refills | Status: DC
  Filled 2024-03-03: qty 90, 90d supply, fill #0
  Filled 2024-08-04: qty 90, 90d supply, fill #1

## 2024-03-03 MED ORDER — PANTOPRAZOLE SODIUM 40 MG PO TBEC
40.0000 mg | DELAYED_RELEASE_TABLET | Freq: Every day | ORAL | 3 refills | Status: AC
  Filled 2024-03-03: qty 90, 90d supply, fill #0
  Filled 2024-08-04: qty 90, 90d supply, fill #1
  Filled 2024-11-18: qty 30, 30d supply, fill #2
  Filled 2024-12-22: qty 30, 30d supply, fill #3

## 2024-03-03 MED ORDER — GABAPENTIN 300 MG PO CAPS
300.0000 mg | ORAL_CAPSULE | Freq: Every day | ORAL | 2 refills | Status: DC
Start: 2024-03-03 — End: 2024-08-04
  Filled 2024-03-03: qty 30, 30d supply, fill #0
  Filled 2024-04-13: qty 30, 30d supply, fill #1
  Filled 2024-05-19: qty 30, 30d supply, fill #2

## 2024-03-03 MED ORDER — METFORMIN HCL 500 MG PO TABS
500.0000 mg | ORAL_TABLET | Freq: Every day | ORAL | 11 refills | Status: DC
Start: 2024-03-03 — End: 2024-10-06
  Filled 2024-03-03: qty 30, 30d supply, fill #0
  Filled 2024-04-13: qty 30, 30d supply, fill #1
  Filled 2024-05-19: qty 30, 30d supply, fill #2
  Filled 2024-08-04: qty 30, 30d supply, fill #3
  Filled 2024-09-24: qty 30, 30d supply, fill #4

## 2024-03-03 MED ORDER — TRULICITY 0.75 MG/0.5ML ~~LOC~~ SOAJ
0.7500 mg | SUBCUTANEOUS | 3 refills | Status: DC
  Filled 2024-03-03 – 2024-05-19 (×3): qty 2, 28d supply, fill #0
  Filled 2024-09-24: qty 2, 28d supply, fill #1

## 2024-03-03 NOTE — Telephone Encounter (Signed)
 Mammogram appointment reminder letter mailed/ 75.00 no show fee information included.  03/21/2024 @ 11:10 am - [facility] center. [phone]

## 2024-03-03 NOTE — Telephone Encounter (Signed)
 Prior Authorization for patient (Nadolol 40MG  tablets) came through on cover my meds was submitted with last office notes awaiting approval or denial.  [id number] 40MG  tablets

## 2024-03-03 NOTE — Patient Instructions (Addendum)
 Thank you, Ms.Naliya Rentz for allowing [place] to provide your care today.   Diabetes Your A1c is better today at 8.1. The goal is to get this number less than 7 to decrease risk of complications from diabetes.  Continue humalog 10 units 2 times daily Switch victoza daily to trulicity 0.75 mg weekly once you have ran out of victoza. START metformin 500 mg daily Follow-up in 3 months.  I am starting a medication called gabapentin to help with the burning pain in your feet. Take this at night before bed.  Please be sure you are up to date with yearly diabetic eye exam.  Liver scarring I have ordered an ultrasound to screen for liver cancer. I have included the phone number for the liver clinic below.  Drazek, Stoney Bang, NP  [address]  [address]  [place], [place] [address]  Phone: [phone]  Fax: [phone]   Asante, Bi.Conner [facility]clin kwa kuturuhusu [employer].   Kisukari A1c yako ni bora zaidi leo saa 8.1. Lengo ni kupata nambari hii chini ya 7 ili kupunguza hatari ya matatizo ya kisukari.  Endelea humalog vitengo 10 mara 2 kila siku [place] victoza kila siku hadi trulicity 0.75 mg kila wiki mara baada ya kuishiwa na victoza. Anza metformin 500 mg kila siku Ufuatiliaji ndani ya miezi 3.  Tafadhali [id number] Harrington Challenger na mtihani wa macho wa kisukari wa kila mwaka. Ninaanzisha dawa iitwayo gabapentin ili kusaidia na maumivu ya moto kwenye miguu yako. [place] hii usiku kabla ya [place].  Kovu kwenye ini Nimeagiza upimaji wa ultrasound kuchunguza saratani ya ini. Nimejumuisha nambari ya simu ya kliniki ya ini hapa chini.  Drazek, Stoney Bang, NP  [address]  [address]  [place], [place] [address]  Simu: [phone]  Faksi: [phone]  I have ordered the following labs for you:  Lab Orders         BMP8+Anion Gap         Microalbumin / Creatinine Urine Ratio         Glucose, capillary         POC Hbg A1C      Referrals ordered today:    Referral Orders         Ambulatory referral to Ophthalmology      I have ordered the following medication/changed the following medications:   Stop the following medications: Medications Discontinued During This Encounter  Medication Reason   liraglutide (VICTOZA) 18 MG/3ML SOPN      Start the following medications: Meds ordered this encounter  Medications   Dulaglutide (TRULICITY) 0.75 MG/0.5ML SOAJ    Sig: Inject 0.75 mg into the skin once a week.    Dispense:  2 mL    Refill:  3   metFORMIN (GLUCOPHAGE) 500 MG tablet    Sig: Take 1 tablet (500 mg total) by mouth daily with breakfast.    Dispense:  30 tablet    Refill:  11   gabapentin (NEURONTIN) 300 MG capsule    Sig: Take 1 capsule (300 mg total) by mouth at bedtime.    Dispense:  30 capsule    Refill:  2     Follow up: 3 months   We look forward to seeing you next time. Please call our clinic at [phone] if you have any questions or concerns. The best time to call is Monday-Friday from 9am-4pm, but there is someone available 24/7. If after hours or the weekend, call the main hospital number and ask for the  Internal Medicine Resident On-Call. If you need medication refills, please notify your pharmacy one week in advance and they will send [place] a request.   Thank you for trusting me with your care. Wishing you the best!   Rudene Christians, DO [facility]

## 2024-03-03 NOTE — Telephone Encounter (Signed)
 Prior Authorization for patient (Trulicity 0.75MG /0.5ML auto-injectors) came through on cover my meds was submitted with last office notes awaiting approval or denial.  RUE:[id number]

## 2024-03-03 NOTE — Progress Notes (Unsigned)
 Subjective:  CC: follow-up  HPI:  Katherine Garza is a 53 y.o. female with a past medical history of history of decompensated cirrhosis with esophageal varices, iron deficiency anemia, pancytopenia, type 2 diabetes who presents today for follow-up.   Please see problem based assessment and plan for additional details.  Past Medical History:  Diagnosis Date   Diabetes mellitus without complication (HCC)    Hepatic cirrhosis (HCC) 11/27/2014   Compensated, follows with GI and hepatology   Hepatitis C virus infection 01/04/2015   History of malaria    Multiple episodes w/ 2 hospitalizations   Hypertension    Pancytopenia (HCC)    Secondary to cirrhosis   Splenomegaly 11/27/2014   Secondary to cirrhosis   Wound of left foot 11/27/2019   MEDICATIONS:  Victoza 1.8 mg daily Humalog 10 units twice daily Lasix 40 mg Nadolol 40 mg daily Pantoprazole 40 mg daily  Past Surgical History:  Procedure Laterality Date   BIOPSY  11/01/2021   Procedure: BIOPSY;  Surgeon: Kerin Salen, MD;  Location: [facility];  Service: Gastroenterology;;   COLONOSCOPY WITH PROPOFOL N/A 12/06/2020   Procedure: COLONOSCOPY WITH PROPOFOL;  Surgeon: Kerin Salen, MD;  Location: [facility];  Service: Gastroenterology;  Laterality: N/A;   ESOPHAGEAL BANDING  12/06/2020   Procedure: ESOPHAGEAL BANDING;  Surgeon: Kerin Salen, MD;  Location: [facility];  Service: Gastroenterology;;   ESOPHAGOGASTRODUODENOSCOPY (EGD) WITH PROPOFOL N/A 12/06/2020   Procedure: ESOPHAGOGASTRODUODENOSCOPY (EGD) WITH PROPOFOL;  Surgeon: Kerin Salen, MD;  Location: [facility];  Service: Gastroenterology;  Laterality: N/A;   ESOPHAGOGASTRODUODENOSCOPY (EGD) WITH PROPOFOL N/A 03/10/2021   Procedure: ESOPHAGOGASTRODUODENOSCOPY (EGD) WITH PROPOFOL;  Surgeon: Kerin Salen, MD;  Location: [facility];  Service: Gastroenterology;  Laterality: N/A;   ESOPHAGOGASTRODUODENOSCOPY (EGD) WITH PROPOFOL N/A 11/01/2021   Procedure:  ESOPHAGOGASTRODUODENOSCOPY (EGD) WITH PROPOFOL;  Surgeon: Kerin Salen, MD;  Location: [facility];  Service: Gastroenterology;  Laterality: N/A;   FOOT SURGERY     GASTRIC VARICES BANDING N/A 03/10/2021   Procedure: GASTRIC VARICES BANDING;  Surgeon: Kerin Salen, MD;  Location: [facility];  Service: Gastroenterology;  Laterality: N/A;   POLYPECTOMY  12/06/2020   Procedure: POLYPECTOMY;  Surgeon: Kerin Salen, MD;  Location: [facility];  Service: Gastroenterology;;     Social History   Socioeconomic History   Marital status: Married    Spouse name: Not on file   Number of children: Not on file   Years of education: Not on file   Highest education level: Not on file  Occupational History   Not on file  Tobacco Use   Smoking status: Never   Smokeless tobacco: Never  Vaping Use   Vaping status: Never Used  Substance and Sexual Activity   Alcohol use: No    Alcohol/week: 0.0 standard drinks of alcohol   Drug use: No   Sexual activity: Not on file  Other Topics Concern   Not on file  Social History Narrative   Moved from [place] in 09/2014 w/ husband and son. Previously worked as a Visual merchandiser w/ cows, Facilities manager, and pigs.    Social Drivers of Corporate investment banker Strain: Not on file  Food Insecurity: Low Risk  (04/06/2023)   [facility]   Hunger Vital Sign    Worried About Running Out of Food in the Last Year: Never true    Ran Out of Food in the Last Year: Never true  Transportation Needs: No Transportation Needs (04/06/2023)   [facility]   Transportation    In the past 12 months, has lack of reliable transportation kept you from medical appointments, meetings, work or from getting things needed for daily living? : No  Physical Activity: Not on file  Stress: Not on file  Social Connections: Moderately Integrated (12/19/2022)   Social Connection and Isolation Panel [NHANES]    Frequency of Communication with  Friends and Family: More than three times a week    Frequency of Social Gatherings with Friends and Family: More than three times a week    Attends Religious Services: More than 4 times per year    Active Member of [employer] or Organizations: No    Attends Banker Meetings: Never    Marital Status: Married  Catering manager Violence: Not At Risk (12/19/2022)   Humiliation, Afraid, Rape, and Kick questionnaire    Fear of Current or Ex-Partner: No    Emotionally Abused: No    Physically Abused: No    Sexually Abused: No    Review of Systems: ROS negative except for what is noted on the assessment and plan.  Objective:   Vitals:   03/03/24 1052  BP: 121/63  Pulse: 72  Temp: (!) 97.5 F (36.4 C)  TempSrc: Oral  SpO2: 99%  Weight: 221 lb 3.2 oz (100.3 kg)  Height: 5\' 3"  (1.6 m)    Physical Exam: Constitutional: well-appearing , in no acute distress Cardiovascular: regular rate and rhythm, no m/r/g Pulmonary/Chest: normal work of breathing on room air, lungs clear to auscultation bilaterally Abdominal: soft, non-tender, non-distended MSK: normal bulk and tone Neurological: alert & oriented x 3, normal gait Skin: warm and dry   Assessment & Plan:  Type 2 diabetes mellitus (HCC) She was last seen in November 2024.  At that time A1c had improved from 10.3-8.4.  Hemoglobin A1c at 8.1% in office. I was contacted by pharmacist [facility] community pharmacy that victoza is no longer covered and that she could be switched to trulicity.  I talked with patient about what medication she has been taking as I was concerned she would run out of Victoza.  She actually has a supply of Victoza until about mid May.  Medications include Victoza 1.8 mg and Humalog 10 units twice daily.  Checking her blood sugars with a glucometer which she brought to visit today.  On review of fasting blood sugars her values are between 90-150.  No low blood sugars noted.  She only checks her blood sugar once  daily prior to eating breakfast.  She only eats 2 meals a day. P: Continue Victoza until supply runs out.  I confirmed that she has been taking this daily.  I talked with her and her daughter once supply of Actos runs out that she could switch to once weekly Trulicity and sent in medication. Continue Humalog 10 units twice daily.  Start metformin 500 mg, liver function is stable with Child-Pugh class A could cautiously titrate in the future. Would also consider addition of SGLT in future with long term goal to stop insulin  Hepatic cirrhosis (HCC) History of hepatitis C s/p treatment. She has followed with liver clinic but missed recent follow-up due to lapse in insurance. She now has insurance and I talked with her and her daughter about needing to follow-up with them. Her last EGD was 11/22 and she had grade 1 esophageal varices at that time. She remains on nadolol 40 mg daily. -RUQ [place] ordered for HCC screening -continue nadolol 40  mg qd  Health care maintenance Screening mammogram ordered   Patient discussed with Dr. Marjorie Smolder Lian Pounds, D.O. [facility]  PGY-3 Pager: [phone]  Phone: [phone] Date 03/04/2024  Time 5:15 PM

## 2024-03-03 NOTE — Telephone Encounter (Signed)
 Katherine Garza (Key: [id number]) PA Case ID #: [id number] Rx #: [id number] Need Help? Call us at [phone] Outcome Approved today by [facility] PA Case: [id number], Status: Approved, Coverage Starts on: 03/03/2024 12:00:00 AM, Coverage Ends on: 03/03/2025 12:00:00 AM. Effective Date: 03/02/2024 Authorization Expiration Date: 03/02/2025 Drug Nadolol 40MG  tablets ePA cloud logo Form [facility] [place] Form [phone] NCPDP) Original Claim Info [phone] PRODUCT NON-PREFERRED. BILL PREFERREDALTERNATIVE OR REQUEST EXCEPTION.FOR 3 DS O/R, USE [facility] [id number] DRUG REQUIRES PRIOR AUTHORIZATION

## 2024-03-03 NOTE — Telephone Encounter (Signed)
 Katherine Garza ([name]) PA Case ID #: [id number] Rx #: [id number] Need Help? Call us at [phone] Outcome Approved today by [facility] PA Case: [id number], Status: Approved, Coverage Starts on: 03/03/2024 12:00:00 AM, Coverage Ends on: 03/03/2025 12:00:00 AM. Effective Date: 03/02/2024 Authorization Expiration Date: 03/02/2025 Drug Trulicity 0.75MG /0.5ML auto-injectors ePA cloud logo Form [facility] [place] [employer] Electronic [place] Form [phone] NCPDP) Original Claim Info 75 T/F METFORMIN REQUIRED; SUBMIT PA FOREXCEPTIONFOR 3 DS O/R, USE [facility] [id number] DRUG REQUIRES PRIOR AUTHORIZATION

## 2024-03-04 ENCOUNTER — Other Ambulatory Visit: Payer: Self-pay

## 2024-03-04 LAB — BMP8+ANION GAP
Anion Gap: 14 mmol/L (ref 10.0–18.0)
BUN/Creatinine Ratio: 11 (ref 9–23)
BUN: 7 mg/dL (ref 6–24)
CO2: 24 mmol/L (ref 20–29)
Calcium: 9.4 mg/dL (ref 8.7–10.2)
Chloride: 104 mmol/L (ref 96–106)
Creatinine, Ser: 0.64 mg/dL (ref 0.57–1.00)
Glucose: 102 mg/dL — ABNORMAL HIGH (ref 70–99)
Potassium: 3.8 mmol/L (ref 3.5–5.2)
Sodium: 142 mmol/L (ref 134–144)
eGFR: 104 mL/min/{1.73_m2} (ref >59)

## 2024-03-04 NOTE — Assessment & Plan Note (Signed)
 History of hepatitis C s/p treatment. She has followed with liver clinic but missed recent follow-up due to lapse in insurance. She now has insurance and I talked with her and her daughter about needing to follow-up with them. Her last EGD was 11/22 and she had grade 1 esophageal varices at that time. She remains on nadolol 40 mg daily. -RUQ [place] ordered for HCC screening -continue nadolol 40 mg qd

## 2024-03-04 NOTE — Assessment & Plan Note (Signed)
 Screening mammogram ordered

## 2024-03-04 NOTE — Assessment & Plan Note (Addendum)
 She was last seen in November 2024.  At that time A1c had improved from 10.3-8.4.  Hemoglobin A1c at 8.1% in office. I was contacted by pharmacist [facility] community pharmacy that victoza is no longer covered and that she could be switched to trulicity.  I talked with patient about what medication she has been taking as I was concerned she would run out of Victoza.  She actually has a supply of Victoza until about mid May.  Medications include Victoza 1.8 mg and Humalog 10 units twice daily.  Checking her blood sugars with a glucometer which she brought to visit today.  On review of fasting blood sugars her values are between 90-150.  No low blood sugars noted.  She only checks her blood sugar once daily prior to eating breakfast.  She only eats 2 meals a day. P: Continue Victoza until supply runs out.  I confirmed that she has been taking this daily.  I talked with her and her daughter once supply of Actos runs out that she could switch to once weekly Trulicity and sent in medication. Continue Humalog 10 units twice daily.  Start metformin 500 mg, liver function is stable with Child-Pugh class A could cautiously titrate in the future. Would also consider addition of SGLT in future with long term goal to stop insulin

## 2024-03-05 ENCOUNTER — Other Ambulatory Visit: Payer: Self-pay

## 2024-03-05 LAB — MICROALBUMIN / CREATININE URINE RATIO
Creatinine, Urine: 241.6 mg/dL
Microalb/Creat Ratio: 19 mg/g{creat} (ref 0–29)
Microalbumin, Urine: 46.6 ug/mL

## 2024-03-07 ENCOUNTER — Encounter: Payer: Self-pay | Admitting: Internal Medicine

## 2024-03-10 ENCOUNTER — Ambulatory Visit (HOSPITAL_COMMUNITY)
Admission: RE | Admit: 2024-03-10 | Discharge: 2024-03-10 | Disposition: A | Discharge status: Home / Self Care | Source: Ambulatory Visit | Attending: Internal Medicine | Admitting: Internal Medicine

## 2024-03-10 DIAGNOSIS — K7460 Unspecified cirrhosis of liver: Secondary | ICD-10-CM | POA: Insufficient documentation

## 2024-03-10 DIAGNOSIS — K8020 Calculus of gallbladder without cholecystitis without obstruction: Secondary | ICD-10-CM | POA: Diagnosis not present

## 2024-03-12 NOTE — Progress Notes (Signed)
 Internal Medicine Clinic Attending  Case discussed with the resident at the time of the visit.  We reviewed the resident's history and exam and pertinent patient test results.  I agree with the assessment, diagnosis, and plan of care documented in the resident's note.

## 2024-03-15 ENCOUNTER — Ambulatory Visit
Admission: RE | Admit: 2024-03-15 | Discharge: 2024-03-15 | Disposition: A | Discharge status: Home / Self Care | Source: Ambulatory Visit | Attending: Internal Medicine | Admitting: Internal Medicine

## 2024-03-15 DIAGNOSIS — Z1231 Encounter for screening mammogram for malignant neoplasm of breast: Secondary | ICD-10-CM | POA: Diagnosis not present

## 2024-03-21 ENCOUNTER — Ambulatory Visit

## 2024-03-26 ENCOUNTER — Telehealth: Payer: Self-pay

## 2024-03-26 NOTE — Telephone Encounter (Signed)
 Patients novo nordisk patient assistance order of Novolog Flexpens (and pen needles) has arrived and are ready for pickup.   Is patient still taking 10 units twice daily (per humalog directions that are on file)?

## 2024-04-13 ENCOUNTER — Other Ambulatory Visit: Payer: Self-pay

## 2024-04-14 ENCOUNTER — Other Ambulatory Visit: Payer: Self-pay

## 2024-04-16 NOTE — Telephone Encounter (Signed)
 Spoke with patients daughter, she will be in to pickup meds on Friday morning (04/18/24)

## 2024-04-17 ENCOUNTER — Other Ambulatory Visit (HOSPITAL_COMMUNITY): Payer: Self-pay

## 2024-04-17 NOTE — Telephone Encounter (Addendum)
 She was recently put on Trulicity , she may think this is insulin ?  Just seen patient now has medicaid. She will no longer be eligible for assistance after this pickup.

## 2024-04-17 NOTE — Telephone Encounter (Signed)
 Pt's daughter picked up 2 boxes of Novolog  and 2 boxes of pen needles. She stated she thought pt suppose to be on new insulin , no more novolog ? - sending to the doctor.

## 2024-04-22 ENCOUNTER — Other Ambulatory Visit: Payer: Self-pay

## 2024-05-14 ENCOUNTER — Other Ambulatory Visit (HOSPITAL_COMMUNITY): Payer: Self-pay

## 2024-05-16 NOTE — Telephone Encounter (Signed)
 Enrollment withdrawn with novo nordisk, patient has medicaid.

## 2024-05-19 ENCOUNTER — Other Ambulatory Visit: Payer: Self-pay

## 2024-05-21 ENCOUNTER — Other Ambulatory Visit: Payer: Self-pay

## 2024-05-30 ENCOUNTER — Encounter: Payer: Self-pay | Admitting: *Deleted

## 2024-08-04 ENCOUNTER — Other Ambulatory Visit: Payer: Self-pay

## 2024-08-04 ENCOUNTER — Other Ambulatory Visit: Payer: Self-pay | Admitting: Student

## 2024-08-04 ENCOUNTER — Other Ambulatory Visit: Payer: Self-pay | Admitting: Internal Medicine

## 2024-08-04 DIAGNOSIS — R600 Localized edema: Secondary | ICD-10-CM

## 2024-08-04 DIAGNOSIS — G629 Polyneuropathy, unspecified: Secondary | ICD-10-CM

## 2024-08-04 DIAGNOSIS — K7460 Unspecified cirrhosis of liver: Secondary | ICD-10-CM

## 2024-08-04 MED ORDER — FUROSEMIDE 40 MG PO TABS
40.0000 mg | ORAL_TABLET | Freq: Every day | ORAL | 0 refills | Status: DC
  Filled 2024-08-04: qty 90, 90d supply, fill #0

## 2024-08-11 MED ORDER — GABAPENTIN 300 MG PO CAPS
300.0000 mg | ORAL_CAPSULE | Freq: Every day | ORAL | 2 refills | Status: AC
  Filled 2024-08-11: qty 30, 30d supply, fill #0
  Filled 2024-09-24: qty 30, 30d supply, fill #1
  Filled 2024-12-22: qty 30, 30d supply, fill #2

## 2024-08-12 ENCOUNTER — Other Ambulatory Visit: Payer: Self-pay

## 2024-08-18 LAB — HM DIABETES EYE EXAM

## 2024-09-01 ENCOUNTER — Ambulatory Visit: Payer: Self-pay

## 2024-09-24 ENCOUNTER — Other Ambulatory Visit: Payer: Self-pay

## 2024-09-25 ENCOUNTER — Other Ambulatory Visit: Payer: Self-pay

## 2024-09-29 ENCOUNTER — Other Ambulatory Visit: Payer: Self-pay

## 2024-10-01 ENCOUNTER — Other Ambulatory Visit: Payer: Self-pay

## 2024-10-06 ENCOUNTER — Other Ambulatory Visit: Payer: Self-pay

## 2024-10-06 DIAGNOSIS — E1165 Type 2 diabetes mellitus with hyperglycemia: Secondary | ICD-10-CM

## 2024-10-07 ENCOUNTER — Other Ambulatory Visit: Payer: Self-pay

## 2024-10-07 MED ORDER — METFORMIN HCL 500 MG PO TABS
500.0000 mg | ORAL_TABLET | Freq: Every day | ORAL | 11 refills | Status: AC
  Filled 2024-10-07: qty 30, 30d supply, fill #0
  Filled 2024-11-18: qty 30, 30d supply, fill #1
  Filled 2024-12-22: qty 30, 30d supply, fill #2

## 2024-10-07 MED ORDER — INSULIN LISPRO (1 UNIT DIAL) 100 UNIT/ML (KWIKPEN)
10.0000 [IU] | PEN_INJECTOR | Freq: Two times a day (BID) | SUBCUTANEOUS | 3 refills | Status: AC
  Filled 2024-10-07: qty 15, 75d supply, fill #0
  Filled 2024-12-22: qty 15, 75d supply, fill #1

## 2024-10-07 MED ORDER — TRULICITY 0.75 MG/0.5ML ~~LOC~~ SOAJ
0.7500 mg | SUBCUTANEOUS | 3 refills | Status: AC
  Filled 2024-10-07: qty 2, 28d supply, fill #0
  Filled 2024-11-18: qty 2, 28d supply, fill #1
  Filled 2024-12-22: qty 2, 28d supply, fill #2

## 2024-10-07 NOTE — Telephone Encounter (Signed)
 Medications approved, but please schedule for clinic visit. LOV 02/2024

## 2024-10-08 NOTE — Telephone Encounter (Addendum)
 Pt called to schedule an appt - no answer; left message on vm. I will ask front office to call pt again.

## 2024-10-09 ENCOUNTER — Other Ambulatory Visit: Payer: Self-pay

## 2024-10-13 ENCOUNTER — Other Ambulatory Visit (HOSPITAL_COMMUNITY): Payer: Self-pay

## 2024-10-13 ENCOUNTER — Ambulatory Visit: Payer: Self-pay

## 2024-10-13 VITALS — BP 122/66 | HR 73 | Temp 98.0°F | Ht 63.0 in | Wt 219.6 lb

## 2024-10-13 DIAGNOSIS — R011 Cardiac murmur, unspecified: Secondary | ICD-10-CM | POA: Diagnosis not present

## 2024-10-13 DIAGNOSIS — Z7985 Long-term (current) use of injectable non-insulin antidiabetic drugs: Secondary | ICD-10-CM

## 2024-10-13 DIAGNOSIS — E785 Hyperlipidemia, unspecified: Secondary | ICD-10-CM | POA: Diagnosis not present

## 2024-10-13 DIAGNOSIS — Z794 Long term (current) use of insulin: Secondary | ICD-10-CM | POA: Diagnosis not present

## 2024-10-13 DIAGNOSIS — K7460 Unspecified cirrhosis of liver: Secondary | ICD-10-CM

## 2024-10-13 DIAGNOSIS — Z8719 Personal history of other diseases of the digestive system: Secondary | ICD-10-CM | POA: Diagnosis not present

## 2024-10-13 DIAGNOSIS — E1165 Type 2 diabetes mellitus with hyperglycemia: Secondary | ICD-10-CM

## 2024-10-13 DIAGNOSIS — M79605 Pain in left leg: Secondary | ICD-10-CM

## 2024-10-13 DIAGNOSIS — Z79899 Other long term (current) drug therapy: Secondary | ICD-10-CM

## 2024-10-13 DIAGNOSIS — I8510 Secondary esophageal varices without bleeding: Secondary | ICD-10-CM | POA: Diagnosis not present

## 2024-10-13 LAB — POCT GLYCOSYLATED HEMOGLOBIN (HGB A1C): HbA1c, POC (controlled diabetic range): 7.7 % — AB (ref 0.0–7.0)

## 2024-10-13 LAB — GLUCOSE, CAPILLARY: Glucose-Capillary: 125 mg/dL — ABNORMAL HIGH (ref 70–99)

## 2024-10-13 MED ORDER — DICLOFENAC SODIUM 1 % EX GEL
2.0000 g | Freq: Every day | CUTANEOUS | 0 refills | Status: AC | PRN
  Filled 2024-10-13: qty 100, 20d supply, fill #0
  Filled 2024-10-15: qty 100, 25d supply, fill #0

## 2024-10-13 NOTE — Assessment & Plan Note (Addendum)
 Lab Results  Component Value Date   HGBA1C 7.7 (A) 10/13/2024   HGBA1C 8.1 (A) 03/03/2024   HGBA1C 8.4 (A) 11/05/2023       Latest Ref Rng & Units 03/03/2024   11:54 AM 02/16/2023   11:05 AM 12/19/2021   11:16 AM  BMP  Glucose 70 - 99 mg/dL 897  95  837   BUN 6 - 24 mg/dL 7  13  13    Creatinine 0.57 - 1.00 mg/dL 9.35  9.38  9.34   BUN/Creat Ratio 9 - 23 11  21     Sodium 134 - 144 mmol/L 142  141  142   Potassium 3.5 - 5.2 mmol/L 3.8  3.9  4.1   Chloride 96 - 106 mmol/L 104  101  108   CO2 20 - 29 mmol/L 24  25  29    Calcium 8.7 - 10.2 mg/dL 9.4  9.2  9.8     Medications: Reported Humalog  10 units twice daily, Trulicity  0.75 mg once weekly.  Patient denied taking metformin  500 mg.  Blood Sugars at home: Checking once before meals, patient brought glucometer.  On review, majority around 120s. Sometimes around 140s.  Peak around 160s. Diet and Exercise: staying busy.  Diet consisting of corn meal, greens, fish, red meat once in a while.  Diabetic Counseling: met with Arland 2024, not interested in meeting with her again at this time.  Stated that she feels well to continue current regimen. Opthalmology: 2025 negative for retinopathy  Neuropathy: does not take gabapentin . No complaints of neuropathy  Lipids: 02/16/2023: LDL 111, HDL 35. Last microalbumin:Cr ratio WNL. The 10-year ASCVD risk score (Arnett DK, et al., 2019) is: 8.1%   Values used to calculate the score:     Age: 53 years     Clincally relevant sex: Female     Is Non-Hispanic African American: Yes     Diabetic: Yes     Tobacco smoker: No     Systolic Blood Pressure: 122 mmHg     Is BP treated: No     HDL Cholesterol: 35 mg/dL     Total Cholesterol: 160 mg/dL  J8R improved from 8.1 to 7.7.  -Continue current diabetic regimen with Humalog  10 units twice daily and Trulicity  0.75 mg once weekly.  Consider SGLT2 at next office visit. - Consider starting statin at next visit in 3 months, deferring at this time with history of  left lower extremity pain.

## 2024-10-13 NOTE — Patient Instructions (Addendum)
 Today we discussed the following medical conditions and plan:   Left Leg: - I have sent in for ultrasound of your left leg. You should get a call soon about scheduling this. If you have any chest pain or shortness of breath, go to the Emergency Department to be seen.  - I will send in a cream for the leg.   Cirrhosis and Esophageal varices  -call [facility] to schedule EGD [phone] on [address], [address] [place], [place] [address] - we will do some lab tests for you today, I will let you know the results  - I have sent in for RUQ ultrasound to look at your liver.   Diabetes - your A1C went down to 7.7 from 8.1, so we will stay the course for now.  - once we treat your leg, I will add a cholesterol medication that you will take daily.   Heart  - if your blood work comes back and you are anemic, we will treat accordingly and this might be the cause of your new heart murmur. If your blood work comes back normal, then we will send you to get an echo (ultrasound) of your heart  We look forward to seeing you next time. Please call our clinic at [phone] if you have any questions or concerns. The best time to call is Monday-Friday from 9am-4pm, but there is someone available 24/7. If you need medication refills, please notify your pharmacy one week in advance and they will send us  a request.   Thank you for trusting me with your care. Wishing you the best!   Sallyanne Primas, DO  [facility]

## 2024-10-13 NOTE — Assessment & Plan Note (Signed)
 Imaging:  -RUQ US  03/10/2024 cholelithiasis without sonographic evidence of acute cholecystitis.  Cirrhotic liver without focal liver lesions identified.  -EGD 11/01/2021 gastric varices banding.  Labs:  - AFP 04/06/2023 = 5.1 - CBC 04/06/2023 = WBC 1.5, RBC 4.33, hemoglobin 11.6, hematocrit 35.0, MCV 81, RDW 14.3, platelets 33 - CMP 04/06/2023 = AST 25/ALT 22, Bilirubin total 0/5, albumin 4.4, Protein total 7.8  Recommendations per National complaints of cancer network and American Association of liver disease guidelines reviewed HCC screening with RUQ ultrasound and AFP every 6 months.  Will check CBC with history of hepatic cirrhosis, esophageal varices, iron deficiency anemia.  EGD recommendations included with history of varices every 1 to 2 years.  Plan:  - continue nadolol  40 mg daily  - continue Protonix  40 mg daily  - continue furosemide  40 mg  - Ordered: EGD, CMP, CBC, RUQ US , AFP

## 2024-10-13 NOTE — Assessment & Plan Note (Signed)
 Newly recognized murmur appreciated grade 3/6 early systolic best heard over right upper sternal border with radiation to carotids.  Echo performed 01/2017 without evidence of aortic stenosis.  Previous physical exams have not mentioned murmur.  Patient has history of iron deficiency anemia, hepatic cirrhosis, esophageal varices.  Denies any new evidence of bleeding, however in the setting of new murmur differential diagnosis includes aortic stenosis versus murmur of anemia.  Patient is asymptomatic without chest pain, shortness of breath.  Patient reports overall feeling well. -CBC ordered -Deferring echo at this time

## 2024-10-13 NOTE — Progress Notes (Signed)
 CC: 7-month checkup and left leg pain  HPI:  [name] is a 53 y.o. female with pertinent past medical history of esophageal varices, hepatic cirrhosis, iron deficiency anemia, pancytopenia, hyperlipidemia, multiple gastric ulcers, type 2 diabetes (further medical history stated below) and presents today for 69-month checkup and left leg pain. Please see problem based assessment and plan for additional details.  Last clinic appointment: 03/03/2024: Discussed type 2 diabetes and hepatic cirrhosis   Last Pertinent Labs Documented:     Latest Ref Rng & Units 03/03/2024   11:54 AM 02/16/2023   11:05 AM 12/19/2021   11:16 AM  BMP  Glucose 70 - 99 mg/dL 897  95  837   BUN 6 - 24 mg/dL 7  13  13    Creatinine 0.57 - 1.00 mg/dL 9.35  9.38  9.34   BUN/Creat Ratio 9 - 23 11  21     Sodium 134 - 144 mmol/L 142  141  142   Potassium 3.5 - 5.2 mmol/L 3.8  3.9  4.1   Chloride 96 - 106 mmol/L 104  101  108   CO2 20 - 29 mmol/L 24  25  29    Calcium 8.7 - 10.2 mg/dL 9.4  9.2  9.8        Latest Ref Rng & Units 11/15/2022    8:49 AM 05/15/2022   10:19 AM 12/19/2021   11:16 AM  CBC  WBC 3.4 - 10.8 x10E3/uL 1.5  1.4  1.3   Hemoglobin 11.1 - 15.9 g/dL 88.1  88.2  88.9   Hematocrit 34.0 - 46.6 % 36.4  35.8  34.0   Platelets 150 - 450 x10E3/uL 35  Comment  33     Lab Results  Component Value Date   HGBA1C 7.7 (A) 10/13/2024   HGBA1C 8.1 (A) 03/03/2024   HGBA1C 8.4 (A) 11/05/2023     Past Medical History:  Diagnosis Date   Diabetes mellitus without complication (HCC)    Hepatic cirrhosis (HCC) 11/27/2014   Compensated, follows with GI and hepatology   Hepatitis C virus infection 01/04/2015   History of malaria    Multiple episodes w/ 2 hospitalizations   Hypertension    Pancytopenia (HCC)    Secondary to cirrhosis   Splenomegaly 11/27/2014   Secondary to cirrhosis   Wound of left foot 11/27/2019    Current Outpatient Medications on File Prior to Visit  Medication Sig Dispense  Refill   Dulaglutide  (TRULICITY ) 0.75 MG/0.5ML SOAJ Inject 0.75 mg into the skin once a week. 2 mL 3   furosemide  (LASIX ) 40 MG tablet Take 1 tablet (40 mg total) by mouth daily. 90 tablet 0   gabapentin  (NEURONTIN ) 300 MG capsule Take 1 capsule (300 mg total) by mouth at bedtime. 30 capsule 2   insulin  lispro (HUMALOG  KWIKPEN) 100 UNIT/ML KwikPen Inject 10 Units into the skin 2 (two) times daily. 15 mL 3   Insulin  Pen Needle (UNIFINE PENTIPS) 32G X 4 MM MISC Use to inject insulin  once daily 100 each 2   metFORMIN  (GLUCOPHAGE ) 500 MG tablet Take 1 tablet (500 mg total) by mouth daily with breakfast. 30 tablet 11   nadolol  (CORGARD ) 40 MG tablet Take 1 tablet (40 mg total) by mouth daily. 90 tablet 1   pantoprazole  (PROTONIX ) 40 MG tablet Take 1 tablet (40 mg total) by mouth daily. 90 tablet 3   No current facility-administered medications on file prior to visit.    No family history on file.  Social History  Socioeconomic History   Marital status: Married    Spouse name: Not on file   Number of children: Not on file   Years of education: Not on file   Highest education level: Not on file  Occupational History   Not on file  Tobacco Use   Smoking status: Never   Smokeless tobacco: Never  Vaping Use   Vaping status: Never Used  Substance and Sexual Activity   Alcohol use: No    Alcohol/week: 0.0 standard drinks of alcohol   Drug use: No   Sexual activity: Not on file  Other Topics Concern   Not on file  Social History Narrative   Moved from [place] in 09/2014 w/ husband and son. Previously worked as a visual merchandiser w/ cows, facilities manager, and pigs.    Social Drivers of Corporate Investment Banker Strain: Not on file  Food Insecurity: Low Risk  (04/06/2023)   Received from [facility]   Hunger Vital Sign    Within the past 12 months, you worried that your food would run out before you got money to buy more: Never true    Within the past 12 months, the food you bought just didn't last  and you didn't have money to get more. : Never true  Transportation Needs: No Transportation Needs (04/06/2023)   Received from [facility]    In the past 12 months, has lack of reliable transportation kept you from medical appointments, meetings, work or from getting things needed for daily living? : No  Physical Activity: Not on file  Stress: Not on file  Social Connections: Moderately Integrated (12/19/2022)   Social Connection and Isolation Panel    Frequency of Communication with Friends and Family: More than three times a week    Frequency of Social Gatherings with Friends and Family: More than three times a week    Attends Religious Services: More than 4 times per year    Active Member of [employer] or Organizations: No    Attends Banker Meetings: Never    Marital Status: Married  Catering Manager Violence: Not At Risk (12/19/2022)   Humiliation, Afraid, Rape, and Kick questionnaire    Fear of Current or Ex-Partner: No    Emotionally Abused: No    Physically Abused: No    Sexually Abused: No    Review of Systems:  Denies: SOB, CP, abdominal pain, abdominal swelling, leg swelling. Blood in stool, black stool, hematechezia, unintentional weight loss, prolonged periods of sitting, recent travel in plane or car, injury to LLE, history of blood clot. Endorses: Pain in left lower extremity for 2 weeks.  Pruritus of leg, taut skin of LLE  Vitals:   10/13/24 0851  BP: 122/66  Pulse: 73  Temp: 98 F (36.7 C)  TempSrc: Oral  SpO2: 100%  Weight: 219 lb 9.6 oz (99.6 kg)  Height: 5' 3 (1.6 m)    Physical Exam: Physical Exam Constitutional:      Appearance: She is not ill-appearing or toxic-appearing.  Cardiovascular:     Rate and Rhythm: Normal rate and regular rhythm.     Heart sounds: Murmur (Early systolic murmur grade 3/6 right upper sternal border.) heard.  Pulmonary:     Effort: Pulmonary effort is normal. No respiratory distress.     Breath  sounds: Normal breath sounds. No stridor. No wheezing, rhonchi or rales.  Abdominal:     General: Bowel sounds are normal.     Tenderness: There is no  abdominal tenderness. There is no guarding.     Comments: Protuberant  Musculoskeletal:     Right lower leg: No edema.     Left lower leg: No edema.     Comments: LLE: Skin taut, tender to palpation on anterior leg, shiny.  Negative for warmth or erythema.  Negative for edema.  Pulses not palpable bilaterally, however feet are large without swelling.  Feet are cool bilaterally.  Skin:    General: Skin is warm and dry.  Neurological:     Mental Status: She is alert.     Assessment & Plan:   Patient seen with Dr. Jeanelle  Overall, Patient reports that she is feeling well.  Assessment & Plan Tenderness of left lower extremity Physical Exam: Skin was taught, without wrinkling, along anterolateral Left LE; without edema. Hyperpigmentation present left along shin.  Patient had been complaining of pain to LLE for 2 weeks.  Ddx: left lower extremity DVT (would be unprovoked in setting of negative ROS for travel, prolonged stasis, injury, however there is hypercoagulable state in setting of hepatic cirrhosis; pulses were difficulty to palpate bilaterally and both feet were cool to touch, possibly in setting of clinic vs poor perfusion; possible portal vein thrombus noted in 2021), cellulitis (although no warmth or erythema, no fevers or chills. Patient is reporting pruritus; there is no obvious wound or broken skin present), Diabetic dermopathy (hyperpigmented well demarcated lesions), chronic venous insufficiency (heaviness/ache in legs however patient only endorsing pain in left leg, skin hyperpigmentation present).  Will want to rule out DVT. Will treat for pain control in meantime with Voltaren gel sent to pharmacy. Instructed to go to ED if experiences CP or SOB. BP and HR WNL. If LE US  negative, will re-evaluate.  - Left LE US  ordered to be  performed within the week.  - ABIs deferred at this time - Voltaren Gel instructed to use in meantime for pain.  Type 2 diabetes mellitus with hyperglycemia, without long-term current use of insulin  (HCC) Hyperlipidemia, unspecified hyperlipidemia type Lab Results  Component Value Date   HGBA1C 7.7 (A) 10/13/2024   HGBA1C 8.1 (A) 03/03/2024   HGBA1C 8.4 (A) 11/05/2023       Latest Ref Rng & Units 03/03/2024   11:54 AM 02/16/2023   11:05 AM 12/19/2021   11:16 AM  BMP  Glucose 70 - 99 mg/dL 897  95  837   BUN 6 - 24 mg/dL 7  13  13    Creatinine 0.57 - 1.00 mg/dL 9.35  9.38  9.34   BUN/Creat Ratio 9 - 23 11  21     Sodium 134 - 144 mmol/L 142  141  142   Potassium 3.5 - 5.2 mmol/L 3.8  3.9  4.1   Chloride 96 - 106 mmol/L 104  101  108   CO2 20 - 29 mmol/L 24  25  29    Calcium 8.7 - 10.2 mg/dL 9.4  9.2  9.8     Medications: Reported Humalog  10 units twice daily, Trulicity  0.75 mg once weekly.  Patient denied taking metformin  500 mg.  Blood Sugars at home: Checking once before meals, patient brought glucometer.  On review, majority around 120s. Sometimes around 140s.  Peak around 160s. Diet and Exercise: staying busy.  Diet consisting of corn meal, greens, fish, red meat once in a while.  Diabetic Counseling: met with Arland 2024, not interested in meeting with her again at this time.  Stated that she feels well to continue current regimen.  Opthalmology: 2025 negative for retinopathy  Neuropathy: does not take gabapentin . No complaints of neuropathy  Lipids: 02/16/2023: LDL 111, HDL 35. Last microalbumin:Cr ratio WNL. The 10-year ASCVD risk score (Arnett DK, et al., 2019) is: 8.1%   Values used to calculate the score:     Age: 37 years     Clincally relevant sex: Female     Is Non-Hispanic African American: Yes     Diabetic: Yes     Tobacco smoker: No     Systolic Blood Pressure: 122 mmHg     Is BP treated: No     HDL Cholesterol: 35 mg/dL     Total Cholesterol: 160 mg/dL  J8R improved  from 8.1 to 7.7.  -Continue current diabetic regimen with Humalog  10 units twice daily and Trulicity  0.75 mg once weekly.  Consider SGLT2 at next office visit. - Consider starting statin at next visit in 3 months, deferring at this time with history of left lower extremity pain. Secondary esophageal varices without bleeding (HCC) Hepatic cirrhosis, unspecified hepatic cirrhosis type, unspecified whether ascites present [facility]) Imaging:  -RUQ US  03/10/2024 cholelithiasis without sonographic evidence of acute cholecystitis.  Cirrhotic liver without focal liver lesions identified.  -EGD 11/01/2021 gastric varices banding.  Labs:  - AFP 04/06/2023 = 5.1 - CBC 04/06/2023 = WBC 1.5, RBC 4.33, hemoglobin 11.6, hematocrit 35.0, MCV 81, RDW 14.3, platelets 33 - CMP 04/06/2023 = AST 25/ALT 22, Bilirubin total 0/5, albumin 4.4, Protein total 7.8  Recommendations per National complaints of cancer network and American Association of liver disease guidelines reviewed HCC screening with RUQ ultrasound and AFP every 6 months.  Will check CBC with history of hepatic cirrhosis, esophageal varices, iron deficiency anemia.  EGD recommendations included with history of varices every 1 to 2 years.  Plan:  - continue nadolol  40 mg daily  - continue Protonix  40 mg daily  - continue furosemide  40 mg  - Ordered: EGD, CMP, CBC, RUQ US , AFP Newly recognized murmur Newly recognized murmur appreciated grade 3/6 early systolic best heard over right upper sternal border with radiation to carotids.  Echo performed 01/2017 without evidence of aortic stenosis.  Previous physical exams have not mentioned murmur.  Patient has history of iron deficiency anemia, hepatic cirrhosis, esophageal varices.  Denies any new evidence of bleeding, however in the setting of new murmur differential diagnosis includes aortic stenosis versus murmur of anemia.  Patient is asymptomatic without chest pain, shortness of breath.  Patient reports overall  feeling well. -CBC ordered -Deferring echo at this time   Orders Placed This Encounter  Procedures   US  Abdomen Limited RUQ (LIVER/GB)    MCD HEALTHY BLUE ID: [id number] EPIC ORDER WT: 180 NO NEEDS/NOTHING ATTACHED PT AWARE OF $75 NO SHOW FEE & TO BRING PHOTO ID & INS CARD//BG W/ PT  PT AWARE TO BE NPO AFTER MIDNIGHT-MAY TAKE MEDS W/ SIP OF WATER    Standing Status:   Future    Expiration Date:   10/13/2025    Reason for Exam (SYMPTOM  OR DIAGNOSIS REQUIRED):   cirrosis    Preferred imaging location?:   [facility]   US  Venous Img Lower Unilateral Left    MCD HEALTH[facility] BLUE ID: [id number] EPIC ORDER WT: 180 NO NEEDS/NOTHING ATTACHED PT AWARE OF $75 NO SHOW FEE & TO BRING PHOTO ID & INS CARD//BG W/ PT    Standing Status:   Future    Expected Date:   10/20/2024    Expiration Date:  10/13/2025    Reason for Exam (SYMPTOM  OR DIAGNOSIS REQUIRED):   possible DVT    Preferred imaging location?:   [facility]   Glucose, capillary   CBC no Diff   Comprehensive metabolic panel with GFR   AFP Tumor Marker   Ambulatory referral to Gastroenterology    Referral Priority:   Routine    Referral Type:   Consultation    Referral Reason:   Specialty Services Required    Number of Visits Requested:   1   POC Hbg A1C     Talya Quain, D.O. [facility] Internal Medicine, PGY-1 Date 10/13/2024 Time 2:53 PM

## 2024-10-14 ENCOUNTER — Ambulatory Visit: Payer: Self-pay

## 2024-10-14 DIAGNOSIS — R011 Cardiac murmur, unspecified: Secondary | ICD-10-CM

## 2024-10-14 LAB — COMPREHENSIVE METABOLIC PANEL WITH GFR
ALT: 13 IU/L (ref 0–32)
AST: 19 IU/L (ref 0–40)
Albumin: 4.6 g/dL (ref 3.8–4.9)
Alkaline Phosphatase: 57 IU/L (ref 49–135)
BUN/Creatinine Ratio: 12 (ref 9–23)
BUN: 8 mg/dL (ref 6–24)
Bilirubin Total: 0.5 mg/dL (ref 0.0–1.2)
CO2: 25 mmol/L (ref 20–29)
Calcium: 9.3 mg/dL (ref 8.7–10.2)
Chloride: 101 mmol/L (ref 96–106)
Creatinine, Ser: 0.66 mg/dL (ref 0.57–1.00)
Globulin, Total: 3.1 g/dL (ref 1.5–4.5)
Glucose: 92 mg/dL (ref 70–99)
Potassium: 3.8 mmol/L (ref 3.5–5.2)
Sodium: 140 mmol/L (ref 134–144)
Total Protein: 7.7 g/dL (ref 6.0–8.5)
eGFR: 104 mL/min/1.73 (ref >59)

## 2024-10-14 LAB — CBC
Hematocrit: 38.6 % (ref 34.0–46.6)
Hemoglobin: 11.9 g/dL (ref 11.1–15.9)
MCH: 26.1 pg — ABNORMAL LOW (ref 26.6–33.0)
MCHC: 30.8 g/dL — ABNORMAL LOW (ref 31.5–35.7)
MCV: 85 fL (ref 79–97)
Platelets: 38 x10E3/uL — CL (ref 150–450)
RBC: 4.56 x10E6/uL (ref 3.77–5.28)
RDW: 14.8 % (ref 11.7–15.4)
WBC: 1.6 x10E3/uL — CL (ref 3.4–10.8)

## 2024-10-14 LAB — AFP TUMOR MARKER: AFP, Serum, Tumor Marker: 5.5 ng/mL (ref 0.0–9.2)

## 2024-10-14 NOTE — Progress Notes (Signed)
 Internal Medicine Clinic Attending  I was physically present during the key portions of the resident provided service and participated in the medical decision making of patient's management care. I reviewed pertinent patient test results.  The assessment, diagnosis, and plan were formulated together and I agree with the documentation in the resident's note.  Jeanelle Layman CROME, MD

## 2024-10-15 ENCOUNTER — Other Ambulatory Visit (HOSPITAL_COMMUNITY): Payer: Self-pay

## 2024-10-15 ENCOUNTER — Other Ambulatory Visit: Payer: Self-pay

## 2024-10-20 ENCOUNTER — Other Ambulatory Visit: Payer: Self-pay

## 2024-10-22 ENCOUNTER — Ambulatory Visit
Admission: RE | Admit: 2024-10-22 | Discharge: 2024-10-22 | Disposition: A | Discharge status: Home / Self Care | Source: Ambulatory Visit | Attending: Internal Medicine | Admitting: Internal Medicine

## 2024-10-22 DIAGNOSIS — M79605 Pain in left leg: Secondary | ICD-10-CM | POA: Diagnosis not present

## 2024-10-22 DIAGNOSIS — K7460 Unspecified cirrhosis of liver: Secondary | ICD-10-CM

## 2024-10-22 DIAGNOSIS — K8020 Calculus of gallbladder without cholecystitis without obstruction: Secondary | ICD-10-CM | POA: Diagnosis not present

## 2024-10-27 NOTE — Telephone Encounter (Signed)
 Discussed with patient lower extremity ultrasound (no clot visualized) and RUQ ultrasound (unchanged from previous).  Patient had no further questions at this time.

## 2024-10-29 ENCOUNTER — Ambulatory Visit (HOSPITAL_COMMUNITY)

## 2024-11-12 ENCOUNTER — Ambulatory Visit (HOSPITAL_COMMUNITY)
Admission: RE | Admit: 2024-11-12 | Discharge: 2024-11-12 | Disposition: A | Source: Ambulatory Visit | Attending: Family Medicine | Admitting: Family Medicine

## 2024-11-12 DIAGNOSIS — E119 Type 2 diabetes mellitus without complications: Secondary | ICD-10-CM | POA: Diagnosis not present

## 2024-11-12 DIAGNOSIS — R011 Cardiac murmur, unspecified: Secondary | ICD-10-CM

## 2024-11-12 DIAGNOSIS — I10 Essential (primary) hypertension: Secondary | ICD-10-CM | POA: Diagnosis not present

## 2024-11-12 DIAGNOSIS — I351 Nonrheumatic aortic (valve) insufficiency: Secondary | ICD-10-CM | POA: Diagnosis not present

## 2024-11-12 LAB — ECHOCARDIOGRAM COMPLETE
Area-P 1/2: 2.91 cm2
Calc EF: 56 %
P 1/2 time: 857 ms
S' Lateral: 3.2 cm
Single Plane A2C EF: 57.7 %
Single Plane A4C EF: 56.6 %

## 2024-11-18 ENCOUNTER — Other Ambulatory Visit: Payer: Self-pay

## 2024-11-18 ENCOUNTER — Other Ambulatory Visit: Payer: Self-pay | Admitting: Internal Medicine

## 2024-11-18 ENCOUNTER — Other Ambulatory Visit: Payer: Self-pay | Admitting: Student

## 2024-11-18 DIAGNOSIS — R600 Localized edema: Secondary | ICD-10-CM

## 2024-11-18 DIAGNOSIS — K7460 Unspecified cirrhosis of liver: Secondary | ICD-10-CM

## 2024-11-18 MED ORDER — NADOLOL 40 MG PO TABS
40.0000 mg | ORAL_TABLET | Freq: Every day | ORAL | 1 refills | Status: AC
  Filled 2024-11-18: qty 90, 90d supply, fill #0
  Filled 2024-12-22: qty 90, 90d supply, fill #1

## 2024-11-18 MED ORDER — FUROSEMIDE 40 MG PO TABS
40.0000 mg | ORAL_TABLET | Freq: Every day | ORAL | 0 refills | Status: AC
  Filled 2024-11-18: qty 90, 90d supply, fill #0

## 2024-11-18 NOTE — Telephone Encounter (Signed)
 Medication sent to pharmacy

## 2024-11-19 ENCOUNTER — Other Ambulatory Visit: Payer: Self-pay

## 2024-11-29 ENCOUNTER — Ambulatory Visit: Payer: Self-pay

## 2024-12-22 ENCOUNTER — Other Ambulatory Visit: Payer: Self-pay
